# Patient Record
Sex: Male | Born: 1935 | Race: White | Hispanic: No | Marital: Married | Smoking: Former smoker
Health system: Southern US, Community
[De-identification: ages and names within clinical notes are randomized; demographics above are authoritative.]

## PROBLEM LIST (undated history)

## (undated) DIAGNOSIS — N289 Disorder of kidney and ureter, unspecified: Secondary | ICD-10-CM

## (undated) DIAGNOSIS — I472 Ventricular tachycardia: Secondary | ICD-10-CM

## (undated) DIAGNOSIS — I77810 Thoracic aortic ectasia: Secondary | ICD-10-CM

## (undated) DIAGNOSIS — R413 Other amnesia: Secondary | ICD-10-CM

## (undated) DIAGNOSIS — I739 Peripheral vascular disease, unspecified: Secondary | ICD-10-CM

## (undated) DIAGNOSIS — E785 Hyperlipidemia, unspecified: Secondary | ICD-10-CM

## (undated) DIAGNOSIS — R7989 Other specified abnormal findings of blood chemistry: Secondary | ICD-10-CM

## (undated) DIAGNOSIS — C3430 Malignant neoplasm of lower lobe, unspecified bronchus or lung: Principal | ICD-10-CM

## (undated) DIAGNOSIS — I10 Essential (primary) hypertension: Secondary | ICD-10-CM

## (undated) DIAGNOSIS — I2510 Atherosclerotic heart disease of native coronary artery without angina pectoris: Secondary | ICD-10-CM

## (undated) DIAGNOSIS — Z950 Presence of cardiac pacemaker: Secondary | ICD-10-CM

## (undated) DIAGNOSIS — IMO0002 Reserved for concepts with insufficient information to code with codable children: Secondary | ICD-10-CM

## (undated) DIAGNOSIS — G4733 Obstructive sleep apnea (adult) (pediatric): Secondary | ICD-10-CM

## (undated) DIAGNOSIS — D649 Anemia, unspecified: Secondary | ICD-10-CM

## (undated) DIAGNOSIS — IMO0001 Reserved for inherently not codable concepts without codable children: Secondary | ICD-10-CM

## (undated) DIAGNOSIS — I495 Sick sinus syndrome: Secondary | ICD-10-CM

## (undated) DIAGNOSIS — Q809 Congenital ichthyosis, unspecified: Secondary | ICD-10-CM

## (undated) DIAGNOSIS — I4891 Unspecified atrial fibrillation: Secondary | ICD-10-CM

## (undated) DIAGNOSIS — M1990 Unspecified osteoarthritis, unspecified site: Secondary | ICD-10-CM

## (undated) DIAGNOSIS — R739 Hyperglycemia, unspecified: Secondary | ICD-10-CM

## (undated) DIAGNOSIS — Z9289 Personal history of other medical treatment: Secondary | ICD-10-CM

## (undated) DIAGNOSIS — H269 Unspecified cataract: Secondary | ICD-10-CM

## (undated) DIAGNOSIS — Z7901 Long term (current) use of anticoagulants: Secondary | ICD-10-CM

## (undated) DIAGNOSIS — I4729 Other ventricular tachycardia: Secondary | ICD-10-CM

## (undated) DIAGNOSIS — I509 Heart failure, unspecified: Secondary | ICD-10-CM

## (undated) DIAGNOSIS — I499 Cardiac arrhythmia, unspecified: Secondary | ICD-10-CM

## (undated) DIAGNOSIS — E669 Obesity, unspecified: Secondary | ICD-10-CM

## (undated) DIAGNOSIS — Z8719 Personal history of other diseases of the digestive system: Secondary | ICD-10-CM

## (undated) DIAGNOSIS — K219 Gastro-esophageal reflux disease without esophagitis: Secondary | ICD-10-CM

## (undated) DIAGNOSIS — K5792 Diverticulitis of intestine, part unspecified, without perforation or abscess without bleeding: Secondary | ICD-10-CM

## (undated) DIAGNOSIS — K432 Incisional hernia without obstruction or gangrene: Secondary | ICD-10-CM

## (undated) DIAGNOSIS — J449 Chronic obstructive pulmonary disease, unspecified: Secondary | ICD-10-CM

## (undated) HISTORY — DX: Other ventricular tachycardia: I47.29

## (undated) HISTORY — DX: Reserved for concepts with insufficient information to code with codable children: IMO0002

## (undated) HISTORY — DX: Presence of cardiac pacemaker: Z95.0

## (undated) HISTORY — DX: Obstructive sleep apnea (adult) (pediatric): G47.33

## (undated) HISTORY — PX: CORONARY ARTERY BYPASS GRAFT: SHX141

## (undated) HISTORY — DX: Disorder of kidney and ureter, unspecified: N28.9

## (undated) HISTORY — DX: Other specified abnormal findings of blood chemistry: R79.89

## (undated) HISTORY — DX: Unspecified atrial fibrillation: I48.91

## (undated) HISTORY — DX: Obesity, unspecified: E66.9

## (undated) HISTORY — PX: OTHER SURGICAL HISTORY: SHX169

## (undated) HISTORY — DX: Sick sinus syndrome: I49.5

## (undated) HISTORY — DX: Ventricular tachycardia: I47.2

## (undated) HISTORY — DX: Hyperglycemia, unspecified: R73.9

## (undated) HISTORY — DX: Hyperlipidemia, unspecified: E78.5

## (undated) HISTORY — DX: Thoracic aortic ectasia: I77.810

## (undated) HISTORY — DX: Reserved for inherently not codable concepts without codable children: IMO0001

## (undated) HISTORY — DX: Other amnesia: R41.3

## (undated) HISTORY — DX: Incisional hernia without obstruction or gangrene: K43.2

## (undated) HISTORY — DX: Peripheral vascular disease, unspecified: I73.9

## (undated) HISTORY — DX: Diverticulitis of intestine, part unspecified, without perforation or abscess without bleeding: K57.92

## (undated) HISTORY — DX: Anemia, unspecified: D64.9

## (undated) HISTORY — DX: Essential (primary) hypertension: I10

## (undated) HISTORY — PX: BACK SURGERY: SHX140

## (undated) HISTORY — PX: HERNIA REPAIR: SHX51

## (undated) HISTORY — DX: Long term (current) use of anticoagulants: Z79.01

## (undated) HISTORY — DX: Atherosclerotic heart disease of native coronary artery without angina pectoris: I25.10

## (undated) HISTORY — DX: Personal history of other medical treatment: Z92.89

## (undated) HISTORY — DX: Congenital ichthyosis, unspecified: Q80.9

## (undated) HISTORY — DX: Chronic obstructive pulmonary disease, unspecified: J44.9

---

## 1973-07-21 DIAGNOSIS — K5792 Diverticulitis of intestine, part unspecified, without perforation or abscess without bleeding: Secondary | ICD-10-CM

## 1973-07-21 HISTORY — DX: Diverticulitis of intestine, part unspecified, without perforation or abscess without bleeding: K57.92

## 1983-07-22 HISTORY — PX: COLON SURGERY: SHX602

## 2007-07-22 HISTORY — PX: CARDIAC CATHETERIZATION: SHX172

## 2008-02-11 ENCOUNTER — Encounter: Payer: Self-pay | Admitting: Internal Medicine

## 2008-02-19 HISTORY — PX: PACEMAKER INSERTION: SHX728

## 2008-03-01 ENCOUNTER — Ambulatory Visit: Payer: Self-pay | Admitting: Internal Medicine

## 2008-03-01 DIAGNOSIS — G4733 Obstructive sleep apnea (adult) (pediatric): Secondary | ICD-10-CM

## 2008-03-01 DIAGNOSIS — I4891 Unspecified atrial fibrillation: Secondary | ICD-10-CM | POA: Insufficient documentation

## 2008-03-01 DIAGNOSIS — J449 Chronic obstructive pulmonary disease, unspecified: Secondary | ICD-10-CM | POA: Insufficient documentation

## 2008-03-06 ENCOUNTER — Encounter: Payer: Self-pay | Admitting: Internal Medicine

## 2008-03-07 ENCOUNTER — Encounter: Payer: Self-pay | Admitting: Internal Medicine

## 2008-03-08 ENCOUNTER — Inpatient Hospital Stay (HOSPITAL_COMMUNITY): Admission: AD | Admit: 2008-03-08 | Discharge: 2008-03-10 | Payer: Self-pay | Admitting: Interventional Cardiology

## 2008-03-10 ENCOUNTER — Encounter (INDEPENDENT_AMBULATORY_CARE_PROVIDER_SITE_OTHER): Payer: Self-pay | Admitting: *Deleted

## 2008-03-10 ENCOUNTER — Telehealth: Payer: Self-pay | Admitting: Internal Medicine

## 2008-03-11 ENCOUNTER — Inpatient Hospital Stay (HOSPITAL_COMMUNITY): Admission: EM | Admit: 2008-03-11 | Discharge: 2008-03-15 | Payer: Self-pay | Admitting: Emergency Medicine

## 2008-03-13 ENCOUNTER — Encounter (INDEPENDENT_AMBULATORY_CARE_PROVIDER_SITE_OTHER): Payer: Self-pay | Admitting: Cardiology

## 2008-03-13 ENCOUNTER — Ambulatory Visit: Payer: Self-pay | Admitting: Surgery

## 2008-03-14 ENCOUNTER — Ambulatory Visit: Payer: Self-pay | Admitting: Internal Medicine

## 2008-03-20 ENCOUNTER — Telehealth (INDEPENDENT_AMBULATORY_CARE_PROVIDER_SITE_OTHER): Payer: Self-pay | Admitting: *Deleted

## 2008-03-21 ENCOUNTER — Ambulatory Visit: Payer: Self-pay | Admitting: Internal Medicine

## 2008-03-21 DIAGNOSIS — R0602 Shortness of breath: Secondary | ICD-10-CM | POA: Insufficient documentation

## 2008-03-28 ENCOUNTER — Encounter: Payer: Self-pay | Admitting: Internal Medicine

## 2008-03-29 ENCOUNTER — Telehealth: Payer: Self-pay | Admitting: Internal Medicine

## 2008-04-04 ENCOUNTER — Ambulatory Visit (HOSPITAL_BASED_OUTPATIENT_CLINIC_OR_DEPARTMENT_OTHER): Admission: RE | Admit: 2008-04-04 | Discharge: 2008-04-04 | Payer: Self-pay | Admitting: Internal Medicine

## 2008-04-04 ENCOUNTER — Telehealth (INDEPENDENT_AMBULATORY_CARE_PROVIDER_SITE_OTHER): Payer: Self-pay | Admitting: *Deleted

## 2008-04-04 ENCOUNTER — Encounter: Payer: Self-pay | Admitting: Internal Medicine

## 2008-04-08 ENCOUNTER — Ambulatory Visit: Payer: Self-pay | Admitting: Internal Medicine

## 2008-04-28 ENCOUNTER — Ambulatory Visit: Payer: Self-pay | Admitting: Internal Medicine

## 2008-05-09 DIAGNOSIS — E785 Hyperlipidemia, unspecified: Secondary | ICD-10-CM

## 2008-05-15 ENCOUNTER — Encounter: Payer: Self-pay | Admitting: Internal Medicine

## 2008-06-05 ENCOUNTER — Ambulatory Visit: Payer: Self-pay | Admitting: Internal Medicine

## 2008-06-13 ENCOUNTER — Encounter: Payer: Self-pay | Admitting: Internal Medicine

## 2008-07-06 ENCOUNTER — Encounter: Payer: Self-pay | Admitting: Internal Medicine

## 2008-07-07 ENCOUNTER — Telehealth (INDEPENDENT_AMBULATORY_CARE_PROVIDER_SITE_OTHER): Payer: Self-pay | Admitting: *Deleted

## 2008-07-10 ENCOUNTER — Telehealth: Payer: Self-pay | Admitting: Internal Medicine

## 2008-07-10 ENCOUNTER — Ambulatory Visit: Payer: Self-pay | Admitting: Internal Medicine

## 2008-07-18 ENCOUNTER — Telehealth: Payer: Self-pay | Admitting: Internal Medicine

## 2008-07-19 ENCOUNTER — Telehealth: Payer: Self-pay | Admitting: Internal Medicine

## 2008-07-19 ENCOUNTER — Encounter: Payer: Self-pay | Admitting: Internal Medicine

## 2008-07-20 ENCOUNTER — Encounter: Payer: Self-pay | Admitting: Internal Medicine

## 2008-07-25 ENCOUNTER — Inpatient Hospital Stay (HOSPITAL_COMMUNITY): Admission: EM | Admit: 2008-07-25 | Discharge: 2008-07-27 | Payer: Self-pay | Admitting: Emergency Medicine

## 2008-07-29 ENCOUNTER — Encounter: Payer: Self-pay | Admitting: Internal Medicine

## 2008-08-15 ENCOUNTER — Encounter: Payer: Self-pay | Admitting: Internal Medicine

## 2008-10-10 ENCOUNTER — Encounter: Admission: RE | Admit: 2008-10-10 | Discharge: 2008-10-10 | Payer: Self-pay | Admitting: Gastroenterology

## 2008-10-20 ENCOUNTER — Encounter: Admission: RE | Admit: 2008-10-20 | Discharge: 2008-10-20 | Payer: Self-pay | Admitting: Gastroenterology

## 2008-12-04 ENCOUNTER — Ambulatory Visit: Payer: Self-pay | Admitting: Internal Medicine

## 2008-12-05 ENCOUNTER — Ambulatory Visit (HOSPITAL_BASED_OUTPATIENT_CLINIC_OR_DEPARTMENT_OTHER): Admission: RE | Admit: 2008-12-05 | Discharge: 2008-12-05 | Payer: Self-pay | Admitting: Otolaryngology

## 2008-12-05 ENCOUNTER — Encounter (INDEPENDENT_AMBULATORY_CARE_PROVIDER_SITE_OTHER): Payer: Self-pay | Admitting: Otolaryngology

## 2009-05-14 ENCOUNTER — Encounter: Payer: Self-pay | Admitting: Internal Medicine

## 2009-05-21 ENCOUNTER — Encounter: Payer: Self-pay | Admitting: Internal Medicine

## 2009-06-04 ENCOUNTER — Ambulatory Visit: Payer: Self-pay | Admitting: Internal Medicine

## 2009-07-30 ENCOUNTER — Encounter: Payer: Self-pay | Admitting: Internal Medicine

## 2009-07-31 ENCOUNTER — Encounter: Payer: Self-pay | Admitting: Internal Medicine

## 2009-08-12 ENCOUNTER — Telehealth: Payer: Self-pay | Admitting: Internal Medicine

## 2009-11-05 ENCOUNTER — Encounter: Payer: Self-pay | Admitting: Internal Medicine

## 2009-12-03 ENCOUNTER — Ambulatory Visit: Payer: Self-pay | Admitting: Internal Medicine

## 2009-12-03 DIAGNOSIS — E662 Morbid (severe) obesity with alveolar hypoventilation: Secondary | ICD-10-CM | POA: Insufficient documentation

## 2009-12-23 ENCOUNTER — Encounter: Payer: Self-pay | Admitting: Internal Medicine

## 2009-12-25 ENCOUNTER — Encounter: Payer: Self-pay | Admitting: Internal Medicine

## 2010-01-10 ENCOUNTER — Encounter: Payer: Self-pay | Admitting: Internal Medicine

## 2010-01-11 ENCOUNTER — Ambulatory Visit: Payer: Self-pay | Admitting: Internal Medicine

## 2010-05-15 ENCOUNTER — Ambulatory Visit: Payer: Self-pay | Admitting: Internal Medicine

## 2010-07-24 ENCOUNTER — Encounter (HOSPITAL_COMMUNITY)
Admission: RE | Admit: 2010-07-24 | Discharge: 2010-08-20 | Payer: Self-pay | Source: Home / Self Care | Attending: Internal Medicine | Admitting: Internal Medicine

## 2010-08-19 ENCOUNTER — Encounter: Payer: Self-pay | Admitting: Internal Medicine

## 2010-08-20 NOTE — Assessment & Plan Note (Signed)
Summary: rov 4 months///kp   Copy to:  Dr. Harvin Hazel, [place] Primary Provider/Referring Provider:  Pete Glatter  CC:  4 month follow up visit-COPD and Sleep Apnea. Breathing doing okay-SOB at times with activity; Using BIPAP machine every night and pressure is okay per pt..  History of Present Illness: Dec 03, 2009- COPD, OSA, CAD See appended note completion. He says he is using BiPAP with oxygen every night and sleeps well with it. He challenges need for oxygen during the day. He notrices dyspnea only with sustained exertion. Denies any cough or wheeze, chest pain, palpitation or syncope.  January 11, 2010- COPD, OSA, CAD Feels breathing has been a little better.  continues bipap regularly- helps and is comfortable.Back pain limitis his walking. PFT- Mod obstruction, insig response to dilator. FEV1/FVC 0.59; FEV1 1.35/ 58% - 92%, 91%, 96% 288 meters, complained of back pain and SOB. CXR- 5/16- Stable, with pacemaker, NAD  May 15, 2010- COPD, OSA, CAD/ pacer............................Marland Kitchenwife here Nurse-CC: 4 month follow up visit-COPD and Sleep Apnea. Breathing doing okay-SOB at times with activity; Using BIPAP machine every night, pressure is okay per pt. He feels he is getting along ok. Admits he is overweight and that he lacks the initiative to do much about it. Just had Echo and Nuclear Stress Test by Dr Mayford Knife. They understand results were acceptable. He gave out easily and needed to pace himself recently unloading the car. Had flu vax.    Preventive Screening-Counseling & Management  Alcohol-Tobacco     Smoking Status: quit     Year Quit: 1994  Current Medications (verified): 1)  Symbicort 160-4.5 Mcg/act  Aero (Budesonide-Formoterol Fumarate) .... 2 Puffs Two Times A Day and Rinse Mouth 2)  Spiriva Handihaler 18 Mcg  Caps (Tiotropium Bromide Monohydrate) .... Inhale 1 Capsule Once Daily 3)  Bipap 11/9 [facility] 4)  Lipitor 20 Mg Tabs (Atorvastatin Calcium) .... Take 1  By Mouth Once Daily 5)  Coumadin 2.5 Mg  Tabs (Warfarin Sodium) .... Take As Directed 6)  Furosemide 80 Mg  Tabs (Furosemide) .... Take 1 By Mouth in The Am 7)  Klor-Con 10 10 Meq  Cr-Tabs (Potassium Chloride) .... Take 1 By Mouth Once Daily 8)  Sotalol Hcl 120 Mg Tabs (Sotalol Hcl) .... Take 1 By Mouth Once Daily 9)  Cardizem La 180 Mg Xr24h-Tab (Diltiazem Hcl Coated Beads) .... Take 1 Tablet By Mouth Once A Day 10)  Oxygen 2 L/m Continuous and Portable-  [facility]. 11)  Nebulizer For [employer] .... As Directed 12)  Protonix 40 Mg Tbec (Pantoprazole Sodium) .... Take 1 By Mouth Once Daily 13)  Trilipix 45 Mg Cpdr (Choline Fenofibrate) .... Take 1 By Mouth Once Daily 14)  Ferretts 325 (106 Fe) Mg Tabs (Ferrous Fumarate) .... Take 1 By Mouth Once Daily 15)  Vitamin C 500 Mg Tabs (Ascorbic Acid) .... Take 1 By Mouth Once Daily  Allergies (verified): No Known Drug Allergies  Past History:  Past Medical History: Last updated: 01/11/2010 Sleep Apnea- 04/04/08 NPSG AHI 14.5, 3 L/M suppl O2 for nadir 86% Atrial Fibrillation/ coumadin, pacemaker Diverticulitis 1975 Coronary Heart Disease Hypertension COPD   FEV1 1.35/ 58% 01/11/10 Hyperlipidemia  Past Surgical History: Last updated: 03/01/2008 Broken nose CABG 1975, 1994  Family History: Last updated: 17-Dec-2008 Father- died cancer, arrested TB Mother- ASCVD  Social History: Last updated: 2008/12/17 Patient states former smoker.  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  The patient denies shortness of breath at rest, productive cough, non-productive cough, coughing up blood, chest pain, irregular heartbeats, acid heartburn, indigestion, loss of appetite, weight change, abdominal pain, difficulty swallowing, sore throat, tooth/dental problems, headaches, nasal  congestion/difficulty breathing through nose, sneezing, itching, ear ache, anxiety, rash, change in color of mucus, and fever.    Vital Signs:  Patient profile:   77 year old male Height:      67 inches Weight:      249.50 pounds BMI:     39.22 O2 Sat:      96 % on Room air Pulse rate:   63 / minute BP sitting:   124 / 62  (left arm) Cuff size:   large  Vitals Entered By: Reynaldo Minium CMA (May 15, 2010 9:16 AM)  O2 Flow:  Room air CC: 4 month follow up visit-COPD and Sleep Apnea. Breathing doing okay-SOB at times with activity; Using BIPAP machine every night, pressure is okay per pt.   Physical Exam  Additional Exam:  General: A/Ox3; pleasant and cooperative, NAD,  significantly overweight, Asleep when I wlked into room- woke easily SKIN: icthyosis NODES: no lymphadenopathy HEENT: [place]/AT, EOM- WNL, Conjuctivae- clear, PERRLA, TM-hearing aids, Nose- clear, Throat- clear and wnl, Mallampati II-III NECK: Supple w/ fair ROM, JVD- none, normal carotid impulses w/o bruits Thyroid- CHEST: Clear to P&A, distant, slow expiration. Shallow with short sentences. No cough or wheeze. HEART: RRR, no m/g/r heard ABDOMEN: significant girth LFY:BOFB, nl pulses, 1+ edema  NEURO: Grossly intact to observation      Impression & Recommendations:  Problem # 1:  COPD (ICD-496) We discussed this in light of his recent PFT and cardiac tests.  He might benefit from Pulmonary Rehab and we will refer him.  Problem # 2:  SLEEP APNEA (ICD-780.57) He remains compliant and comfortable with his BIPAP plus oxygen at night and has used it a few times for daytime respite. Weight loss would really help.  Problem # 3:  OBESITY HYPOVENTILATION SYNDROME (ICD-278.8) Watching him- the mechanical impact of his large girth is obvious. We used this to build on potential benefit of rehab.   Medications Added to Medication List This Visit: 1)  Lipitor 20 Mg Tabs (Atorvastatin calcium) .... Take 1 by mouth once  daily 2)  Furosemide 80 Mg Tabs (Furosemide) .... Take 1 by mouth in the am 3)  Sotalol Hcl 120 Mg Tabs (Sotalol hcl) .... Take 1 by mouth once daily  Other Orders: Est. Patient Level IV (51025) Rehabilitation Referral (Rehab)  Patient Instructions: 1)  Please schedule a follow-up appointment in 6 months. 2)  See [facility] to discuss Pulmonary Rehab program at [facility].

## 2010-08-20 NOTE — Letter (Signed)
Summary: CMN for Oxygen/[facility]  CMN for Oxygen/[facility]   Imported By: Sherian Rein 12/27/2009 10:59:09  _____________________________________________________________________  External Attachment:    Type:   Image     Comment:   External Document

## 2010-08-20 NOTE — Medication Information (Signed)
Summary: Order for Nebulizer & Meds/[facility]  Order for Nebulizer & Meds/[facility]   Imported By: Sherian Rein 08/02/2009 14:13:35  _____________________________________________________________________  External Attachment:    Type:   Image     Comment:   External Document

## 2010-08-20 NOTE — Assessment & Plan Note (Signed)
Summary: ROV AFTER SMW/PFT ///kp   Copy to:  Dr. Harvin Hazel, [place] Primary Provider/Referring Provider:  Pete Glatter  CC:  Follow up visit-review and PFT.[name] Gill  History of Present Illness: June 04, 2009- COPD, OSA, CAD ........................................[name] Kitchenwife here Feels he is doing very well and denies acute issues through the summer. He only uses oxygen through his BiPAP for sleep and naps. He rarely used portable oxygen.With exertion he will get breathless, but recover quickly. Mild increase in clear phlegm over past month. Has macular degeration and we discussed Spiriva, but his opthalmologist hasn't expressed concern. he ra  Had flu vax. Wife denies snore through BiPAP. Sleep is restless, waking to bathroom every few hours. On quinine water that  helps leg cramps. Dry mouth despite humidifier.  Dec 03, 2009- COPD, OSA, CAD See appended note completion. He says he is using BiPAP with oxygen every night and sleeps well with it. He challenges need for oxygen during the day. He notrices dyspnea only with sustained exertion. Denies any cough or wheeze, chest pain, palpitation or syncope.  January 11, 2010- COPD, OSA, CAD Feels breathing has been a little better.  continues bipap regularly- helps and is comfortable.Back pain limitis his walking. PFT- Mod obstruction, insig response to dilator. FEV1/FVC 0.59; FEV1 1.35/ 58% - 92%, 91%, 96% 288 meters, complained of back pain and SOB. CXR- 5/16- Stable, with pacemaker, NAD     Preventive Screening-Counseling & Management  Alcohol-Tobacco     Smoking Status: quit     Year Quit: 1994  Current Medications (verified): 1)  Symbicort 160-4.5 Mcg/act  Aero (Budesonide-Formoterol Fumarate) .... 2 Puffs Two Times A Day and Rinse Mouth 2)  Spiriva Handihaler 18 Mcg  Caps (Tiotropium Bromide Monohydrate) .... Inhale 1 Capsule Once Daily 3)  Bipap 11/9 [facility] 4)  Lipitor 80 Mg Tabs (Atorvastatin Calcium) .... 1/2 Tab Once  Daily 5)  Coumadin 2.5 Mg  Tabs (Warfarin Sodium) .... Take As Directed 6)  Furosemide 80 Mg  Tabs (Furosemide) .... Take 1 By Mouth in The Am and 1/2 Tablet in The Pm 7)  Klor-Con 10 10 Meq  Cr-Tabs (Potassium Chloride) .... Take 1 By Mouth Once Daily 8)  Sotalol Hcl 120 Mg Tabs (Sotalol Hcl) .... Take 1 1/2 By Mouth Two Times A Day 9)  Cardizem La 180 Mg Xr24h-Tab (Diltiazem Hcl Coated Beads) .... Take 1 Tablet By Mouth Once A Day 10)  Oxygen 2 L/m Continuous and Portable-  [facility]. 11)  Nebulizer For [employer] .... As Directed 12)  Protonix 40 Mg Tbec (Pantoprazole Sodium) .... Take 1 By Mouth Once Daily 13)  Trilipix 45 Mg Cpdr (Choline Fenofibrate) .... Take 1 By Mouth Once Daily 14)  Ferretts 325 (106 Fe) Mg Tabs (Ferrous Fumarate) .... Take 1 By Mouth Once Daily 15)  Vitamin C 500 Mg Tabs (Ascorbic Acid) .... Take 1 By Mouth Once Daily  Allergies (verified): No Known Drug Allergies  Past History:  Past Surgical History: Last updated: 03/01/2008 Broken nose CABG 1975, 1994  Family History: Last updated: 18-Dec-2008 Father- died cancer, arrested TB Mother- ASCVD  Social History: Last updated: 12/18/08 Patient states former smoker.  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  The patient denies shortness of breath at rest, productive cough, non-productive cough, coughing up blood, chest pain, irregular heartbeats, acid heartburn, indigestion, loss of appetite, weight change, abdominal pain, difficulty swallowing, sore throat, tooth/dental problems, headaches, nasal  congestion/difficulty breathing through nose, and sneezing.    Vital Signs:  Patient profile:   77 year old male Height:      67 inches Weight:      250 pounds BMI:     39.30 O2 Sat:      90 % on Room air Pulse rate:   64 / minute  Vitals Entered By: Reynaldo Minium CMA (January 11, 2010 10:49 AM)  O2 Flow:  Room air CC: Follow up visit-review and PFT.   Physical Exam  Additional Exam:  General: A/Ox3; pleasant and cooperative, NAD,  significantly overweight, Asleep when I wlked into room- woke easily SKIN: icthyosis NODES: no lymphadenopathy HEENT: [place]/AT, EOM- WNL, Conjuctivae- clear, PERRLA, TM-hearing aids, Nose- clear, Throat- clear and wnl, Mallampati II-III NECK: Supple w/ fair ROM, JVD- none, normal carotid impulses w/o bruits Thyroid- CHEST: Clear to P&A, distant, slow expiration. Shallow with short sentences HEART: RRR, no m/g/r heard ABDOMEN: significant girth ZOX:WRUE, nl pulses, 1+ edema  NEURO: Grossly intact to observation      Impression & Recommendations:  Problem # 1:  COPD (ICD-496) Fixed obstructive disease with some emphysema.  Peripheral edema may reflect CHF, weight, peripheral venous insufficiency and probably some degree of pul htn   Problem # 2:  DYSPNEA (ICD-786.05) multifactorial with components of obesity and back pain limitiation. The primary problem is moderate fixed obstructive disease.  Problem # 3:  SLEEP APNEA (ICD-780.57)  Good compliance and control Noted his being asleep as i entered room and he said that the PFT and walking test "wore me out".  Medications Added to Medication List This Visit: 1)  Ferretts 325 (106 Fe) Mg Tabs (Ferrous fumarate) .... Take 1 by mouth once daily 2)  Vitamin C 500 Mg Tabs (Ascorbic acid) .... Take 1 by mouth once daily  Other Orders: Est. Patient Level IV (45409)  Patient Instructions: 1)  Please schedule a follow-up appointment in 4 months. 2)  Continue BiPAP and oxygen during sleep, and use  the oxygen as needed during the day 3)  Any weight loss will help make more room for your lungs to move.

## 2010-08-20 NOTE — Letter (Signed)
Summary: CMN/[facility]  CMN/[facility]   Imported By: Lester [place]wich 12/28/2009 09:22:19  _____________________________________________________________________  External Attachment:    Type:   Image     Comment:   External Document

## 2010-08-20 NOTE — Miscellaneous (Signed)
Summary: Orders Update pft charges  Clinical Lists Changes  Orders: Added new Service order of Carbon Monoxide diffusing w/capacity (94720) - Signed Added new Service order of Lung Volumes (94240) - Signed Added new Service order of Spirometry (Pre & Post) (94060) - Signed 

## 2010-08-20 NOTE — Assessment & Plan Note (Signed)
Summary: SIX MIN WALK- PULM STRESS TEST  Nurse Visit   Vital Signs:  Patient profile:   77 year old male Pulse rate:   58 / minute BP sitting:   120 / 70  Medications Prior to Update: 1)  Symbicort 160-4.5 Mcg/act  Aero (Budesonide-Formoterol Fumarate) .... 2 Puffs Two Times A Day and Rinse Mouth 2)  Spiriva Handihaler 18 Mcg  Caps (Tiotropium Bromide Monohydrate) .... Inhale 1 Capsule Once Daily 3)  Bipap 11/9 [facility] 4)  Lipitor 80 Mg Tabs (Atorvastatin Calcium) .... 1/2 Tab Once Daily 5)  Coumadin 2.5 Mg  Tabs (Warfarin Sodium) .... Take As Directed 6)  Furosemide 80 Mg  Tabs (Furosemide) .... Take 1 By Mouth in The Am and 1/2 Tablet in The Pm 7)  Klor-Con 10 10 Meq  Cr-Tabs (Potassium Chloride) .... Take 1 By Mouth Once Daily 8)  Sotalol Hcl 120 Mg Tabs (Sotalol Hcl) .... Take 1 1/2 By Mouth Two Times A Day 9)  Cardizem La 180 Mg Xr24h-Tab (Diltiazem Hcl Coated Beads) .... Take 1 Tablet By Mouth Once A Day 10)  Oxygen 2 L/m Continuous and Portable-  [facility]. 11)  Nebulizer For [employer] .... As Directed 12)  Protonix 40 Mg Tbec (Pantoprazole Sodium) .... Take 1 By Mouth Once Daily 13)  Trilipix 45 Mg Cpdr (Choline Fenofibrate) .... Take 1 By Mouth Once Daily  Allergies: No Known Drug Allergies  Orders Added: 1)  Pulmonary Stress (6 min walk) [94620]   Six Minute Walk Test Medications taken before test(dose and time): 1)  Symbicort 160-4.5 Mcg/act  Aero (Budesonide-Formoterol Fumarate) .... 2 Puffs Two Times A Day and Rinse Mouth 2)  Spiriva Handihaler 18 Mcg  Caps (Tiotropium Bromide Monohydrate) .... Inhale 1 Capsule Once Daily 4)  Lipitor 80 Mg Tabs (Atorvastatin Calcium) .... 1/2 Tab Once Daily 5)  Coumadin 2.5 Mg  Tabs (Warfarin Sodium) .... Take As Directed 6)  Furosemide 80 Mg  Tabs (Furosemide) .... Take 1 By Mouth in The Am and 1/2 Tablet in The Pm 7)  Klor-Con 10 10 Meq  Cr-Tabs (Potassium Chloride) .... Take 1 By Mouth Once Daily 8)  Sotalol Hcl 120 Mg Tabs  (Sotalol Hcl) .... Take 1 1/2 By Mouth Two Times A Day 9)  Cardizem La 180 Mg Xr24h-Tab (Diltiazem Hcl Coated Beads) .... Take 1 Tablet By Mouth Once A Day 11)  Nebulizer For [employer] .... As Directed 12)  Protonix 40 Mg Tbec (Pantoprazole Sodium) .... Take 1 By Mouth Once Daily 13)  Trilipix 45 Mg Cpdr (Choline Fenofibrate) .... Take 1 By Mouth Once Daily 14)  Vitamin C- (unknown dosage) 1 tab daily per pt today 15)  Iron 325mg  tab per pt today PT TOOK THESE MEDS AT 8:00AM TODAY Supplemental oxygen during the test: No  Lap counter(place a tick mark inside a square for each lap completed) lap 1 complete  lap 2 complete   lap 3 complete   lap 4 complete  lap 5 complete  lap 6 complete   Baseline  BP sitting: 120/ 70 Heart rate: 58 Dyspnea ( Borg scale) 0 Fatigue (Borg scale) 0 SPO2 92  End Of Test  BP sitting: 134/ 72 Heart rate: 100 Dyspnea ( Borg scale) 4 Fatigue (Borg scale) 3 SPO2 91  2 Minutes post  BP sitting: 122/ 70 Heart rate: 64 SPO2 96  Reason: Pt stopped for a 1 min. break- SOB/ back pain. Continued afterwards and completed test.  Interpretation: Number of laps  6 X  48 meters =   288 meters =    288 meters   Total distance walked in six minutes: 288 meters  Tech ID: Tivis Ringer, CNA (January 11, 2010 9:52 AM) Jeremy Johann Comments Pt took a 1 min break. c/o SOB/ back pain. He continued afterwards and completed test.

## 2010-08-20 NOTE — Letter (Signed)
Summary: CMN/[facility]  CMN/[facility]   Imported By: Lester [place] 11/14/2009 08:39:02  _____________________________________________________________________  External Attachment:    Type:   Image     Comment:   External Document

## 2010-08-20 NOTE — Letter (Signed)
Summary: CMN for Oxygen/[facility]  CMN for Oxygen/[facility]   Imported By: Sherian Rein 01/23/2010 13:59:56  _____________________________________________________________________  External Attachment:    Type:   Image     Comment:   External Document

## 2010-08-20 NOTE — Assessment & Plan Note (Signed)
Summary: rov 6 months///kp   Copy to:  Dr. Harvin Hazel, [place] Primary Provider/Referring Provider:  Pete Glatter  CC:  6 month follow up.  Pt states breathing is doing "very good."  Denies wheezing, chest tightness, and cough.  Pt states he is wearing bipap everynight for approx 5 1/2 hours each night.  Denies problem with mask and pressure.  [name]  History of Present Illness: 01-02-2009- COPD, OSA, CAD     Wife here Has Oxygen 2l, not used for short trips out of house and when inactive. Denies fever, purulent, bloody, chest pain, palpitation. For local anesth tomorrow to remove lump from cheek. Needs refill rescue inhaler. Asks about using nebulizer less, using rescue inhaler first as needed. Was hosp needing transfusion for anemia on coumadin and aspirin- source never found and counts rec  June 04, 2009- COPD, OSA, CAD ........................................Marland Kitchenwife here Feels he is doing very well and denies acute issues through the summer. He only uses oxygen through his BiPAP for sleep and naps. He rarely used portable oxygen.With exertion he will get breathless, but recover quickly. Mild increase in clear phlegm over past month. Has macular degeration and we discussed Spiriva, but his opthalmologist hasn't expressed concern. he ra  Had flu vax. Wife denies snore through BiPAP. Sleep is restless, waking to bathroom every few hours. On quinine water that  helps leg cramps. Dry mouth despite humidifier.  Dec 03, 2009- COPD, OSA, CAD He says he is using BiPAP with oxygen every night and sleeps well with it. He challenges need for oxygen during the day. He notrices dyspnea only with sustained exertion. Denies any cough or wheeze, chest pain, palpitation or syncope.    Current Medications (verified): 1)  Symbicort 160-4.5 Mcg/act  Aero (Budesonide-Formoterol Fumarate) .... 2 Puffs Two Times A Day and Rinse Mouth 2)  Spiriva Handihaler 18 Mcg  Caps (Tiotropium Bromide Monohydrate) .... Inhale  1 Capsule Once Daily 3)  Bipap 11/9 [facility] 4)  Lipitor 80 Mg Tabs (Atorvastatin Calcium) .... 1/2 Tab Once Daily 5)  Coumadin 2.5 Mg  Tabs (Warfarin Sodium) .... Take As Directed 6)  Furosemide 80 Mg  Tabs (Furosemide) .... Take 1 By Mouth in The Am and 1/2 Tablet in The Pm 7)  Klor-Con 10 10 Meq  Cr-Tabs (Potassium Chloride) .... Take 1 By Mouth Once Daily 8)  Sotalol Hcl 120 Mg Tabs (Sotalol Hcl) .... Take 1 1/2 By Mouth Two Times A Day 9)  Cardizem La 180 Mg Xr24h-Tab (Diltiazem Hcl Coated Beads) .... Take 1 Tablet By Mouth Once A Day 10)  Oxygen 2 L/m Continuous and Portable-  [facility]. 11)  Nebulizer For [employer] .... As Directed 12)  Protonix 40 Mg Tbec (Pantoprazole Sodium) .... Take 1 By Mouth Once Daily 13)  Trilipix 45 Mg Cpdr (Choline Fenofibrate) .... Take 1 By Mouth Once Daily  Allergies (verified): No Known Drug Allergies  Past History:  Past Medical History: Last updated: 07/29/2008 Sleep Apnea- 04/04/08 NPSG AHI 14.5, 3 L/M suppl O2 for nadir 86% Atrial Fibrillation/ coumadin, pacemaker Diverticulitis 1975 Coronary Heart Disease Hypertension COPD Hyperlipidemia  See appended document signed prematurely and appended to complete note.  Past Surgical History: Last updated: 03/01/2008 Broken nose CABG 1975, 1994  Family History: Last updated: 01/02/09 Father- died cancer, arrested TB Mother- ASCVD  Social History: Last updated: 02-Jan-2009 Patient states former smoker.  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    Vital Signs:  Patient profile:   77 year old  male Height:      67 inches Weight:      250 pounds BMI:     39.30 O2 Sat:      88 % on Room air Pulse rate:   77 / minute BP sitting:   124 / 68  (right arm) Cuff size:   regular  Vitals Entered By: Gweneth Dimitri RN (Dec 03, 2009 10:49 AM)  O2 Flow:  Room air  O2 Sat Comments Pt arrived to exam room with o2 sat 88% RA.  After resting for a few minutes, o2 sat increased to 98% RA with pulse of 68.  Gweneth Dimitri RN  Dec 03, 2009 10:50 AM  CC: 6 month follow up.  Pt states breathing is doing "very good."  Denies wheezing, chest tightness, cough.  Pt states he is wearing bipap everynight for approx 5 1/2 hours each night.  Denies problem with mask and pressure.   Comments Medications reviewed with patient Daytime contact number verified with patient. Gweneth Dimitri RN  Dec 03, 2009 10:52 AM    Physical Exam  Additional Exam:  General: A/Ox3; pleasant and cooperative, NAD,  significantly overweight,  SKIN: icthyosis NODES: no lymphadenopathy HEENT: [place]/AT, EOM- WNL, Conjuctivae- clear, PERRLA, TM-hearing aids, Nose- clear, Throat- clear and wnl, Mallampati II-III NECK: Supple w/ fair ROM, JVD- none, normal carotid impulses w/o bruits Thyroid- CHEST: Clear to P&A, distant, slow expiration. Shallow with short senteces- he is unaware. HEART: RRR, no m/g/r heard ABDOMEN: significant girth UEA:VWUJ, nl pulses, no edema  NEURO: Grossly intact to observation      Impression & Recommendations:  Problem # 1:  SLEEP APNEA (ICD-780.57)  Good compliance and control with BiPAP plus oxygen. Used every night all night.  Problem # 2:  DYSPNEA (ICD-786.05) Obesity hypoventilation. He is desensitized to hypoxia and doesn't recognize resting hypoxemia, although he arrived with O2 sat 88% on room air. We discussed oxygen therapy, goals and indications. We will update CXR, PFT and exercise oxygen evaluation.  Medications Added to Medication List This Visit: 1)  Lipitor 80 Mg Tabs  (Atorvastatin calcium) .... 1/2 tab once daily 2)  Sotalol Hcl 120 Mg Tabs (Sotalol hcl) .... Take 1 1/2 by mouth two times a day  Other Orders: Est. Patient Level III (99213) T-2 View CXR (71020TC) DME Referral (DME)  Patient Instructions: 1)  Please schedule a follow-up appointment in 1 month. 2)  We will update your oxygen order with [facility] to provide a more  portable oxygen supply. i think you should wear oxygen for sleep as you are doing, and also when you are active, up and exerting yourself. Set at 2 L/M. 3)  Weight loss is the single most imprtant thing you can do to get your breathing better. 4)  Schedule PFT with on room air 5)  A chest x-ray has been recommended.  Your imaging study may require preauthorization.    Immunization History:  Influenza Immunization History:    Influenza:  historical (05/21/2009)

## 2010-08-20 NOTE — Progress Notes (Signed)
Summary: Autotitrated BiPAP to 11/9  Phone Note Other Incoming   Summary of Call: BIPAP autotitration  from 1/4-1/10/11 indicates good compliance and suggests changing pressures from 13/5 to 11/9 might work better. This range gave AHI 1.6. We will have [facility] make that adjustment.  Follow-up for Phone Call        Pt is aware of autotitration report and is aware that an order has been sent to [facility] to change pressure of bipap. Pt is aware that once pressure has been changed to call us and let Dr. Maple Hudson know if he is having any problems tolerating pressure. Pt voiced understanding. Rhonda Cobb  August 14, 2009 9:00 AM     New/Updated Medications: * BIPAP 11/9 [facility]

## 2010-08-22 ENCOUNTER — Encounter (HOSPITAL_COMMUNITY): Payer: Medicare Other | Attending: Internal Medicine

## 2010-08-22 DIAGNOSIS — E678 Other specified hyperalimentation: Secondary | ICD-10-CM | POA: Insufficient documentation

## 2010-08-22 DIAGNOSIS — R0989 Other specified symptoms and signs involving the circulatory and respiratory systems: Secondary | ICD-10-CM | POA: Insufficient documentation

## 2010-08-22 DIAGNOSIS — I2510 Atherosclerotic heart disease of native coronary artery without angina pectoris: Secondary | ICD-10-CM | POA: Insufficient documentation

## 2010-08-22 DIAGNOSIS — E785 Hyperlipidemia, unspecified: Secondary | ICD-10-CM | POA: Insufficient documentation

## 2010-08-22 DIAGNOSIS — J449 Chronic obstructive pulmonary disease, unspecified: Secondary | ICD-10-CM | POA: Insufficient documentation

## 2010-08-22 DIAGNOSIS — R0609 Other forms of dyspnea: Secondary | ICD-10-CM | POA: Insufficient documentation

## 2010-08-22 DIAGNOSIS — J4489 Other specified chronic obstructive pulmonary disease: Secondary | ICD-10-CM | POA: Insufficient documentation

## 2010-08-22 DIAGNOSIS — Z5189 Encounter for other specified aftercare: Secondary | ICD-10-CM | POA: Insufficient documentation

## 2010-08-22 DIAGNOSIS — G4730 Sleep apnea, unspecified: Secondary | ICD-10-CM | POA: Insufficient documentation

## 2010-08-22 DIAGNOSIS — I4891 Unspecified atrial fibrillation: Secondary | ICD-10-CM | POA: Insufficient documentation

## 2010-08-27 ENCOUNTER — Encounter (HOSPITAL_COMMUNITY): Payer: Medicare Other

## 2010-08-29 ENCOUNTER — Encounter (HOSPITAL_COMMUNITY): Payer: Medicare Other

## 2010-09-02 ENCOUNTER — Telehealth (INDEPENDENT_AMBULATORY_CARE_PROVIDER_SITE_OTHER): Payer: Self-pay | Admitting: *Deleted

## 2010-09-03 ENCOUNTER — Encounter (HOSPITAL_COMMUNITY): Payer: Medicare Other

## 2010-09-05 ENCOUNTER — Encounter (HOSPITAL_COMMUNITY): Payer: Medicare Other

## 2010-09-05 NOTE — Letter (Signed)
Summary: [facility]   Imported By: Lester Wineglass 08/27/2010 09:29:01  _____________________________________________________________________  External Attachment:    Type:   Image     Comment:   External Document

## 2010-09-10 ENCOUNTER — Encounter (HOSPITAL_COMMUNITY): Payer: Medicare Other

## 2010-09-11 NOTE — Progress Notes (Signed)
Summary: wants dr youngs opinion regardeing copd study  Phone Note Call from Patient Call back at Home Phone [phone]   Caller: Patient Call For: Young Summary of Call: Patient phoned he received a letter from [employer] wanting to know if he is interested in a study for COPd and he wants to know if this is somethng that Dr Maple Hudson thinks he should participate in. He can be reached at [phone] Initial call taken by: Vedia Coffer,  September 02, 2010 10:07 AM  Follow-up for Phone Call        Pt states he received someinformation about participating in a COPD study through [employer]. Pt wante dto knwo if Dr. Maple Hudson had heard of this study and if he thought it is somethign the pt should participate in. Pt states the website with more info for the study is www.[employer]no.  Please advise. Carron Curie CMA  September 02, 2010 12:53 PM   Additional Follow-up for Phone Call Additional follow up Details #1::        It is ok to participate if he wants to.  Additional Follow-up by: [name] MD,  September 03, 2010 8:36 AM    Additional Follow-up for Phone Call Additional follow up Details #2::    Pt informed ok to partcipate in study per Dr Maple Hudson. Abigail Miyamoto RN  September 03, 2010 8:40 AM

## 2010-09-12 ENCOUNTER — Encounter (HOSPITAL_COMMUNITY): Payer: Medicare Other

## 2010-09-17 ENCOUNTER — Encounter (HOSPITAL_COMMUNITY): Payer: Medicare Other

## 2010-09-19 ENCOUNTER — Encounter (HOSPITAL_COMMUNITY): Payer: Medicare Other | Attending: Internal Medicine

## 2010-09-19 DIAGNOSIS — E678 Other specified hyperalimentation: Secondary | ICD-10-CM | POA: Insufficient documentation

## 2010-09-19 DIAGNOSIS — I2510 Atherosclerotic heart disease of native coronary artery without angina pectoris: Secondary | ICD-10-CM | POA: Insufficient documentation

## 2010-09-19 DIAGNOSIS — G4730 Sleep apnea, unspecified: Secondary | ICD-10-CM | POA: Insufficient documentation

## 2010-09-19 DIAGNOSIS — R0609 Other forms of dyspnea: Secondary | ICD-10-CM | POA: Insufficient documentation

## 2010-09-19 DIAGNOSIS — J4489 Other specified chronic obstructive pulmonary disease: Secondary | ICD-10-CM | POA: Insufficient documentation

## 2010-09-19 DIAGNOSIS — J449 Chronic obstructive pulmonary disease, unspecified: Secondary | ICD-10-CM | POA: Insufficient documentation

## 2010-09-19 DIAGNOSIS — I4891 Unspecified atrial fibrillation: Secondary | ICD-10-CM | POA: Insufficient documentation

## 2010-09-19 DIAGNOSIS — Z5189 Encounter for other specified aftercare: Secondary | ICD-10-CM | POA: Insufficient documentation

## 2010-09-19 DIAGNOSIS — E785 Hyperlipidemia, unspecified: Secondary | ICD-10-CM | POA: Insufficient documentation

## 2010-09-19 DIAGNOSIS — R0989 Other specified symptoms and signs involving the circulatory and respiratory systems: Secondary | ICD-10-CM | POA: Insufficient documentation

## 2010-09-24 ENCOUNTER — Encounter (HOSPITAL_COMMUNITY): Payer: Medicare Other

## 2010-09-26 ENCOUNTER — Encounter (HOSPITAL_COMMUNITY): Payer: Medicare Other

## 2010-10-01 ENCOUNTER — Encounter (HOSPITAL_COMMUNITY): Payer: Medicare Other

## 2010-10-03 ENCOUNTER — Encounter (HOSPITAL_COMMUNITY): Payer: Medicare Other

## 2010-10-08 ENCOUNTER — Encounter (HOSPITAL_COMMUNITY): Payer: Medicare Other

## 2010-10-10 ENCOUNTER — Encounter (HOSPITAL_COMMUNITY): Payer: Medicare Other

## 2010-10-15 ENCOUNTER — Other Ambulatory Visit: Payer: Self-pay | Admitting: Internal Medicine

## 2010-10-15 ENCOUNTER — Encounter (HOSPITAL_COMMUNITY): Payer: Medicare Other

## 2010-10-17 ENCOUNTER — Encounter (HOSPITAL_COMMUNITY): Payer: Medicare Other

## 2010-10-22 ENCOUNTER — Encounter (HOSPITAL_COMMUNITY): Payer: Medicare Other | Attending: Internal Medicine

## 2010-10-22 DIAGNOSIS — E678 Other specified hyperalimentation: Secondary | ICD-10-CM | POA: Insufficient documentation

## 2010-10-22 DIAGNOSIS — R0989 Other specified symptoms and signs involving the circulatory and respiratory systems: Secondary | ICD-10-CM | POA: Insufficient documentation

## 2010-10-22 DIAGNOSIS — I2510 Atherosclerotic heart disease of native coronary artery without angina pectoris: Secondary | ICD-10-CM | POA: Insufficient documentation

## 2010-10-22 DIAGNOSIS — J4489 Other specified chronic obstructive pulmonary disease: Secondary | ICD-10-CM | POA: Insufficient documentation

## 2010-10-22 DIAGNOSIS — J449 Chronic obstructive pulmonary disease, unspecified: Secondary | ICD-10-CM | POA: Insufficient documentation

## 2010-10-22 DIAGNOSIS — E785 Hyperlipidemia, unspecified: Secondary | ICD-10-CM | POA: Insufficient documentation

## 2010-10-22 DIAGNOSIS — Z5189 Encounter for other specified aftercare: Secondary | ICD-10-CM | POA: Insufficient documentation

## 2010-10-22 DIAGNOSIS — R0609 Other forms of dyspnea: Secondary | ICD-10-CM | POA: Insufficient documentation

## 2010-10-22 DIAGNOSIS — I4891 Unspecified atrial fibrillation: Secondary | ICD-10-CM | POA: Insufficient documentation

## 2010-10-22 DIAGNOSIS — G4730 Sleep apnea, unspecified: Secondary | ICD-10-CM | POA: Insufficient documentation

## 2010-10-24 ENCOUNTER — Encounter (HOSPITAL_COMMUNITY): Payer: Medicare Other

## 2010-10-29 ENCOUNTER — Encounter (HOSPITAL_COMMUNITY): Payer: Medicare Other

## 2010-10-31 ENCOUNTER — Encounter (HOSPITAL_COMMUNITY): Payer: Medicare Other

## 2010-11-04 LAB — CBC
HCT: 22.5 % — ABNORMAL LOW (ref 39.0–52.0)
HCT: 27.1 % — ABNORMAL LOW (ref 39.0–52.0)
Hemoglobin: 8.6 g/dL — ABNORMAL LOW (ref 13.0–17.0)
MCHC: 30 g/dL (ref 30.0–36.0)
MCHC: 31.6 g/dL (ref 30.0–36.0)
MCV: 75.7 fL — ABNORMAL LOW (ref 78.0–100.0)
MCV: 77.3 fL — ABNORMAL LOW (ref 78.0–100.0)
MCV: 78.2 fL (ref 78.0–100.0)
Platelets: 331 10*3/uL (ref 150–400)
RBC: 2.97 MIL/uL — ABNORMAL LOW (ref 4.22–5.81)
RBC: 3.4 MIL/uL — ABNORMAL LOW (ref 4.22–5.81)
RBC: 3.47 MIL/uL — ABNORMAL LOW (ref 4.22–5.81)
WBC: 7.4 10*3/uL (ref 4.0–10.5)
WBC: 7.4 10*3/uL (ref 4.0–10.5)

## 2010-11-04 LAB — CROSSMATCH: ABO/RH(D): O NEG

## 2010-11-04 LAB — COMPREHENSIVE METABOLIC PANEL
AST: 12 U/L (ref 0–37)
BUN: 40 mg/dL — ABNORMAL HIGH (ref 6–23)
CO2: 30 mEq/L (ref 19–32)
Calcium: 8.7 mg/dL (ref 8.4–10.5)
Chloride: 102 mEq/L (ref 96–112)
Creatinine, Ser: 2.04 mg/dL — ABNORMAL HIGH (ref 0.4–1.5)
GFR calc Af Amer: 38 mL/min — ABNORMAL LOW (ref >60)
GFR calc non Af Amer: 32 mL/min — ABNORMAL LOW (ref >60)
Glucose, Bld: 96 mg/dL (ref 70–99)
Total Bilirubin: 0.6 mg/dL (ref 0.3–1.2)

## 2010-11-04 LAB — DIFFERENTIAL
Basophils Absolute: 0.1 10*3/uL (ref 0.0–0.1)
Eosinophils Relative: 3 % (ref 0–5)
Lymphocytes Relative: 12 % (ref 12–46)
Monocytes Relative: 11 % (ref 3–12)
Neutrophils Relative %: 73 % (ref 43–77)

## 2010-11-04 LAB — APTT: aPTT: 43 seconds — ABNORMAL HIGH (ref 24–37)

## 2010-11-04 LAB — IRON AND TIBC
Iron: 12 ug/dL — ABNORMAL LOW (ref 42–135)
UIBC: 398 ug/dL

## 2010-11-04 LAB — BASIC METABOLIC PANEL
BUN: 33 mg/dL — ABNORMAL HIGH (ref 6–23)
CO2: 29 mEq/L (ref 19–32)
Chloride: 97 mEq/L (ref 96–112)
Chloride: 99 mEq/L (ref 96–112)
Creatinine, Ser: 1.96 mg/dL — ABNORMAL HIGH (ref 0.4–1.5)
GFR calc Af Amer: 40 mL/min — ABNORMAL LOW (ref >60)
GFR calc Af Amer: 43 mL/min — ABNORMAL LOW (ref >60)
GFR calc non Af Amer: 33 mL/min — ABNORMAL LOW (ref >60)
Potassium: 3.7 mEq/L (ref 3.5–5.1)
Potassium: 3.9 mEq/L (ref 3.5–5.1)

## 2010-11-04 LAB — FERRITIN: Ferritin: 9 ng/mL — ABNORMAL LOW (ref 22–322)

## 2010-11-04 LAB — PROTIME-INR
INR: 2.7 — ABNORMAL HIGH (ref 0.00–1.49)
Prothrombin Time: 31 seconds — ABNORMAL HIGH (ref 11.6–15.2)

## 2010-11-05 ENCOUNTER — Encounter (HOSPITAL_COMMUNITY): Payer: Medicare Other

## 2010-11-07 ENCOUNTER — Encounter (HOSPITAL_COMMUNITY): Payer: Medicare Other

## 2010-11-12 ENCOUNTER — Encounter (HOSPITAL_COMMUNITY): Payer: Medicare Other

## 2010-11-12 ENCOUNTER — Encounter: Payer: Self-pay | Admitting: Internal Medicine

## 2010-11-14 ENCOUNTER — Encounter (HOSPITAL_COMMUNITY): Payer: Medicare Other

## 2010-11-14 ENCOUNTER — Encounter: Payer: Self-pay | Admitting: Internal Medicine

## 2010-11-14 ENCOUNTER — Ambulatory Visit (INDEPENDENT_AMBULATORY_CARE_PROVIDER_SITE_OTHER): Payer: Medicare Other | Admitting: Internal Medicine

## 2010-11-14 DIAGNOSIS — E678 Other specified hyperalimentation: Secondary | ICD-10-CM

## 2010-11-14 DIAGNOSIS — J449 Chronic obstructive pulmonary disease, unspecified: Secondary | ICD-10-CM

## 2010-11-14 DIAGNOSIS — G4730 Sleep apnea, unspecified: Secondary | ICD-10-CM

## 2010-11-14 NOTE — Assessment & Plan Note (Signed)
Weight los is not easy for him, but we will continue to encourage exercise and diet prudence.

## 2010-11-14 NOTE — Patient Instructions (Signed)
I am pleased that Pulmonary Rehab helped. Talk with Jamesetta So about maintenance. An alternative could be the [employer] program at the [facility].   Please call if needed

## 2010-11-14 NOTE — Assessment & Plan Note (Signed)
We will continue present settings

## 2010-11-14 NOTE — Progress Notes (Signed)
  Subjective:    Patient ID: [name] Gill, male    DOB: 09-May-1926, 77 y.o.   MRN: [id number]  HPI 73 yoM followed for OSA and COPD complicated by CAD and AFib/ pacemaker.  with obesity hypoventilation syndrome. Last here May 15, 2010. Since then he finished Pulmonary Rehab and says it was life changing. Pacing himself he can now walk around the block with a few rest stops. We talked about ways to maintain the momentum. Otherwise quiet winter- had one respiratory infection- not bad. Heart ok- no new problems or changes by Dr Mayford Knife. FEV1 was 1.35/ 58% in 2011. Dr Pete Glatter did CXR during the winter.  He continues BIPAP 11/9 with O2 2 L/M, used at least 5-6 hours every night.  Denies routine cough, phlegm, chest pain, palpitation, bloody or purulent sputum.    Review of Systems See HPI Constitutional:   No weight loss, night sweats,  Fevers, chills, fatigue, lassitude. HEENT:   No headaches,  Difficulty swallowing,  Tooth/dental problems,  Sore throat,                No sneezing, itching, ear ache, nasal congestion, post nasal drip,   CV:  No chest pain,  Orthopnea, PND, swelling in lower extremities, anasarca, dizziness, palpitations  GI  No heartburn, indigestion, abdominal pain, nausea, vomiting, diarrhea, change in bowel habits, loss of appetite  Resp: No excess mucus, no productive cough,  No non-productive cough,  No coughing up of blood.  No change in color of mucus.  No wheezing.    Skin: no rash or lesions.  GU: no dysuria, change in color of urine, no urgency or frequency.  No flank pain.  MS:  No joint pain or swelling.  No decreased range of motion.  No back pain.  Psych:  No change in mood or affect. No depression or anxiety.  No memory loss.      Objective:   Physical Exam General- Alert, Oriented, Affect-appropriate, Distress- none acute  Obese with prominent abdomen  Skin- rash-none, lesions- none, excoriation- none  Lymphadenopathy- none  Head-  atraumatic  Eyes- Gross vision intact, PERRLA, conjunctivae clearsecretions  Ears-  Hearing--aids,   Nose- Clear,  No- Septal dev, mucus, polyps, erosion, perforation   Throat- Mallampati II , mucosa clear , drainage- none, tonsils- atrophic. Upper denture, few remaining lower teeth  Neck- flexible , trachea midline, no stridor , thyroid nl, carotid no bruit  Chest -unlabored     Heart/CV- RRR , no murmur , no gallop  , no rub, nl s1 s2                     - JVD- 1+ , edema- tace at ankles, stasis changes- none, varices- none     Lung- clear to P&A, wheeze- none, cough- none , dullness-none, rub- none.  Shallow c/w obesity     Chest wall- pacemaker left  Abd- tender-no, distended-no, bowel sounds-present, HSM- no  Br/ Gen/ Rectal- Not done, not indicated  Extrem- cyanosis- none, clubbing, none, atrophy- none, strength- nl  Neuro- grossly intact to observation         Assessment & Plan:

## 2010-11-14 NOTE — Assessment & Plan Note (Signed)
Controlled. Improved after Pulmonary Rehab- He is going to talk with them about maintenance.

## 2010-11-19 ENCOUNTER — Encounter (HOSPITAL_COMMUNITY): Payer: Self-pay

## 2010-11-21 ENCOUNTER — Encounter (HOSPITAL_COMMUNITY): Payer: Self-pay

## 2010-11-26 ENCOUNTER — Encounter (HOSPITAL_COMMUNITY): Payer: Self-pay

## 2010-11-28 ENCOUNTER — Encounter (HOSPITAL_COMMUNITY): Payer: Self-pay

## 2010-12-03 ENCOUNTER — Encounter (HOSPITAL_COMMUNITY): Payer: Self-pay

## 2010-12-03 NOTE — Op Note (Signed)
NAMEMarland Gill  [name], [name] NO.:  [id number]   MEDICAL RECORD NO.:  [id number]          PATIENT TYPE:  INP   LOCATION:  5524                         FACILITY:  [facility]   PHYSICIAN:  Petra Kuba, M.D.    DATE OF BIRTH:  1925/10/29   DATE OF PROCEDURE:  07/26/2008  DATE OF DISCHARGE:                               OPERATIVE REPORT   PROCEDURE:  Esophagogastroduodenoscopy.   INDICATION:  Iron-deficiency anemia.  Consent was signed after risks,  benefits, methods, options thoroughly discussed yesterday with my PA and  prior to sedation today.   MEDICINES USED:  Fentanyl 50 mcg and Versed 4 mg.   PROCEDURE:  The video endoscope was inserted by direct vision.  The  esophagus was normal.  It did have a tiny to small hiatal hernia.  Scope  passed into the stomach.  No blood was seen.  There was a little  proximal fluid that needed washing and suctioning but no signs of  bleeding were seen.  The scope was advanced through a normal antrum,  normal pylorus into a normal duodenal bulb around the C-loop to a normal  second portion of the duodenum.  No blood was seen distally.  Scope was  withdrawn back to the bulb which again confirmed its normal appearance.  Scope was withdrawn back to the stomach and retroflexed.  Cardia,  fundus, angularis, lesser and greater curve were evaluated on retroflex  and straight visualization and other than some mild atrophic gastritis  no other obvious abnormalities were seen.  The patient did not tolerate  the procedure well, seemed to fight with [place] and we elected to stop the  procedure at this juncture instead of double check the duodenum.  Air  was suctioned.  Scope slowly withdrawn.  Again a good look at the  esophagus was normal.  Scope was removed.  The patient tolerated the  procedure fair.  There was no obvious immediate complication.   ENDOSCOPIC DIAGNOSES:  1. Tiny to small hiatal hernia.  2. No blood seen.  3. Minimal atrophic  gastritis.  4. Otherwise, normal EGD.   PLAN:  We need to find his old colonoscopy report for proceeding.  Re-  evaluate his Coumadin, plus or minus aspirin needs, not sure if we can  proceed with a capsule with his pacemaker but consider rechecking colon  or CAT scan to rule out anything __________.  We will follow clinically  on iron and repeat guaiacs just to see if further workup plans are  needed.           ______________________________  Petra Kuba, M.D.     MEM/MEDQ  D:  07/26/2008  T:  07/26/2008  Job:  [id number]   cc:   Hal T. Stoneking, M.D.

## 2010-12-03 NOTE — Op Note (Signed)
NAME:  [name], [name] NO.:  [id number]   MEDICAL RECORD NO.:  [id number]          PATIENT TYPE:  AMB   LOCATION:  DSC                          FACILITY:  [facility]   PHYSICIAN:  Kristine Garbe. Ezzard Standing, M.D.DATE OF BIRTH:  [birth year]   DATE OF PROCEDURE:  12/05/2008  DATE OF DISCHARGE:                               OPERATIVE REPORT   PREOPERATIVE DIAGNOSIS:  Basal cell carcinoma of the right cheek.   POSTOPERATIVE DIAGNOSIS:  Basal cell carcinoma of the right cheek.   OPERATION:  Excision of right cheek basal cell carcinoma (2 cm).   SURGEON:  Kristine Garbe. Ezzard Standing, MD   ANESTHESIA:  Local Xylocaine with epinephrine.   COMPLICATIONS:  None.   BRIEF CLINICAL NOTE:  [name] Gill is an 77 year old gentleman who had  had a previous right cheek basal cell carcinoma removed about 4-5 years  ago.  He now redeveloped a smaller nodule just posterior to where the  previous excision was performed.  On examination, the patient has a  approximately 2 cm firm nodule in the subcutaneous tissue just posterior  to the previous scar.  There is really no skin changes and I suspect  this represents a persistent or recurrent subcutaneous basal cell  carcinoma as the nodule is fairly firm.  He is taken to the operating  room at this time for excision of right cheek nodule consistent with  basal cell carcinoma.   DESCRIPTION OF PROCEDURE:  Right cheek area was prepped with Betadine  solution.  The nodule was marked out and then injected with 4 mL of  Xylocaine with epinephrine for local anesthetic.  Some of the overlying  skin and then the firm subcutaneous nodule was removed and sent to  pathology.  Hemostasis was obtained with the cautery and the small  defect was closed with 4-0 chromic sutures subcutaneously and a 6-0  nylon reapproximated the skin edges.  Bacitracin ointment was applied.  The patient tolerated the procedure well and is discharged home later  this morning.   We will have the patient follow up in my office next Monday for recheck.  Review pathology and have sutures removed.           ______________________________  Kristine Garbe Ezzard Standing, M.D.     CEN/MEDQ  D:  12/05/2008  T:  12/05/2008  Job:  [id number]   cc:   [name] Gill, M.D.

## 2010-12-03 NOTE — Discharge Summary (Signed)
NAMEYOAN, SALLADE NO.:  [id number]   MEDICAL RECORD NO.:  [id number]          PATIENT TYPE:  INP   LOCATION:  5524                         FACILITY:  [facility]   PHYSICIAN:  Hollice Espy, M.D.DATE OF BIRTH:  1925-11-11   DATE OF ADMISSION:  07/25/2008  DATE OF DISCHARGE:  07/27/2008                               DISCHARGE SUMMARY   PRIMARY CARE PHYSICIAN:  Dr. Ann Maki T. Stoneking.   CONSULTATIONS:  Dr. Petra Kuba, [facility].   CARDIOLOGIST:  Dr. Armanda Magic.   DISCHARGE DIAGNOSES:  1. Iron deficiency anemia.  2. Transfusion of 2 units of packed red blood cells.  3. Status post endoscopy, found to be unremarkable.  4. History of coronary artery disease, status post coronary artery      bypass graft surgery, status post pacemaker.  5. The patient has a reported history of diastolic dysfunction and      congestive heart failure, secondary to a cardiac catheterization      done in June 2009, with some preserved ejection fraction.  6. He has a history of O2 dependent chronic obstructive pulmonary      disease.  7. Hypertension.  8. Dyslipidemia.  9. Peripheral vascular disease.  10.Sleep apnea.   DISCHARGE MEDICATIONS:  1. The patient will resume all of his previous medications, with the      exception of aspirin.  This medication is being held for the next      two to three weeks, then he will resume this medicine.  2. New medication:  Iron 325 mg p.o. twice daily.  3. Protonix 40 mg p.o. daily.  4. MiraLax 17 grams p.o. daily over-the-counter.   PREVIOUS MEDICATIONS TO BE CONTINUED:  1. K-Dur 10 mEq p.o. daily.  2. Albuterol p.r.n.  3. Lasix 80 mg in the morning, 40 mg in the evening.  4. Lipitor 10 mg.  5. Sotalol 180 mg twice daily.  6. Cardizem 180 mg daily.  7. Tri-Chlor 145 mg.  8. Spiriva inhaled daily.  9. Symbicort two puffs twice daily.  10.Coumadin.   DISCHARGE DIET:  A heart-healthy diet.   DISCHARGE ACTIVITIES:   Slow to increase.   CONDITION ON DISCHARGE/DISPOSITION:  Improved.  The patient is being  discharged to home.   HISTORY:  The patient is an 77 year old white male with a past medical  history of extensive coronary artery disease, as listed above, who was  sent over from his cardiology, Dr. Gloris Manchester Turner's office, after he had  been complaining of increased shortness of breath.  When she checked his  labs, she found his hemoglobin to be 5.7.  The patient had no reports of  black tarry stools or bloody stools.  He was not hypotensive or  tachycardic; however, with that significant drop in hemoglobin, and the  fact that the patient is already on Coumadin, secondary to atrial  fibrillation, she was concerned about the possibility of a bleed.   HOSPITAL COURSE:  When the patient arrived at the emergency room he was  seen directly.  An MCV noted on his CBC was noted to  be around 75,  bringing up the possibility that  perhaps this was not a GI bleed but  rather an anemia.  [facility] was consulted, who saw the  patient.  The patient's INR remained therapeutic.  He underwent an EGD  on 07/26/2008.  His CBC was completely unremarkable, showing no signs of  any gastric ulcerations.  The patient's hemoglobin, status post  transfusion, improved to 8.4 and then by 07/27/2008, was 8.6.  At this  point he is medically stable for discharge.  In discussion with his GI,  it was felt that the patient could follow up with [facility] in two weeks'  time.  They recommended that he have a daily PPI on his regimen.  Hold  NSAID's for now, including his aspirin and otherwise could continue the  rest of his medications.  It was felt that it was iron deficiency anemia  and iron supplementation be given at this time.   PLAN:  1. To follow up with Dr. Ewing Schlein on 08/16/2008.  2. In the interim, he will follow up with his PCP, Dr. Pete Glatter in      one week's time.  At that time Dr. Pete Glatter can recheck  a      hemoglobin as well as follow up the patient's Coumadin levels, to      ensure that his INR is therapeutic.   The patient is being discharged to home.      Hollice Espy, M.D.  Electronically Signed     SKK/MEDQ  D:  07/27/2008  T:  07/27/2008  Job:  [id number]   cc:   Hal T. Stoneking, M.D.  Armanda Magic, M.D.  Petra Kuba, M.D.

## 2010-12-03 NOTE — Procedures (Signed)
NAME:  [name] Gill, [name] Gill NO.:  [id number]   MEDICAL RECORD NO.:  [id number]          PATIENT TYPE:  OUT   LOCATION:  SLEEP CENTER                 [facility]ACILITY:  [facility]   PHYSICIAN:  Clinton D. Maple Hudson, MD, FCCP, FACPDATE OF BIRTH:  November 05[birth year] 1927   DATE OF STUDY:  04/04/2008                            NOCTURNAL POLYSOMNOGRAM   REFERRING PHYSICIAN:   INDICATION FOR STUDY:  Hypersomnia with sleep apnea.   EPWORTH SLEEPINESS SCORE:  13/24, BMI 34.7, weight 225 pounds, height 67  inches, neck 18 inches.   HOME MEDICATION:  Charted and reviewed.   SLEEP ARCHITECTURE:  Split study protocol.  During the diagnostic phase,  total sleep time was 141 minutes with sleep efficiency 69.3%  Stage 1  was 4.3%, stage 2 was 74.8%, stage 3 absent, REM 20.9% of total sleep  time.  Sleep latency 7.5 minutes, REM latency 156 minutes, awake after  sleep onset 48 minutes, arousal index 15.7.  No bedtime medication was  taken.   RESPIRATORY DATA:  Split study protocol.  Apnea/hypopnea index (AHI)  14.5 per hour before CPAP.  A total of 34 events was scored including 25  obstructive apneas and 9 hypopneas.  Most events were nonsupine.  REM  AHI 54.9.  CPAP was titrated to 12 CWP, AHI zero per hour.  He chose a  medium ResMed Mirage Quattro mask with heated humidifier.   OXYGEN DATA:  Moderate snoring.  The patient wore oxygen at 3 L per  minute through the study as per home prescription.  Before CPAP, he  desaturated to a nadir of 86%.  After CPAP control, mean oxygen  saturation on 3 L prongs plus CPAP was 94.2%.   CARDIAC DATA:  Paced rhythm with occasional ventricular ectopic.   MOVEMENT/PARASOMNIA:  No significant movement disturbance.  Bathroom x3.   IMPRESSION/RECOMMENDATION:  1. Mild obstructive sleep apnea/hypopnea syndrome, apnea/hypopnea      index 14.5 per hour with events more common while supine.  Moderate      snoring with oxygen desaturation to a nadir of 86% while wearing   supplemental oxygen at 3 L per minute throughout this study.  2. Successful continuous positive airway pressure (CPAP) titration to      12 cm of water, apnea/hypopnea index zero per hour.  He chose a      medium ResMed Mirage Quattro full face mask with heated humidifier.      Clinton D. Maple Hudson, MD, [facility], FACP  Diplomate, Biomedical engineer of Sleep Medicine  Electronically Signed     CDY/MEDQ  D:  04/08/2008 10:22:58  T:  04/08/2008 14:33:56  Job:  [id number]

## 2010-12-03 NOTE — H&P (Signed)
NAME:  [name] Gill NO.:  [id number]   MEDICAL RECORD NO.:  [id number]          PATIENT TYPE:  INP   LOCATION:  3742                         FACILITY:  [facility]   PHYSICIAN:  Vesta Mixer, M.D. DATE OF BIRTH:  [birth year]   DATE OF ADMISSION:  03/11/2008  DATE OF DISCHARGE:                              HISTORY & PHYSICAL   [name] Gill is an 77 year old gentleman with a recent pacemaker  placement.  He is admitted today with a high fever.   [name] Gill is an 77 year old gentleman with a history of coronary  artery disease.  He moved here from [place] several months ago.   The patient has a history of CAD.  He is status post coronary artery  bypass grafting in 2004.  He has a history of atrial fibrillation and  sick sinus syndrome.  He had a pacemaker placed 2 days ago.  He has a  history of COPD and is O2 dependent.  He has a history of hypertension  and peripheral vascular disease, sleep apnea, and dyslipidemia.   The patient was admitted to the hospital on March 08, 2008, for sick  sinus syndrome.  His Coumadin was held and he had a pacemaker placed.  I  do not have records of that procedure.   The patient apparently tolerated it fairly well.  Yesterday, he had a  little bit of fever and this morning, he developed a fever of 101.5.  He  called me and we had him come to the ER for further evaluation.   Here in the emergency room, his temperature increased to 102.5.  He  developed a cough productive of fairly clear white sputum.  He will be  admitted for further evaluation.   The patient denies any dysuria or hematuria.  He denies any drainage  from his pacer pocket.  He denies any other problems.  His wife did  state that she saw a large swelling on the upper part of his right  thigh.  She was able to massage this area and the swelling went away.  It was fairly tender.  He has not had any worsening of his shortness of  breath.   CURRENT  MEDICATIONS:  1. Lipitor 10 mg a day.  2. TriCor 145 mg a day.  3. Lasix 80 mg a day.  4. Potassium chloride 10 mEq a day.  5. Metolazone 2.5 mg as needed.  6. BiPAP each night.  7. Aspirin 81 mg a day.  8. Symbicort once a day.  9. Coumadin 2.5 mg 5 days weekly and 5 mg 2 days a week.  10.Cardura 2 mg at bedtime.  11.Albuterol as needed.   ALLERGIES:  None.   PAST MEDICAL HISTORY:  1. Coronary artery disease - status post coronary artery bypass      grafting.  In 2004, he had a left internal mammary artery to the      LAD.  He had a radial artery graft (T-graft) to the obtuse marginal      artery #1, obtuse marginal artery #2, posterolateral branch and the  posterior descending artery.  2. Hypertension.  3. Hyperlipidemia.  4. COPD.  5. Sleep apnea.  6. Sick sinus syndrome/atrial fibrillation.  7. Peripheral vascular disease with known carotid artery stenosis.   SOCIAL HISTORY:  The patient used to smoke but quit several years ago.  He is a retired Clinical research associate.  He is married and has 4 children.  He drinks  beer occasionally.   His family history reveals no significant cardiac disease.   GENERAL:  On exam, he is an elderly gentleman in mild distress.  VITAL SIGNS:  His temperature is 102.5, heart rate 65, and blood  pressure is 110/70.  HEENT:  Carotids 2+.  LUNGS:  Bilateral wheezing.  I do not hear any rales.  HEART:  Regular rate.  S1 and S2.  ABDOMEN:  Good bowel sounds and is nontender.  EXTREMITIES:  He has no clubbing, cyanosis, or edema.   His pacer site appears to be normal.  There is no swelling and no  erythema.  There is no drainage.  He is quite warm all over, but the  pacer site does not appear to be any more warmth than the rest of them.   His urinalysis is unremarkable.  His white blood cell count is 7.3.  His  hemoglobin is 12.3.  His INR is 1.5.  His creatinine is up slightly to  2.1.   His chest x-ray reveals clear lungs.  He has a pacemaker  inserted in the  left subclavian area.  He has an atrial lead and a right ventricular  lead.  They appeared to be adequately placed.   IMPRESSION AND PLAN:  Fever, the patient presents with a fever 2 days  following an operation for pacemaker implantation.  It is quite possible  that he has a pacemaker infection.  I agree with his choice of  antibiotics.  We will need to look for other causes of fever.  On lung  exam, he has some wheezes, but on chest x-ray, I do not really see  any evidence of pneumonia.  His white blood cell count is normal which  is elevated, but perhaps it is too early for it to be elevated.  We will  admit him and have the medical doctors follow along with [place] in case  there are any other issues.      Vesta Mixer, M.D.  Electronically Signed     Vesta Mixer, M.D.  Electronically Signed    PJN/MEDQ  D:  03/11/2008  T:  03/12/2008  Job:  [id number]   cc:   Armanda Magic, M.D.  Francisca December, M.D.

## 2010-12-03 NOTE — Discharge Summary (Signed)
[name], [name] NO.:  [id number]   MEDICAL RECORD NO.:  [id number]          PATIENT TYPE:  INP   LOCATION:  3742                         FACILITY:  [facility]   PHYSICIAN:  Corky Crafts, MDDATE OF BIRTH:  Feb 25, 1926   DATE OF ADMISSION:  03/11/2008  DATE OF DISCHARGE:  03/15/2008                               DISCHARGE SUMMARY   DISCHARGE DIAGNOSES:  1. Fever resolved.  2. Sick sinus syndrome status post recent permanent pacemaker.  3. Peripheral vascular disease with known carotid artery stenosis.  4. Coronary artery disease status post bypass grafting:  Left internal      mammary artery to the left anterior descending, radial graft to the      obtuse marginal artery #1, obtuse marginal artery #2,      posterolateral branch, and posterior descending artery.  5. Hypertension.  6. Hyperlipidemia.  7. Chronic obstructive pulmonary disease.  8. Sleep apnea.  9. Atrial fibrillation.   HOSPITAL COURSE:  [name] Gill was admitted to the hospital on March 11, 2008, after a recent pacemaker implantation.  He was admitted with a  high fever.  His chest x-ray on admission showed mild central pulmonary  vascular congestion.  Ultimately, his blood cultures were negative.  He  had a C. difficile assay and this was negative.  We had Infectious  Disease physician, Dr. Cliffton Asters involved in the patient's care and  there was no obvious cause for his transient fevers.  His pacemaker site  showed no sign of infection.  On March 15, 2008, the patient was  discharged to home.   LABORATORY STUDIES:  During his stay are as above and include white  count 4.6, hemoglobin 11.9, hematocrit 35.6, and platelets 298.  Sodium  142, potassium 4.0, BUN 19, and creatinine 1.49.   DISCHARGE MEDICATIONS:  1. Lipitor 10 mg a day.  2. TriCor 145 mg a day.  3. Lasix 80 mg daily.  4. Potassium 10 mEq daily.  5. Baby aspirin daily.  6. Symbicort 1 puff twice a day.  7.  Coumadin 2.5 mg for 5 days, then 5 mg for 2 days.  8. Cardura 2 mg daily.  9. Albuterol 2 puffs p.r.n.  10.Spiriva 1 puff daily.   DISCHARGE INSTRUCTIONS:  Wound care and activity as prior to admission,  he is to remain on a low-sodium, heart-healthy diet.  Follow up in the  [facility] on March 21, 2008, at 11 a.m.  Follow up with Dr.  Mayford Knife on March 28, 2008, at 12:45 p.m.      [name] Gill, P.A.      Corky Crafts, MD  Electronically Signed    LB/MEDQ  D:  04/15/2008  T:  04/16/2008  Job:  [id number]

## 2010-12-03 NOTE — H&P (Signed)
[name], [name] Gill NO.:  [id number]   MEDICAL RECORD NO.:  [id number]          PATIENT TYPE:  INP   LOCATION:  1852                         FACILITY:  [facility]   PHYSICIAN:  Hollice Espy, M.D.DATE OF BIRTH:  [birth year]   DATE OF ADMISSION:  07/25/2008  DATE OF DISCHARGE:                              HISTORY & PHYSICAL   PRIMARY CARE PHYSICIAN:  Dr. Merlene Laughter   CONSULTANTS:  Dr. Petra Kuba, [facility]   CARDIOLOGIST:  Dr. Armanda Magic.   CHIEF COMPLAINT:  Anemia.   HISTORY OF PRESENT ILLNESS:  The patient is an 77 year old white male  with past medical history of CHF, CAD, paroxysmal atrial fibrillation on  chronic Coumadin, and iron deficiency anemia who was seen in the office  by his cardiologist, Dr. Armanda Magic.  She ordered some routine blood  work, and the patient was found to have a hemoglobin markedly low at  5.7.  He was also noted to have an MCV of 75.  The rest of his blood  work was not able to be obtained including a BMET or PT/INR, but given  his severe anemia, it was unclear if this was an actual true GI bleed or  if this was more secondary to chronic iron deficiency anemia that had  reached a critical threshold.  The patient was then sent over to the  emergency room as a direct admission.  When I saw the patient, he was  doing well.  He denied any headaches, vision changes or dysphagia, no  chest pain, no shortness of breath at rest, although he says, overall,  he has had significant dyspnea on exertion over the past few weeks even  with minor activities which is new for him.  He has had no abdominal  pain, no hematuria, dysuria, constipation, diarrhea, no black tarry  stools, no bright red blood per rectum.   REVIEW OF SYSTEMS:  Otherwise negative.   PAST MEDICAL HISTORY:  1. History of CAD.  2. Atrial fibrillation.  3. Fatigue.  4. Weakness.  5. Iron deficiency anemia.  6. Peripheral vascular disease.  7.  History of left external carotid artery stenosis.  8. Obesity.  9. Oxygen-dependent COPD.  10.Tachybrady syndrome, status post pacemaker.  11.History of CABG.   MEDICATIONS:  He is currently on the following medications:  1. K-Dur 10.  2. Albuterol p.r.n.  3. Aspirin 81.  4. Coumadin.  5. Lasix 80 in the morning, 40 in the evening.  6. Lipitor 10.  7. Sotalol 80 b.i.d.  8. Cardizem 180 daily.  9. Tricor 145.  10.Spiriva inhaled daily.  11.Symbicort 2 puffs b.i.d.   ALLERGIES:  He has NO KNOWN DRUG ALLERGIES.   SOCIAL HISTORY:  No tobacco, heavy alcohol or drug use.   FAMILY HISTORY:  Noncontributory.   PHYSICAL EXAMINATION:  VITAL SIGNS:  In the office, his blood pressure  was noted to 108/50 with a heart rate of 68.  Please note that he does  have a pacer.  Here in the ER, his temperature is 97.9, heart rate 60,  blood pressure 100/55,  respiratory rate 24, and O2 saturation 95% on  room air.  GENERAL:  He is alert and oriented x3, in no apparent distress.  HEENT: Normocephalic atraumatic.  His mucous membranes are slightly dry.  NECK:  He has no carotid bruits.  HEART:  Irregular rate and rhythm, S1, S2, 2/6 systolic ejection murmur.  LUNGS:  Decreased breath sounds throughout but secondary to his body  habitus.  ABDOMEN:  Soft, obese, nontender, positive bowel sounds.  EXTREMITIES:  Show no clubbing, cyanosis, about a trace of 1+ pitting  edema.   LABORATORY WORK:  I have ordered a CMET, coags, type and cross and iron  studies, all of which are pending.   ASSESSMENT AND PLAN:  1. Anemia, microcytic, question if this is a gastrointestinal bleed      versus iron deficiency anemia versus other.  We are going to hold      his Coumadin for now as well as his aspirin.  Transfuse 2 units      packed red blood cells slowly with 40 of Lasix in between.  I have      asked [facility] to come see the patient.  The patient      has had a previous colonoscopy done in  the past year, but this was      back when he lived in [place] and had it done there.  2. History of coronary artery disease, paroxysmal atrial fibrillation,      congestive heart failure, again holding Coumadin.  Continue to      follow.  Will hold his other medications.  3. Chronic obstructive pulmonary disease.  Will continue O2 at 2 L and      Spiriva.      Hollice Espy, M.D.  Electronically Signed     SKK/MEDQ  D:  07/25/2008  T:  07/25/2008  Job:  [id number]   cc:   Armanda Magic, M.D.  Hal T. Stoneking, M.D.  Petra Kuba, M.D.

## 2010-12-03 NOTE — Consult Note (Signed)
NAME:  [name] Gill, [name] Gill NO.:  [id number]   MEDICAL RECORD NO.:  [id number]          PATIENT TYPE:  INP   LOCATION:  5524                         FACILITY:  [facility]   PHYSICIAN:  Petra Kuba, M.D.    DATE OF BIRTH:  [birth year]   DATE OF CONSULTATION:  DATE OF DISCHARGE:                                 CONSULTATION   We were asked to see [name] Gill today in consultation for a hemoglobin  of 6.8 by Dr. Virginia Rochester of the [facility].   HISTORY OF PRESENT ILLNESS:  This is an 77 year old male with a recent  history of pacemaker placement in August 2009.  He also has a history of  partial colon resection some 40 years ago for diverticulitis.  He was  admitted to the hospital today with a hemoglobin of 6.8.  He reports his  drop in hemoglobin is surprising to him.  He has not seen any dark  stools, rather his stools are brownish-tannish color and once a day.  He  denies abdominal pain, vomiting, frank blood in his stool.  He does not  take any NSAIDs.  He does report that he has occasional indigestion and  sometimes will have soda get caught in his throat and has some  difficulty swallowing, but does not report any difficulty swallowing  food, such as bread or meat.  He also tells me that sometimes he has  swings in his INR, one such time was last week when his INR went up to  3.7, today it is 2.7.  He does have a history of GI bleeding some 5  years ago and he was changed from taking a 325 mg aspirin daily to  taking an 81 mg aspirin daily; however, he was not placed on iron and he  has not received a blood transfusion and he has no PPI coverage.  His  hemoglobin in August 2009 was 11.6, that appears to be his baseline.  He  tells me that he had a colonoscopy at [facility] in [place], [place], by Dr. Tonye Becket 1 year ago, during which he had 2 polyps removed.  Additionally in 2006, he had a colonoscopy and also had 2 polyps  removed.  He  reports that Dr. Laverle Hobby office has a copy of these op  notes and I have requested them.   Past medical history is significant for coronary artery disease, atrial  fibrillation, fatigue and weakness, iron deficiency anemia, peripheral  vascular disease.  Left external carotid artery stenosis.  He is O2  dependent.  He has COPD.  He is status post coronary artery bypass  graft.  He is status post pacemaker placement in 2009 from tachy-brady  syndrome.  He has a history of a hemicolectomy some 40 years ago from  diverticulitis.  He does have a history of colon polyps.   Current medications include an 81 mg aspirin, Coumadin, and the rest is  per chart.   He has no known drug allergies.   Review of systems is negative for fever, anorexia, or palpitations.  SOCIAL HISTORY:  He did have a 60-year tobacco history, however, quit  smoking about 10 years ago.  He tells me that he drinks 2 glasses of  wine a month on average.   Family history is negative for ulcer disease and colon cancer.   On physical exam, he is alert and oriented, pleasant to speak with.  His  temperature is 98.3, pulse 65, respirations are 20, blood pressure is  117/63.  Cardiovascular system has a regular rate with a systolic  murmur.  Lungs are clear to auscultation anteriorly.  Abdomen is obese,  soft, nontender, nondistended with good bowel sounds.  He does have a  large set of perpendicular scars, one he tells me was a hernia repair  and one was his hemicolectomy.   Current labs show a hemoglobin of 6.8 with an MCV value of 75.7,  hematocrit 22.5, white count 9.3 platelets 361,000.  BUN is 20,  creatinine 2.04.  LFTs are within normal limits.  PT is 31, PTT is 43,  INR is 2.7.   ASSESSMENT:  Dr. Vida Rigger has seen and examined the patient, collected  a history and reviewed his chart.  His impression is this is an 82-year-  old gentleman with anemia, probably a GI bleed.  We will plan to give IV  Protonix  and begin our evaluation with an upper endoscopy tomorrow  morning at 9:30 a.m.  We will also obtain the colon report from Dr.  Laverle Hobby office.   Thank you very much for this consultation.      Stephani Police, PA    ______________________________  Petra Kuba, M.D.    MLY/MEDQ  D:  07/25/2008  T:  07/26/2008  Job:  [id number]   cc:   Armanda Magic, M.D.  Hal T. Stoneking, M.D.  Petra Kuba, M.D.

## 2010-12-03 NOTE — H&P (Signed)
NAMEMarland Gill  [name], Gill NO.:  [id number]   MEDICAL RECORD NO.:  [id number]          PATIENT TYPE:  INP   LOCATION:  2007                         FACILITY:  [facility]   PHYSICIAN:  Lyn Records, M.D.   DATE OF BIRTH:  [birth year]   DATE OF ADMISSION:  03/08/2008  DATE OF DISCHARGE:                              HISTORY & PHYSICAL   REASON:  Tachybrady syndrome.   HISTORY OF PRESENT ILLNESS:  [name] Gill is a pleasant 77 year old  male patient with known history of coronary artery disease, recent  atrial fibrillation and hypertension.  He has recently moved to the  [place] area from [place].  Approximately 2 months ago, the patient  was admitted to the hospital for increased dyspnea.  He was found to be  in atrial fibrillation with rapid ventricular rate.  He was placed on  sotalol and anticoagulated.  He eventually converted to sinus rhythm.  The patient does have a history of coronary artery disease and is status  post CABG back in 2004.  He had a cardiac catheterization at the time of  his atrial fib onset that revealed no evidence of graft occlusion.  An  echo at that time revealed moderate left atrial enlargement, significant  LVH with normal EF.  He also had some mild mitral regurgitation.  He was  initially seen by Dr. Armanda Magic at the first of August, about the  time he moved to [place].  He was found to be in sinus bradycardia  with heart rate at 45 beats per minute.  His sotalol was decreased at  that time.  At a subsequent visit, his heart rate had decreased to 42  beats per minute.  His sotalol was stopped at that time and the patient  was placed on CardioNet monitor.  This has demonstrated in the last 24  hours multiple episodes of atrial fib with rates as high as 155.  He has  been asymptomatic with this without increase in shortness of breath,  palpitations, tachy arrhythmias or syncope.  He is being admitted at  this time for tachybrady  syndrome for heart rate management and  consideration of permanent pacemaker insertion.   REVIEW OF SYSTEMS:  GENERAL:  Has felt well.  No recent weight loss or  gain.  No fever or chills.  HEENT:  Wears hearing aids, wears glasses.  No problems with nasal obstruction, epistaxis, sore throat.  NECK:  No  stiffness.  CHEST:  As above.  Denies PND, orthopnea.  Denies cough or  wheezing.  He has COPD and is on chronic O2 therapy.  CARDIOVASCULAR:  Denies pain, pressure, tachy palpitations.  ABDOMEN:  Some nausea over  the last 24 hours.  No vomiting.  No problems with stools, including no  melena or hematochezia.  GENITOURINARY:  No dysuria, urinary frequency,  hematuria.  EXTREMITIES:  Denies any leg claudication symptoms.  No edema.  No  unusual joint stiffness.  NEUROLOGIC:  Denies numbness, tingling,  headache, seizures, memory loss, tremors, vertigo, lightheadedness or  syncope.   PAST MEDICAL HISTORY:  1. Atrial fibrillation with  rapid ventricular rate.  2. Sinus bradycardia, marked, on sotalol therapy.  3. Chronic Coumadin use.  4. Coronary artery disease, status post CABG, 2004, with recent cath      February 04, 2008, showing patent grafts and preserved LV function.  EF      65%.  5. COPD, O2 dependent, followed by Dr. Fannie Knee.  6. Hypertension.  7. Peripheral vascular disease with moderate stenosis in the left      carotid artery.  8. Sleep apnea, on BiPAP.  9. Dyslipidemia.   PAST SURGICAL HISTORY:  1. CABG x5, March 2004.      a.     LIMA to the LAD.      b.     Radial artery as a T graft to the OM1, OM2, PL circ and       posterior descending artery.  2. Hemicolectomy secondary to diverticulitis, 1972.  3. Multiple hernia surgeries.   CURRENT MEDICATIONS:  1. Lipitor 10 mg a day.  2. Tricor 145 mg daily.  3. Lasix 80 mg daily.  4. Potassium chloride 10 mEq daily.  5. Aspirin 81 mg daily.  6. Symbicort 160/4.5 one puff b.i.d.  7. Spiriva 1 puff p.o. daily.  8.  Coumadin 2.5 mg p.o. daily except 5 mg on Mondays and Thursdays.  9. Cardura 2 mg p.o. at h.s. daily.  10.Albuterol MDI 2 puffs p.o. q.i.d. p.r.n.  11.Metolazone 2.5 mg p.r.n.  12.O2 at 3 to 5 liters per minute via nasal cannula continuously.  13.BiPAP at h.s.   DRUG ALLERGIES:  None known.   SOCIAL HISTORY:  A retired Clinical research associate.  Married with 4 children.  Stopped  smoking 11 years ago after a multiple-pack-year history.  Occasionally  drinks 3 beers a week.   FAMILY HISTORY:  No family history of early coronary artery disease.   OBJECTIVE:  VITAL SIGNS:  Weight 232, pulse is 68, blood pressure is  100/60.  HEENT:  Ear, nose and throat unremarkable.  NECK:  Supple without lymphadenopathy.  No carotid bruits are  auscultated.  No JVD.  CHEST:  Good excursion, clear throughout without rales, wheezing or  rhonchi.  CARDIOVASCULAR:  Normal S1, S2, with a grade 2/6 murmur, unchanged.  No  rub auscultated.  ABDOMEN:  Soft and protuberant.  Bowel sounds present.  There are no  abdominal bruits appreciated.  Previous abdominal surgery scars.  No  tenderness, hepatosplenomegaly or masses.  EXTREMITIES:  No clubbing, cyanosis or edema.  There are +2 distal  pulses.  NEUROLOGIC:  Nonfocal.   EKG reveals sinus rhythm with PACs, rate at 80 beats per minute.   Review of ACT Ex monitor over the last 24 hours reveals multiple runs of  atrial fib with rapid ventricular rate lasting 60 seconds to 3 minutes  in length.   EKG today also reveals T-wave inversions in the lateral leads as well as  in leads V3 and V4 which are consistent with prior.   IMPRESSION:  1. Tachybrady syndrome.  2. Chronic Coumadin use.  3. History of coronary artery disease with recent cardiac      catheterization showing patent previous bypass grafts.  4. Normal ejection fraction.  5. Chronic obstructive pulmonary disease, oxygen dependent.  6. Sleep apnea.  7. Hyperlipidemia.  8. Hypertension.   PLAN:  We will  admit, start Cardizem drip for rate control, hold  Coumadin, and NPO after clear liquid breakfast in the morning for  permanent pacemaker insertion.  Dr. Katrinka Blazing and associates  to follow.      [name], N.P.      Lyn Records, M.D.  Electronically Signed    TCL/MEDQ  D:  03/08/2008  T:  03/08/2008  Job:  [id number]

## 2010-12-21 ENCOUNTER — Other Ambulatory Visit: Payer: Self-pay | Admitting: Internal Medicine

## 2011-01-21 ENCOUNTER — Encounter (HOSPITAL_COMMUNITY)
Admission: RE | Admit: 2011-01-21 | Discharge: 2011-01-21 | Disposition: A | Discharge status: Home / Self Care | Payer: Medicare Other | Source: Ambulatory Visit | Attending: Internal Medicine | Admitting: Internal Medicine

## 2011-01-21 DIAGNOSIS — J449 Chronic obstructive pulmonary disease, unspecified: Secondary | ICD-10-CM | POA: Insufficient documentation

## 2011-01-21 DIAGNOSIS — Z5189 Encounter for other specified aftercare: Secondary | ICD-10-CM | POA: Insufficient documentation

## 2011-01-21 DIAGNOSIS — J4489 Other specified chronic obstructive pulmonary disease: Secondary | ICD-10-CM | POA: Insufficient documentation

## 2011-01-21 DIAGNOSIS — G4730 Sleep apnea, unspecified: Secondary | ICD-10-CM | POA: Insufficient documentation

## 2011-01-21 DIAGNOSIS — R0989 Other specified symptoms and signs involving the circulatory and respiratory systems: Secondary | ICD-10-CM | POA: Insufficient documentation

## 2011-01-21 DIAGNOSIS — I4891 Unspecified atrial fibrillation: Secondary | ICD-10-CM | POA: Insufficient documentation

## 2011-01-21 DIAGNOSIS — I2510 Atherosclerotic heart disease of native coronary artery without angina pectoris: Secondary | ICD-10-CM | POA: Insufficient documentation

## 2011-01-21 DIAGNOSIS — E785 Hyperlipidemia, unspecified: Secondary | ICD-10-CM | POA: Insufficient documentation

## 2011-01-21 DIAGNOSIS — E678 Other specified hyperalimentation: Secondary | ICD-10-CM | POA: Insufficient documentation

## 2011-01-21 DIAGNOSIS — R0609 Other forms of dyspnea: Secondary | ICD-10-CM | POA: Insufficient documentation

## 2011-01-23 ENCOUNTER — Encounter (HOSPITAL_COMMUNITY): Payer: Medicare Other

## 2011-01-28 ENCOUNTER — Encounter (HOSPITAL_COMMUNITY): Payer: Medicare Other

## 2011-01-30 ENCOUNTER — Encounter (HOSPITAL_COMMUNITY): Payer: Medicare Other

## 2011-02-04 ENCOUNTER — Encounter (HOSPITAL_COMMUNITY): Payer: Medicare Other

## 2011-02-06 ENCOUNTER — Encounter (HOSPITAL_COMMUNITY): Payer: Medicare Other

## 2011-02-11 ENCOUNTER — Encounter (HOSPITAL_COMMUNITY): Payer: Medicare Other

## 2011-02-13 ENCOUNTER — Encounter (HOSPITAL_COMMUNITY): Payer: Medicare Other

## 2011-02-18 ENCOUNTER — Encounter (HOSPITAL_COMMUNITY): Payer: Medicare Other

## 2011-02-20 ENCOUNTER — Encounter (HOSPITAL_COMMUNITY): Payer: Medicare Other | Attending: Internal Medicine

## 2011-02-20 DIAGNOSIS — E678 Other specified hyperalimentation: Secondary | ICD-10-CM | POA: Insufficient documentation

## 2011-02-20 DIAGNOSIS — J449 Chronic obstructive pulmonary disease, unspecified: Secondary | ICD-10-CM | POA: Insufficient documentation

## 2011-02-20 DIAGNOSIS — J4489 Other specified chronic obstructive pulmonary disease: Secondary | ICD-10-CM | POA: Insufficient documentation

## 2011-02-20 DIAGNOSIS — I4891 Unspecified atrial fibrillation: Secondary | ICD-10-CM | POA: Insufficient documentation

## 2011-02-20 DIAGNOSIS — G4730 Sleep apnea, unspecified: Secondary | ICD-10-CM | POA: Insufficient documentation

## 2011-02-20 DIAGNOSIS — R0609 Other forms of dyspnea: Secondary | ICD-10-CM | POA: Insufficient documentation

## 2011-02-20 DIAGNOSIS — R0989 Other specified symptoms and signs involving the circulatory and respiratory systems: Secondary | ICD-10-CM | POA: Insufficient documentation

## 2011-02-20 DIAGNOSIS — E785 Hyperlipidemia, unspecified: Secondary | ICD-10-CM | POA: Insufficient documentation

## 2011-02-20 DIAGNOSIS — Z5189 Encounter for other specified aftercare: Secondary | ICD-10-CM | POA: Insufficient documentation

## 2011-02-20 DIAGNOSIS — I2510 Atherosclerotic heart disease of native coronary artery without angina pectoris: Secondary | ICD-10-CM | POA: Insufficient documentation

## 2011-02-25 ENCOUNTER — Encounter (HOSPITAL_COMMUNITY): Payer: Medicare Other

## 2011-02-27 ENCOUNTER — Encounter (HOSPITAL_COMMUNITY): Payer: Medicare Other

## 2011-03-04 ENCOUNTER — Encounter (HOSPITAL_COMMUNITY): Payer: Medicare Other

## 2011-03-06 ENCOUNTER — Encounter (HOSPITAL_COMMUNITY): Payer: Medicare Other

## 2011-03-11 ENCOUNTER — Encounter (HOSPITAL_COMMUNITY): Payer: Medicare Other

## 2011-03-13 ENCOUNTER — Encounter (HOSPITAL_COMMUNITY): Payer: Medicare Other

## 2011-03-18 ENCOUNTER — Encounter (HOSPITAL_COMMUNITY): Payer: Medicare Other

## 2011-03-20 ENCOUNTER — Encounter (HOSPITAL_COMMUNITY): Payer: Medicare Other

## 2011-03-25 ENCOUNTER — Encounter (HOSPITAL_COMMUNITY): Payer: Medicare Other | Attending: Internal Medicine

## 2011-03-25 DIAGNOSIS — G4730 Sleep apnea, unspecified: Secondary | ICD-10-CM | POA: Insufficient documentation

## 2011-03-25 DIAGNOSIS — J449 Chronic obstructive pulmonary disease, unspecified: Secondary | ICD-10-CM | POA: Insufficient documentation

## 2011-03-25 DIAGNOSIS — E785 Hyperlipidemia, unspecified: Secondary | ICD-10-CM | POA: Insufficient documentation

## 2011-03-25 DIAGNOSIS — Z5189 Encounter for other specified aftercare: Secondary | ICD-10-CM | POA: Insufficient documentation

## 2011-03-25 DIAGNOSIS — E678 Other specified hyperalimentation: Secondary | ICD-10-CM | POA: Insufficient documentation

## 2011-03-25 DIAGNOSIS — I4891 Unspecified atrial fibrillation: Secondary | ICD-10-CM | POA: Insufficient documentation

## 2011-03-25 DIAGNOSIS — J4489 Other specified chronic obstructive pulmonary disease: Secondary | ICD-10-CM | POA: Insufficient documentation

## 2011-03-25 DIAGNOSIS — R0609 Other forms of dyspnea: Secondary | ICD-10-CM | POA: Insufficient documentation

## 2011-03-25 DIAGNOSIS — I2510 Atherosclerotic heart disease of native coronary artery without angina pectoris: Secondary | ICD-10-CM | POA: Insufficient documentation

## 2011-03-25 DIAGNOSIS — R0989 Other specified symptoms and signs involving the circulatory and respiratory systems: Secondary | ICD-10-CM | POA: Insufficient documentation

## 2011-03-27 ENCOUNTER — Encounter (HOSPITAL_COMMUNITY): Payer: Medicare Other

## 2011-04-01 ENCOUNTER — Encounter (HOSPITAL_COMMUNITY): Payer: Medicare Other

## 2011-04-03 ENCOUNTER — Encounter (HOSPITAL_COMMUNITY): Payer: Medicare Other

## 2011-04-08 ENCOUNTER — Encounter (HOSPITAL_COMMUNITY): Payer: Medicare Other

## 2011-04-10 ENCOUNTER — Encounter (HOSPITAL_COMMUNITY): Payer: Medicare Other

## 2011-04-15 ENCOUNTER — Encounter (HOSPITAL_COMMUNITY): Payer: Medicare Other

## 2011-04-17 ENCOUNTER — Encounter (HOSPITAL_COMMUNITY): Payer: Medicare Other

## 2011-04-22 ENCOUNTER — Encounter (HOSPITAL_COMMUNITY): Payer: Medicare Other | Attending: Internal Medicine

## 2011-04-22 DIAGNOSIS — Z5189 Encounter for other specified aftercare: Secondary | ICD-10-CM | POA: Insufficient documentation

## 2011-04-22 DIAGNOSIS — E678 Other specified hyperalimentation: Secondary | ICD-10-CM | POA: Insufficient documentation

## 2011-04-22 DIAGNOSIS — J4489 Other specified chronic obstructive pulmonary disease: Secondary | ICD-10-CM | POA: Insufficient documentation

## 2011-04-22 DIAGNOSIS — E785 Hyperlipidemia, unspecified: Secondary | ICD-10-CM | POA: Insufficient documentation

## 2011-04-22 DIAGNOSIS — G4730 Sleep apnea, unspecified: Secondary | ICD-10-CM | POA: Insufficient documentation

## 2011-04-22 DIAGNOSIS — J449 Chronic obstructive pulmonary disease, unspecified: Secondary | ICD-10-CM | POA: Insufficient documentation

## 2011-04-22 DIAGNOSIS — R0989 Other specified symptoms and signs involving the circulatory and respiratory systems: Secondary | ICD-10-CM | POA: Insufficient documentation

## 2011-04-22 DIAGNOSIS — I2510 Atherosclerotic heart disease of native coronary artery without angina pectoris: Secondary | ICD-10-CM | POA: Insufficient documentation

## 2011-04-22 DIAGNOSIS — I4891 Unspecified atrial fibrillation: Secondary | ICD-10-CM | POA: Insufficient documentation

## 2011-04-22 DIAGNOSIS — R0609 Other forms of dyspnea: Secondary | ICD-10-CM | POA: Insufficient documentation

## 2011-04-24 ENCOUNTER — Encounter (HOSPITAL_COMMUNITY): Payer: Medicare Other

## 2011-04-29 ENCOUNTER — Encounter (HOSPITAL_COMMUNITY): Payer: Medicare Other

## 2011-04-29 ENCOUNTER — Other Ambulatory Visit: Payer: Self-pay | Admitting: *Deleted

## 2011-04-29 MED ORDER — BUDESONIDE-FORMOTEROL FUMARATE 160-4.5 MCG/ACT IN AERO: 2.0000 | INHALATION_SPRAY | Freq: Two times a day (BID) | RESPIRATORY_TRACT | Status: DC

## 2011-05-01 ENCOUNTER — Encounter (HOSPITAL_COMMUNITY): Payer: Medicare Other

## 2011-05-06 ENCOUNTER — Encounter (HOSPITAL_COMMUNITY): Payer: Medicare Other

## 2011-05-08 ENCOUNTER — Encounter (HOSPITAL_COMMUNITY): Payer: Medicare Other

## 2011-05-13 ENCOUNTER — Encounter (HOSPITAL_COMMUNITY): Payer: Medicare Other

## 2011-05-15 ENCOUNTER — Encounter (HOSPITAL_COMMUNITY): Payer: Medicare Other

## 2011-05-16 ENCOUNTER — Ambulatory Visit (INDEPENDENT_AMBULATORY_CARE_PROVIDER_SITE_OTHER): Payer: Medicare Other | Admitting: Internal Medicine

## 2011-05-16 ENCOUNTER — Encounter: Payer: Self-pay | Admitting: Internal Medicine

## 2011-05-16 VITALS — BP 122/74 | HR 69 | Ht 66.0 in | Wt 220.8 lb

## 2011-05-16 DIAGNOSIS — E678 Other specified hyperalimentation: Secondary | ICD-10-CM

## 2011-05-16 DIAGNOSIS — J449 Chronic obstructive pulmonary disease, unspecified: Secondary | ICD-10-CM

## 2011-05-16 DIAGNOSIS — G4730 Sleep apnea, unspecified: Secondary | ICD-10-CM

## 2011-05-16 DIAGNOSIS — J4489 Other specified chronic obstructive pulmonary disease: Secondary | ICD-10-CM

## 2011-05-16 NOTE — Progress Notes (Signed)
Patient ID: [name] Gill, male    DOB: 10/17/1925, 77 y.o.   MRN: [id number]  HPI 77 yoM followed for OSA and COPD complicated by CAD and AFib/ pacemaker.  with obesity hypoventilation syndrome. Last here May 15, 2010. Since then he finished Pulmonary Rehab and says it was life changing. Pacing himself he can now walk around the block with a few rest stops. We talked about ways to maintain the momentum. Otherwise quiet winter- had one respiratory infection- not bad. Heart ok- no new problems or changes by Dr Mayford Knife. FEV1 was 1.35/ 58% in 2011. Dr Pete Glatter did CXR during the winter.  He continues BIPAP 11/9 with O2 2 L/M, used at least 5-6 hours every night.  Denies routine cough, phlegm, chest pain, palpitation, bloody or purulent sputum.   05/16/11- 77 yoM followed for OSA and COPD complicated by CAD and AFib/ pacemaker, obesity hypoventilation syndrome. Wife is here. Had flu shot and shingles vaccine. He feels he is doing very well. Pulmonary rehabilitation was a big help and he has lost 28 pounds since April. He is now in the maintenance program. Comfortable off of oxygen in the daytime. Using Spiriva and Symbicort. Never aware of wheeze. Denies cough or chest pain. He has an old nebulizer machine and rescue inhaler that he never needs. He doesn't notice that he wheezes.  He continues BiPAP 11/9 with oxygen at 2 L, every night for at least 5 or 6 hours. His wife comments that he now stays asleep on long drives and she thinks he is doing very well.  Review of Systems See HPI Constitutional:   +Deliberate weight loss,   No-night sweats, fevers, chills, fatigue, lassitude. HEENT:   No-  headaches, difficulty swallowing, tooth/dental problems, sore throat,       No-  sneezing, itching, ear ache, nasal congestion, post nasal drip,  CV:  No-   chest pain, orthopnea, PND, swelling in lower extremities, anasarca, dizziness, palpitations Resp: No-   shortness of breath with exertion or at rest.                No-   productive cough,  No non-productive cough,  No- coughing up of blood.              No-   change in color of mucus.  No- wheezing.   Skin: No-   rash or lesions. GI:  No-   heartburn, indigestion, abdominal pain, nausea, vomiting, diarrhea,                 change in bowel habits, loss of appetite GU: No-   dysuria, change in color of urine, no urgency or frequency.  No- flank pain. MS:  No-   joint pain or swelling.  No- decreased range of motion.  No- back pain. Neuro-     nothing unusual Psych:  No- change in mood or affect. No depression or anxiety.  No memory loss.      Objective:   Physical Exam General- Alert, Oriented, Affect-appropriate, Distress- none acute. He remains very obese (wife is also very heavy). Skin- rash-none, lesions- none, excoriation- none Lymphadenopathy- none Head- atraumatic            Eyes- Gross vision intact, PERRLA, conjunctivae clear secretions            Ears- Hearing, canals-normal            Nose- Clear, no-Septal dev, mucus, polyps, erosion, perforation  Throat- Mallampati II , mucosa clear , drainage- none, tonsils- atrophic Neck- flexible , trachea midline, no stridor , thyroid nl, carotid no bruit Chest - symmetrical excursion , unlabored           Heart/CV- RRR , no murmur , no gallop  , no rub, nl s1 s2                           - JVD- none , edema- none, stasis changes- none, varices- none           Lung- there is bilateral inspiratory wheeze, unlabored,   cough- none , dullness-none, rub- none           Chest wall-  Abd- tender-no, distended-no, bowel sounds-present, HSM- no Br/ Gen/ Rectal- Not done, not indicated Extrem- cyanosis- none, clubbing, none, atrophy- none, strength- nl Neuro- grossly intact to observation

## 2011-05-16 NOTE — Assessment & Plan Note (Signed)
Bronchospasm is evident on exam. He is unlabored and doesn't feel it so I think he would not make effective use of a rescue inhaler. After considering some options we decided not to make changes. We will reassess at next visit.

## 2011-05-16 NOTE — Assessment & Plan Note (Signed)
Weight loss is the key here and hopefully he can continue.

## 2011-05-16 NOTE — Patient Instructions (Signed)
Continue present treatment  Please call as needed 

## 2011-05-16 NOTE — Progress Notes (Signed)
  Subjective:    Patient ID: [name] Gill, male    DOB: 1926-03-22, 77 y.o.   MRN: [id number]  HPI 6 yoM followed for OSA and COPD complicated by CAD and AFib/ pacemaker.  with obesity hypoventilation syndrome. Last here May 15, 2010. Since then he finished Pulmonary Rehab and says it was life changing. Pacing himself he can now walk around the block with a few rest stops. We talked about ways to maintain the momentum. Otherwise quiet winter- had one respiratory infection- not bad. Heart ok- no new problems or changes by Dr Mayford Knife. FEV1 was 1.35/ 58% in 2011. Dr Pete Glatter did CXR during the winter.  He continues BIPAP 11/9 with O2 2 L/M, used at least 5-6 hours every night.  Denies routine cough, phlegm, chest pain, palpitation, bloody or purulent sputum.    Review of Systems See HPI Constitutional:   No weight loss, night sweats,  Fevers, chills, fatigue, lassitude. HEENT:   No headaches,  Difficulty swallowing,  Tooth/dental problems,  Sore throat,                No sneezing, itching, ear ache, nasal congestion, post nasal drip,   CV:  No chest pain,  Orthopnea, PND, swelling in lower extremities, anasarca, dizziness, palpitations  GI  No heartburn, indigestion, abdominal pain, nausea, vomiting, diarrhea, change in bowel habits, loss of appetite  Resp: No excess mucus, no productive cough,  No non-productive cough,  No coughing up of blood.  No change in color of mucus.  No wheezing.    Skin: no rash or lesions.  GU: no dysuria, change in color of urine, no urgency or frequency.  No flank pain.  MS:  No joint pain or swelling.  No decreased range of motion.  No back pain.  Psych:  No change in mood or affect. No depression or anxiety.  No memory loss.      Objective:   Physical Exam General- Alert, Oriented, Affect-appropriate, Distress- none acute  Obese with prominent abdomen  Skin- rash-none, lesions- none, excoriation- none  Lymphadenopathy- none  Head-  atraumatic  Eyes- Gross vision intact, PERRLA, conjunctivae clearsecretions  Ears-  Hearing--aids,   Nose- Clear,  No- Septal dev, mucus, polyps, erosion, perforation   Throat- Mallampati II , mucosa clear , drainage- none, tonsils- atrophic. Upper denture, few remaining lower teeth  Neck- flexible , trachea midline, no stridor , thyroid nl, carotid no bruit  Chest -unlabored     Heart/CV- RRR , no murmur , no gallop  , no rub, nl s1 s2                     - JVD- 1+ , edema- tace at ankles, stasis changes- none, varices- none     Lung- clear to P&A, wheeze- none, cough- none , dullness-none, rub- none.  Shallow c/w obesity     Chest wall- pacemaker left  Abd- tender-no, distended-no, bowel sounds-present, HSM- no  Br/ Gen/ Rectal- Not done, not indicated  Extrem- cyanosis- none, clubbing, none, atrophy- none, strength- nl  Neuro- grossly intact to observation         Assessment & Plan:

## 2011-05-16 NOTE — Assessment & Plan Note (Addendum)
Good compliance and control. He will continue BiPAP 11/9 with oxygen at 2 L([employer]). He understands that weight loss would be important. I've encouraged him for what he has accomplished so far.

## 2011-05-20 ENCOUNTER — Encounter (HOSPITAL_COMMUNITY): Payer: Medicare Other

## 2011-05-22 ENCOUNTER — Encounter (HOSPITAL_COMMUNITY): Payer: Medicare Other

## 2011-05-22 DIAGNOSIS — I2510 Atherosclerotic heart disease of native coronary artery without angina pectoris: Secondary | ICD-10-CM | POA: Insufficient documentation

## 2011-05-22 DIAGNOSIS — J449 Chronic obstructive pulmonary disease, unspecified: Secondary | ICD-10-CM | POA: Insufficient documentation

## 2011-05-22 DIAGNOSIS — R0989 Other specified symptoms and signs involving the circulatory and respiratory systems: Secondary | ICD-10-CM | POA: Insufficient documentation

## 2011-05-22 DIAGNOSIS — J4489 Other specified chronic obstructive pulmonary disease: Secondary | ICD-10-CM | POA: Insufficient documentation

## 2011-05-22 DIAGNOSIS — R0609 Other forms of dyspnea: Secondary | ICD-10-CM | POA: Insufficient documentation

## 2011-05-22 DIAGNOSIS — G4730 Sleep apnea, unspecified: Secondary | ICD-10-CM | POA: Insufficient documentation

## 2011-05-22 DIAGNOSIS — E678 Other specified hyperalimentation: Secondary | ICD-10-CM | POA: Insufficient documentation

## 2011-05-22 DIAGNOSIS — Z5189 Encounter for other specified aftercare: Secondary | ICD-10-CM | POA: Insufficient documentation

## 2011-05-22 DIAGNOSIS — E785 Hyperlipidemia, unspecified: Secondary | ICD-10-CM | POA: Insufficient documentation

## 2011-05-22 DIAGNOSIS — I4891 Unspecified atrial fibrillation: Secondary | ICD-10-CM | POA: Insufficient documentation

## 2011-05-27 ENCOUNTER — Encounter (HOSPITAL_COMMUNITY): Payer: Medicare Other

## 2011-05-29 ENCOUNTER — Encounter (HOSPITAL_COMMUNITY): Payer: Medicare Other

## 2011-06-03 ENCOUNTER — Encounter (HOSPITAL_COMMUNITY): Payer: Medicare Other

## 2011-06-05 ENCOUNTER — Encounter (HOSPITAL_COMMUNITY)
Admission: RE | Admit: 2011-06-05 | Discharge: 2011-06-05 | Disposition: A | Discharge status: Home / Self Care | Payer: Medicare Other | Source: Ambulatory Visit | Attending: Internal Medicine | Admitting: Internal Medicine

## 2011-06-10 ENCOUNTER — Encounter (HOSPITAL_COMMUNITY)
Admission: RE | Admit: 2011-06-10 | Discharge: 2011-06-10 | Disposition: A | Discharge status: Home / Self Care | Payer: Medicare Other | Source: Ambulatory Visit | Attending: Internal Medicine | Admitting: Internal Medicine

## 2011-06-12 ENCOUNTER — Encounter (HOSPITAL_COMMUNITY): Payer: Medicare Other

## 2011-06-17 ENCOUNTER — Encounter (HOSPITAL_COMMUNITY)
Admission: RE | Admit: 2011-06-17 | Discharge: 2011-06-17 | Disposition: A | Discharge status: Home / Self Care | Payer: Medicare Other | Source: Ambulatory Visit | Attending: Internal Medicine | Admitting: Internal Medicine

## 2011-06-19 ENCOUNTER — Encounter (HOSPITAL_COMMUNITY)
Admission: RE | Admit: 2011-06-19 | Discharge: 2011-06-19 | Disposition: A | Discharge status: Home / Self Care | Payer: Medicare Other | Source: Ambulatory Visit | Attending: Internal Medicine | Admitting: Internal Medicine

## 2011-06-24 ENCOUNTER — Encounter (HOSPITAL_COMMUNITY)
Admission: RE | Admit: 2011-06-24 | Discharge: 2011-06-24 | Disposition: A | Discharge status: Home / Self Care | Payer: Medicare Other | Source: Ambulatory Visit | Attending: Internal Medicine | Admitting: Internal Medicine

## 2011-06-24 DIAGNOSIS — R0609 Other forms of dyspnea: Secondary | ICD-10-CM | POA: Insufficient documentation

## 2011-06-24 DIAGNOSIS — I2510 Atherosclerotic heart disease of native coronary artery without angina pectoris: Secondary | ICD-10-CM | POA: Insufficient documentation

## 2011-06-24 DIAGNOSIS — I4891 Unspecified atrial fibrillation: Secondary | ICD-10-CM | POA: Insufficient documentation

## 2011-06-24 DIAGNOSIS — E785 Hyperlipidemia, unspecified: Secondary | ICD-10-CM | POA: Insufficient documentation

## 2011-06-24 DIAGNOSIS — R0989 Other specified symptoms and signs involving the circulatory and respiratory systems: Secondary | ICD-10-CM | POA: Insufficient documentation

## 2011-06-24 DIAGNOSIS — J4489 Other specified chronic obstructive pulmonary disease: Secondary | ICD-10-CM | POA: Insufficient documentation

## 2011-06-24 DIAGNOSIS — E678 Other specified hyperalimentation: Secondary | ICD-10-CM | POA: Insufficient documentation

## 2011-06-24 DIAGNOSIS — G4730 Sleep apnea, unspecified: Secondary | ICD-10-CM | POA: Insufficient documentation

## 2011-06-24 DIAGNOSIS — Z5189 Encounter for other specified aftercare: Secondary | ICD-10-CM | POA: Insufficient documentation

## 2011-06-24 DIAGNOSIS — J449 Chronic obstructive pulmonary disease, unspecified: Secondary | ICD-10-CM | POA: Insufficient documentation

## 2011-06-26 ENCOUNTER — Encounter (HOSPITAL_COMMUNITY)
Admission: RE | Admit: 2011-06-26 | Discharge: 2011-06-26 | Disposition: A | Discharge status: Home / Self Care | Payer: Medicare Other | Source: Ambulatory Visit | Attending: Internal Medicine | Admitting: Internal Medicine

## 2011-07-01 ENCOUNTER — Encounter (HOSPITAL_COMMUNITY)
Admission: RE | Admit: 2011-07-01 | Discharge: 2011-07-01 | Disposition: A | Discharge status: Home / Self Care | Payer: Medicare Other | Source: Ambulatory Visit | Attending: Internal Medicine | Admitting: Internal Medicine

## 2011-07-03 ENCOUNTER — Encounter (HOSPITAL_COMMUNITY)
Admission: RE | Admit: 2011-07-03 | Discharge: 2011-07-03 | Disposition: A | Discharge status: Home / Self Care | Payer: Medicare Other | Source: Ambulatory Visit | Attending: Internal Medicine | Admitting: Internal Medicine

## 2011-07-08 ENCOUNTER — Encounter (HOSPITAL_COMMUNITY)
Admission: RE | Admit: 2011-07-08 | Discharge: 2011-07-08 | Disposition: A | Discharge status: Home / Self Care | Payer: Medicare Other | Source: Ambulatory Visit | Attending: Internal Medicine | Admitting: Internal Medicine

## 2011-07-10 ENCOUNTER — Encounter (HOSPITAL_COMMUNITY)
Admission: RE | Admit: 2011-07-10 | Discharge: 2011-07-10 | Disposition: A | Discharge status: Home / Self Care | Payer: Medicare Other | Source: Ambulatory Visit | Attending: Internal Medicine | Admitting: Internal Medicine

## 2011-07-24 ENCOUNTER — Encounter (HOSPITAL_COMMUNITY): Payer: Medicare Other

## 2011-07-24 DIAGNOSIS — Z5189 Encounter for other specified aftercare: Secondary | ICD-10-CM | POA: Insufficient documentation

## 2011-07-24 DIAGNOSIS — I4891 Unspecified atrial fibrillation: Secondary | ICD-10-CM | POA: Insufficient documentation

## 2011-07-24 DIAGNOSIS — R0989 Other specified symptoms and signs involving the circulatory and respiratory systems: Secondary | ICD-10-CM | POA: Insufficient documentation

## 2011-07-24 DIAGNOSIS — E785 Hyperlipidemia, unspecified: Secondary | ICD-10-CM | POA: Insufficient documentation

## 2011-07-24 DIAGNOSIS — I2510 Atherosclerotic heart disease of native coronary artery without angina pectoris: Secondary | ICD-10-CM | POA: Insufficient documentation

## 2011-07-24 DIAGNOSIS — R0609 Other forms of dyspnea: Secondary | ICD-10-CM | POA: Insufficient documentation

## 2011-07-24 DIAGNOSIS — E678 Other specified hyperalimentation: Secondary | ICD-10-CM | POA: Insufficient documentation

## 2011-07-24 DIAGNOSIS — G4730 Sleep apnea, unspecified: Secondary | ICD-10-CM | POA: Insufficient documentation

## 2011-07-24 DIAGNOSIS — J4489 Other specified chronic obstructive pulmonary disease: Secondary | ICD-10-CM | POA: Insufficient documentation

## 2011-07-24 DIAGNOSIS — J449 Chronic obstructive pulmonary disease, unspecified: Secondary | ICD-10-CM | POA: Insufficient documentation

## 2011-07-29 ENCOUNTER — Encounter (HOSPITAL_COMMUNITY): Payer: Medicare Other

## 2011-07-31 ENCOUNTER — Encounter (HOSPITAL_COMMUNITY)
Admission: RE | Admit: 2011-07-31 | Discharge: 2011-07-31 | Disposition: A | Discharge status: Home / Self Care | Payer: Medicare Other | Source: Ambulatory Visit | Attending: Internal Medicine | Admitting: Internal Medicine

## 2011-08-05 ENCOUNTER — Encounter (HOSPITAL_COMMUNITY)
Admission: RE | Admit: 2011-08-05 | Discharge: 2011-08-05 | Disposition: A | Discharge status: Home / Self Care | Payer: Medicare Other | Source: Ambulatory Visit | Attending: Internal Medicine | Admitting: Internal Medicine

## 2011-08-07 ENCOUNTER — Encounter (HOSPITAL_COMMUNITY)
Admission: RE | Admit: 2011-08-07 | Discharge: 2011-08-07 | Disposition: A | Discharge status: Home / Self Care | Payer: Medicare Other | Source: Ambulatory Visit | Attending: Internal Medicine | Admitting: Internal Medicine

## 2011-08-12 ENCOUNTER — Encounter (HOSPITAL_COMMUNITY)
Admission: RE | Admit: 2011-08-12 | Discharge: 2011-08-12 | Disposition: A | Discharge status: Home / Self Care | Payer: Medicare Other | Source: Ambulatory Visit | Attending: Internal Medicine | Admitting: Internal Medicine

## 2011-08-14 ENCOUNTER — Encounter (HOSPITAL_COMMUNITY)
Admission: RE | Admit: 2011-08-14 | Discharge: 2011-08-14 | Disposition: A | Discharge status: Home / Self Care | Payer: Medicare Other | Source: Ambulatory Visit | Attending: Internal Medicine | Admitting: Internal Medicine

## 2011-08-19 ENCOUNTER — Encounter (HOSPITAL_COMMUNITY)
Admission: RE | Admit: 2011-08-19 | Discharge: 2011-08-19 | Disposition: A | Discharge status: Home / Self Care | Payer: Medicare Other | Source: Ambulatory Visit | Attending: Internal Medicine | Admitting: Internal Medicine

## 2011-08-21 ENCOUNTER — Encounter (HOSPITAL_COMMUNITY)
Admission: RE | Admit: 2011-08-21 | Discharge: 2011-08-21 | Disposition: A | Discharge status: Home / Self Care | Payer: Medicare Other | Source: Ambulatory Visit | Attending: Internal Medicine | Admitting: Internal Medicine

## 2011-08-21 NOTE — Progress Notes (Signed)
Mr [name] Gill has requested to take one month leave of absence from [facility].  He plans to return on March 5th 2013.

## 2011-08-26 ENCOUNTER — Encounter (HOSPITAL_COMMUNITY): Payer: Medicare Other

## 2011-08-26 NOTE — Progress Notes (Signed)
Patient has graduated Pulmonary Rehab Maintenance program. Patient met all personal and program goals. Patient is more than welcome to come back to the Maintenance program as needed.

## 2011-08-28 ENCOUNTER — Encounter (HOSPITAL_COMMUNITY): Payer: Medicare Other

## 2011-09-02 ENCOUNTER — Encounter (HOSPITAL_COMMUNITY): Payer: Medicare Other

## 2011-09-04 ENCOUNTER — Encounter (HOSPITAL_COMMUNITY): Payer: Medicare Other

## 2011-09-09 ENCOUNTER — Encounter (HOSPITAL_COMMUNITY): Payer: Medicare Other

## 2011-09-11 ENCOUNTER — Encounter (HOSPITAL_COMMUNITY): Payer: Medicare Other

## 2011-09-16 ENCOUNTER — Encounter (HOSPITAL_COMMUNITY): Payer: Medicare Other

## 2011-09-18 ENCOUNTER — Encounter (HOSPITAL_COMMUNITY): Payer: Medicare Other

## 2011-09-23 ENCOUNTER — Encounter (HOSPITAL_COMMUNITY)
Admission: RE | Admit: 2011-09-23 | Discharge: 2011-09-23 | Disposition: A | Discharge status: Home / Self Care | Payer: Medicare Other | Source: Ambulatory Visit | Attending: Internal Medicine | Admitting: Internal Medicine

## 2011-09-23 DIAGNOSIS — J4489 Other specified chronic obstructive pulmonary disease: Secondary | ICD-10-CM | POA: Insufficient documentation

## 2011-09-23 DIAGNOSIS — E678 Other specified hyperalimentation: Secondary | ICD-10-CM | POA: Insufficient documentation

## 2011-09-23 DIAGNOSIS — I2510 Atherosclerotic heart disease of native coronary artery without angina pectoris: Secondary | ICD-10-CM | POA: Insufficient documentation

## 2011-09-23 DIAGNOSIS — R0989 Other specified symptoms and signs involving the circulatory and respiratory systems: Secondary | ICD-10-CM | POA: Insufficient documentation

## 2011-09-23 DIAGNOSIS — E785 Hyperlipidemia, unspecified: Secondary | ICD-10-CM | POA: Insufficient documentation

## 2011-09-23 DIAGNOSIS — Z5189 Encounter for other specified aftercare: Secondary | ICD-10-CM | POA: Insufficient documentation

## 2011-09-23 DIAGNOSIS — G4730 Sleep apnea, unspecified: Secondary | ICD-10-CM | POA: Insufficient documentation

## 2011-09-23 DIAGNOSIS — R0609 Other forms of dyspnea: Secondary | ICD-10-CM | POA: Insufficient documentation

## 2011-09-23 DIAGNOSIS — J449 Chronic obstructive pulmonary disease, unspecified: Secondary | ICD-10-CM | POA: Insufficient documentation

## 2011-09-23 DIAGNOSIS — I4891 Unspecified atrial fibrillation: Secondary | ICD-10-CM | POA: Insufficient documentation

## 2011-09-25 ENCOUNTER — Encounter (HOSPITAL_COMMUNITY)
Admission: RE | Admit: 2011-09-25 | Discharge: 2011-09-25 | Disposition: A | Discharge status: Home / Self Care | Payer: Medicare Other | Source: Ambulatory Visit | Attending: Internal Medicine | Admitting: Internal Medicine

## 2011-09-30 ENCOUNTER — Encounter (HOSPITAL_COMMUNITY)
Admission: RE | Admit: 2011-09-30 | Discharge: 2011-09-30 | Disposition: A | Discharge status: Home / Self Care | Payer: Medicare Other | Source: Ambulatory Visit | Attending: Internal Medicine | Admitting: Internal Medicine

## 2011-10-02 ENCOUNTER — Encounter (HOSPITAL_COMMUNITY)
Admission: RE | Admit: 2011-10-02 | Discharge: 2011-10-02 | Disposition: A | Discharge status: Home / Self Care | Payer: Medicare Other | Source: Ambulatory Visit | Attending: Internal Medicine | Admitting: Internal Medicine

## 2011-10-07 ENCOUNTER — Encounter (HOSPITAL_COMMUNITY)
Admission: RE | Admit: 2011-10-07 | Discharge: 2011-10-07 | Disposition: A | Discharge status: Home / Self Care | Payer: Medicare Other | Source: Ambulatory Visit | Attending: Internal Medicine | Admitting: Internal Medicine

## 2011-10-09 ENCOUNTER — Encounter (HOSPITAL_COMMUNITY)
Admission: RE | Admit: 2011-10-09 | Discharge: 2011-10-09 | Disposition: A | Discharge status: Home / Self Care | Payer: Medicare Other | Source: Ambulatory Visit | Attending: Internal Medicine | Admitting: Internal Medicine

## 2011-10-14 ENCOUNTER — Encounter (HOSPITAL_COMMUNITY)
Admission: RE | Admit: 2011-10-14 | Discharge: 2011-10-14 | Disposition: A | Discharge status: Home / Self Care | Payer: Medicare Other | Source: Ambulatory Visit | Attending: Internal Medicine | Admitting: Internal Medicine

## 2011-10-16 ENCOUNTER — Encounter (HOSPITAL_COMMUNITY)
Admission: RE | Admit: 2011-10-16 | Discharge: 2011-10-16 | Disposition: A | Discharge status: Home / Self Care | Payer: Medicare Other | Source: Ambulatory Visit | Attending: Internal Medicine | Admitting: Internal Medicine

## 2011-10-21 ENCOUNTER — Encounter (HOSPITAL_COMMUNITY)
Admission: RE | Admit: 2011-10-21 | Discharge: 2011-10-21 | Disposition: A | Discharge status: Home / Self Care | Payer: Self-pay | Source: Ambulatory Visit | Attending: Internal Medicine | Admitting: Internal Medicine

## 2011-10-21 DIAGNOSIS — R0609 Other forms of dyspnea: Secondary | ICD-10-CM | POA: Insufficient documentation

## 2011-10-21 DIAGNOSIS — J4489 Other specified chronic obstructive pulmonary disease: Secondary | ICD-10-CM | POA: Insufficient documentation

## 2011-10-21 DIAGNOSIS — J449 Chronic obstructive pulmonary disease, unspecified: Secondary | ICD-10-CM | POA: Insufficient documentation

## 2011-10-21 DIAGNOSIS — I4891 Unspecified atrial fibrillation: Secondary | ICD-10-CM | POA: Insufficient documentation

## 2011-10-21 DIAGNOSIS — E678 Other specified hyperalimentation: Secondary | ICD-10-CM | POA: Insufficient documentation

## 2011-10-21 DIAGNOSIS — R0989 Other specified symptoms and signs involving the circulatory and respiratory systems: Secondary | ICD-10-CM | POA: Insufficient documentation

## 2011-10-21 DIAGNOSIS — Z5189 Encounter for other specified aftercare: Secondary | ICD-10-CM | POA: Insufficient documentation

## 2011-10-21 DIAGNOSIS — E785 Hyperlipidemia, unspecified: Secondary | ICD-10-CM | POA: Insufficient documentation

## 2011-10-21 DIAGNOSIS — I2510 Atherosclerotic heart disease of native coronary artery without angina pectoris: Secondary | ICD-10-CM | POA: Insufficient documentation

## 2011-10-21 DIAGNOSIS — G4730 Sleep apnea, unspecified: Secondary | ICD-10-CM | POA: Insufficient documentation

## 2011-10-23 ENCOUNTER — Encounter (HOSPITAL_COMMUNITY)
Admission: RE | Admit: 2011-10-23 | Discharge: 2011-10-23 | Disposition: A | Discharge status: Home / Self Care | Payer: Self-pay | Source: Ambulatory Visit | Attending: Internal Medicine | Admitting: Internal Medicine

## 2011-10-28 ENCOUNTER — Encounter (HOSPITAL_COMMUNITY)
Admission: RE | Admit: 2011-10-28 | Discharge: 2011-10-28 | Disposition: A | Discharge status: Home / Self Care | Payer: Self-pay | Source: Ambulatory Visit | Attending: Internal Medicine | Admitting: Internal Medicine

## 2011-10-30 ENCOUNTER — Encounter (HOSPITAL_COMMUNITY)
Admission: RE | Admit: 2011-10-30 | Discharge: 2011-10-30 | Disposition: A | Discharge status: Home / Self Care | Payer: Self-pay | Source: Ambulatory Visit | Attending: Internal Medicine | Admitting: Internal Medicine

## 2011-11-04 ENCOUNTER — Encounter (HOSPITAL_COMMUNITY)
Admission: RE | Admit: 2011-11-04 | Discharge: 2011-11-04 | Disposition: A | Discharge status: Home / Self Care | Payer: Self-pay | Source: Ambulatory Visit | Attending: Internal Medicine | Admitting: Internal Medicine

## 2011-11-06 ENCOUNTER — Encounter (HOSPITAL_COMMUNITY)
Admission: RE | Admit: 2011-11-06 | Discharge: 2011-11-06 | Disposition: A | Discharge status: Home / Self Care | Payer: Self-pay | Source: Ambulatory Visit | Attending: Internal Medicine | Admitting: Internal Medicine

## 2011-11-11 ENCOUNTER — Encounter (HOSPITAL_COMMUNITY)
Admission: RE | Admit: 2011-11-11 | Discharge: 2011-11-11 | Disposition: A | Discharge status: Home / Self Care | Payer: Self-pay | Source: Ambulatory Visit | Attending: Internal Medicine | Admitting: Internal Medicine

## 2011-11-13 ENCOUNTER — Encounter (HOSPITAL_COMMUNITY)
Admission: RE | Admit: 2011-11-13 | Discharge: 2011-11-13 | Disposition: A | Discharge status: Home / Self Care | Payer: Self-pay | Source: Ambulatory Visit | Attending: Internal Medicine | Admitting: Internal Medicine

## 2011-11-14 ENCOUNTER — Ambulatory Visit (INDEPENDENT_AMBULATORY_CARE_PROVIDER_SITE_OTHER): Payer: Medicare Other | Admitting: Internal Medicine

## 2011-11-14 ENCOUNTER — Encounter: Payer: Self-pay | Admitting: Internal Medicine

## 2011-11-14 VITALS — BP 140/70 | HR 82 | Ht 66.0 in | Wt 225.8 lb

## 2011-11-14 DIAGNOSIS — J449 Chronic obstructive pulmonary disease, unspecified: Secondary | ICD-10-CM

## 2011-11-14 DIAGNOSIS — J310 Chronic rhinitis: Secondary | ICD-10-CM

## 2011-11-14 DIAGNOSIS — E678 Other specified hyperalimentation: Secondary | ICD-10-CM

## 2011-11-14 DIAGNOSIS — J4489 Other specified chronic obstructive pulmonary disease: Secondary | ICD-10-CM

## 2011-11-14 DIAGNOSIS — G4730 Sleep apnea, unspecified: Secondary | ICD-10-CM

## 2011-11-14 MED ORDER — ALBUTEROL SULFATE HFA 108 (90 BASE) MCG/ACT IN AERS: 2.0000 | INHALATION_SPRAY | Freq: Four times a day (QID) | RESPIRATORY_TRACT | Status: DC | PRN

## 2011-11-14 NOTE — Patient Instructions (Signed)
Script for albuterol rescue inhaler, to use if you need extra help during the day occasionally, beyond your Symbicort and Spiriva.  Continue BiPAP 11/9  Consider trying a saline nasal gel to protect the lining of your nose from drying and bleeding. Use as often as you want.   Try sample nasal steroid spray- Qnasl-  1 or 2 puffs in each nostril every night at bedtime. See if this reduces stuffiness in your nose.

## 2011-11-14 NOTE — Progress Notes (Signed)
Patient ID: [name] Gill, male    DOB: [birth year], 78 y.o.   MRN: [id number]  HPI 3 yoM followed for OSA and COPD complicated by CAD and AFib/ pacemaker.  with obesity hypoventilation syndrome. Last here May 15, 2010. Since then he finished Pulmonary Rehab and says it was life changing. Pacing himself he can now walk around the block with a few rest stops. We talked about ways to maintain the momentum. Otherwise quiet winter- had one respiratory infection- not bad. Heart ok- no new problems or changes by Dr Mayford Knife. FEV1 was 1.35/ 58% in 2011. Dr Pete Glatter did CXR during the winter.  He continues BIPAP 11/9 with O2 2 L/M, used at least 5-6 hours every night.  Denies routine cough, phlegm, chest pain, palpitation, bloody or purulent sputum.   05/16/11- 84 yoM followed for OSA and COPD complicated by CAD and AFib/ pacemaker, obesity hypoventilation syndrome. Wife is here. Had flu shot and shingles vaccine. He feels he is doing very well. Pulmonary rehabilitation was a big help and he has lost 28 pounds since April. He is now in the maintenance program. Comfortable off of oxygen in the daytime. Using Spiriva and Symbicort. Never aware of wheeze. Denies cough or chest pain. He has an old nebulizer machine and rescue inhaler that he never needs. He doesn't notice that he wheezes.  He continues BiPAP 11/9 with oxygen at 2 L, every night for at least 5 or 6 hours. His wife comments that he now stays asleep on long drives and she thinks he is doing very well.  11/14/11- 85 yoM followed for OSA and COPD complicated by CAD and AFib/ pacemaker, obesity hypoventilation syndrome. He continues to use BiPAP with inspiratory pressure 11/expiratory pressure 9 and says he is doing well with this. He considers his COPD status "wonderful". Pulmonary rehabilitation as a big help and has made a significant difference in his strength over the past year. Has lost some weight. Little cough or wheeze. Sleeps with oxygen 2  L. Using Symbicort twice daily and Spiriva. Occasional epistaxis and nasal stuffiness attributed to old nasal trauma.   Review of Systems-See HPI Constitutional:   +Deliberate weight loss,   No-night sweats, fevers, chills, fatigue, lassitude. HEENT:   No-  headaches, difficulty swallowing, tooth/dental problems, sore throat,       No-  sneezing, itching, ear ache, nasal congestion, post nasal drip,  CV:  No-   chest pain, orthopnea, PND, swelling in lower extremities, anasarca, dizziness, palpitations Resp: No-   shortness of breath with exertion or at rest.              No-   productive cough,  No non-productive cough,  No- coughing up of blood.              No-   change in color of mucus.  No- wheezing.   Skin: No-   rash or lesions. GI:  No-   heartburn, indigestion, abdominal pain, nausea, vomiting,  GU:  MS:  No-   joint pain or swelling.   Neuro-     nothing unusual Psych:  No- change in mood or affect. No depression or anxiety.  No memory loss.      Objective:   Physical Exam General- Alert, Oriented, Affect-appropriate, Distress- none acute. He remains very obese (wife is also very heavy). Skin- rash-none, lesions- none, excoriation- none Lymphadenopathy- none Head- atraumatic            Eyes- Gross vision intact,  PERRLA, conjunctivae clear secretions            Ears- Hearing, canals-normal            Nose- crusting small scab, +Septal dev, mucus, polyps, erosion, perforation             Throat- Mallampati III-IV , mucosa clear , drainage- none, tonsils- atrophic Neck- flexible , trachea midline, no stridor , thyroid nl, carotid no bruit Chest - symmetrical excursion , unlabored           Heart/CV- IRR/ AFib , no murmur , no gallop  , no rub, nl s1 s2                           - JVD- none , edema- trace, stasis changes- none, varices- none           Lung- almost clear,   cough- none , dullness-none, rub- none           Chest wall-  Abd- tender-no, distended-no, bowel  sounds-present, HSM- no Br/ Gen/ Rectal- Not done, not indicated Extrem- cyanosis- none, clubbing, none, atrophy- none, strength- nl Neuro- grossly intact to observation

## 2011-11-18 ENCOUNTER — Telehealth: Payer: Self-pay | Admitting: Internal Medicine

## 2011-11-18 ENCOUNTER — Ambulatory Visit (INDEPENDENT_AMBULATORY_CARE_PROVIDER_SITE_OTHER)
Admission: RE | Admit: 2011-11-18 | Discharge: 2011-11-18 | Disposition: A | Discharge status: Home / Self Care | Payer: Medicare Other | Source: Ambulatory Visit | Attending: Internal Medicine | Admitting: Internal Medicine

## 2011-11-18 ENCOUNTER — Encounter (HOSPITAL_COMMUNITY)
Admission: RE | Admit: 2011-11-18 | Discharge: 2011-11-18 | Disposition: A | Discharge status: Home / Self Care | Payer: Self-pay | Source: Ambulatory Visit | Attending: Internal Medicine | Admitting: Internal Medicine

## 2011-11-18 ENCOUNTER — Encounter: Payer: Self-pay | Admitting: Internal Medicine

## 2011-11-18 ENCOUNTER — Other Ambulatory Visit (INDEPENDENT_AMBULATORY_CARE_PROVIDER_SITE_OTHER): Payer: Medicare Other

## 2011-11-18 ENCOUNTER — Ambulatory Visit (INDEPENDENT_AMBULATORY_CARE_PROVIDER_SITE_OTHER): Payer: Medicare Other | Admitting: Internal Medicine

## 2011-11-18 VITALS — BP 130/68 | HR 85 | Ht 66.0 in | Wt 222.8 lb

## 2011-11-18 DIAGNOSIS — K529 Noninfective gastroenteritis and colitis, unspecified: Secondary | ICD-10-CM

## 2011-11-18 DIAGNOSIS — K5289 Other specified noninfective gastroenteritis and colitis: Secondary | ICD-10-CM

## 2011-11-18 DIAGNOSIS — R0602 Shortness of breath: Secondary | ICD-10-CM

## 2011-11-18 DIAGNOSIS — J449 Chronic obstructive pulmonary disease, unspecified: Secondary | ICD-10-CM

## 2011-11-18 DIAGNOSIS — I4891 Unspecified atrial fibrillation: Secondary | ICD-10-CM

## 2011-11-18 DIAGNOSIS — G4730 Sleep apnea, unspecified: Secondary | ICD-10-CM

## 2011-11-18 DIAGNOSIS — E678 Other specified hyperalimentation: Secondary | ICD-10-CM

## 2011-11-18 DIAGNOSIS — J310 Chronic rhinitis: Secondary | ICD-10-CM | POA: Insufficient documentation

## 2011-11-18 LAB — CBC WITH DIFFERENTIAL/PLATELET
Basophils Absolute: 0.1 10*3/uL (ref 0.0–0.1)
Basophils Relative: 0.7 % (ref 0.0–3.0)
Eosinophils Absolute: 0.2 10*3/uL (ref 0.0–0.7)
Hemoglobin: 12.6 g/dL — ABNORMAL LOW (ref 13.0–17.0)
Lymphocytes Relative: 12.2 % (ref 12.0–46.0)
MCHC: 32.7 g/dL (ref 30.0–36.0)
MCV: 94.7 fl (ref 78.0–100.0)
Monocytes Absolute: 0.7 10*3/uL (ref 0.1–1.0)
Neutro Abs: 5.2 10*3/uL (ref 1.4–7.7)
RBC: 4.08 Mil/uL — ABNORMAL LOW (ref 4.22–5.81)
RDW: 16.2 % — ABNORMAL HIGH (ref 11.5–14.6)

## 2011-11-18 NOTE — Progress Notes (Signed)
[name], MD 11/18/2011 9:09 PM Pended  Patient ID: [name] Gill, male DOB: 1925-08-06, 78 y.o. MRN: [id number]  HPI  51 yoM followed for OSA and COPD complicated by CAD and AFib/ pacemaker. with obesity hypoventilation syndrome. Last here May 15, 2010. Since then he finished Pulmonary Rehab and says it was life changing. Pacing himself he can now walk around the block with a few rest stops. We talked about ways to maintain the momentum. Otherwise quiet winter- had one respiratory infection- not bad. Heart ok- no new problems or changes by Dr Mayford Knife. FEV1 was 1.35/ 58% in 2011. Dr Pete Glatter did CXR during the winter.  He continues BIPAP 11/9 with O2 2 L/M, used at least 5-6 hours every night.  Denies routine cough, phlegm, chest pain, palpitation, bloody or purulent sputum.   05/16/11- 78 yoM followed for OSA and COPD complicated by CAD and AFib/ pacemaker, obesity hypoventilation syndrome.  Wife is here. Had flu shot and shingles vaccine. He feels he is doing very well. Pulmonary rehabilitation was a big help and he has lost 28 pounds since April. He is now in the maintenance program. Comfortable off of oxygen in the daytime. Using Spiriva and Symbicort. Never aware of wheeze. Denies cough or chest pain. He has an old nebulizer machine and rescue inhaler that he never needs. He doesn't notice that he wheezes.  He continues BiPAP 11/9 with oxygen at 2 L, every night for at least 5 or 6 hours. His wife comments that he now stays asleep on long drives and she thinks he is doing very well.   11/14/11- 78 yoM followed for OSA and COPD complicated by CAD and AFib/ pacemaker, obesity hypoventilation syndrome.  He continues to use BiPAP with inspiratory pressure 11/expiratory pressure 9 and says he is doing well with this.  He considers his COPD status "wonderful". Pulmonary rehabilitation as a big help and has made a significant difference in his strength over the past year. Has lost some weight.  Little cough or wheeze. Sleeps with oxygen 2 L. Using Symbicort twice daily and Spiriva. Occasional epistaxis and nasal stuffiness attributed to old nasal trauma.  11/18/11- 78 yoM followed for OSA and COPD complicated by CAD and AFib/ pacemaker, obesity hypoventilation syndrome.  PCP Dr Pete Glatter Just here 4 days ago. Wife brings him in today because he didn't tell me at last visit about recent problems she wanted me to know about. Oxygen saturation had been running 96-98% but has been lower in the past few days. Malaise. Had a gastroenteritis syndrome February 27 and similar March 21. In the last few days has felt tired, chills, feet swelling, stomach bloating. More short of breath off and on. Denies pain, fever, purulent discharge, leg pain or cramps. Was taking AndroGel but stopped last week. Has been eating a lot of licorice, up to 20 sticks/ day over the past month, initially to treat constipation. Then he just kept on because he liked it. Has history of mild renal insufficiency. Has been eating a banana a day and potassium supplement. He reports that the pulmonary rehabilitation nurse thought he might be fluid overloaded. In the last 2 days he had been eating boullion, accepting salt input in order to lose weight by avoiding sandwiches.   Review of Systems-See HPI Constitutional:   +Deliberate weight loss,   No-night sweats, fevers, chills, fatigue, lassitude. HEENT:   No-  headaches, difficulty swallowing, tooth/dental problems, sore throat,       No-  sneezing, itching, ear  ache, nasal congestion, post nasal drip,  CV:  No-   chest pain, orthopnea, PND, +swelling in lower extremities,  No-anasarca, dizziness, palpitations Resp: +  shortness of breath with exertion or at rest.              No-   productive cough,  No non-productive cough,  No- coughing up of blood.              No-   change in color of mucus.  No- wheezing.   Skin: No-   rash or lesions. GI:  No-   heartburn, indigestion,  abdominal pain, nausea, vomiting,  +diarrhea,                 change in bowel habits, loss of appetite GU: No-   dysuria, change in color of urine, no urgency or frequency.  No- flank pain. MS:  No-   joint pain or swelling.  No- decreased range of motion.  No- back pain. Neuro-     nothing unusual Psych:  No- change in mood or affect. No depression or anxiety.  No memory loss.      Objective:   Physical Exam General- Alert, Oriented, Affect-appropriate, Distress- none acute. He remains very obese (wife is also very heavy). Skin- rash-none, lesions- none, excoriation- none Lymphadenopathy- none Head- atraumatic            Eyes- Gross vision intact, PERRLA, conjunctivae clear secretions            Ears- Hearing, canals-normal            Nose- Clear, no-Septal dev, mucus, polyps, erosion, perforation             Throat- Mallampati II , mucosa clear , drainage- none, tonsils- atrophic Neck- flexible , trachea midline, no stridor , thyroid nl, carotid no bruit Chest - symmetrical excursion , unlabored           Heart/CV- IRR , no murmur , no gallop  , no rub, nl s1 s2                           - JVD +1cm , edema- 1+, stasis changes- none, varices- none           Lung- there is bilateral inspiratory wheeze, unlabored,   cough- none , dullness-none, rub- none           Chest wall-  Abd- tender-no, distended obese, bowel sounds-active, HSM- no Br/ Gen/ Rectal- Not done, not indicated Extrem- cyanosis- none, clubbing, none, atrophy- none, strength- nl Neuro- grossly intact to observation

## 2011-11-18 NOTE — Assessment & Plan Note (Signed)
Cannot exclude some degree of fluid retention. His electrolyte status may be very complicated. It sounds as if he is eating enough licorice to cause a mineralocorticoid syndrome with hypokalemia. This may be counterbalanced by his banana, potassium supplement and renal insufficiency or aggravated by his Lasix. Plan-chest x-ray CBC, chemistry profile. We'll stop licorice. This may allow fluid overload to correct itself, especially if he avoids boullion with salt.

## 2011-11-18 NOTE — Assessment & Plan Note (Signed)
Obvious abdominal crowding of his diaphragm and lung excursion

## 2011-11-18 NOTE — Telephone Encounter (Signed)
I spoke with Misty Stanley form pulm rehab and she states pt c/o increase SOB, oxygen level drops down to 89% when he exerts himself, lower extremity edema. They are requesting be seen today by CDY. Spoke with Florentina Addison and she states pt can come in at 3:45 today. I called lisa back and made her aware of pt apt time. She states she will let the pt know and nothing further was needed

## 2011-11-18 NOTE — Assessment & Plan Note (Signed)
Continues good compliance and control 

## 2011-11-18 NOTE — Assessment & Plan Note (Signed)
Well controlled with Symbicort and Spiriva. Oxygen for sleep. I recommended he have current prescription for rescue inhaler which he has not been using.

## 2011-11-18 NOTE — Assessment & Plan Note (Signed)
Partly due to drying. Cannot exclude allergy component. Plan-saline gel. Nasal steroid spray.

## 2011-11-18 NOTE — Assessment & Plan Note (Signed)
Good compliance and control 

## 2011-11-18 NOTE — Progress Notes (Signed)
[name] Gill came to [facility] this morning.   As he was beginning his first exercise station, he told staff member he was really short of breath. His check in  Oxygen sat was 92% at rest, heart rate 96.  His usual is oxygen sat @95 % or above.  Also noted his heart rate at 96, usual 80 to 85 .  He was ambulated 20 feet and oxygen fell to 88%.  Complained of increased shortness of breath since Friday.  States this all occurred after his visit to Dr. Maple Hudson.  Breath sounds distant  He was noted to have increased lower extremity pitting edema 2-3+ bilateral.  Dr. Roxy Cedar office notified and he was given an appointment for this afternoon.  He was taken to his car by wheelchair due to his decreased oxygen saturations and dyspnea.

## 2011-11-18 NOTE — Patient Instructions (Signed)
Order- CXR- COPD                          Lab- CBC w/ diff, BMET, hepatic panel,      Dx gastroenteritis  Stop the licorice

## 2011-11-18 NOTE — Assessment & Plan Note (Signed)
By clinical exam he is in atrial fibrillation with rate control. He does have a pacemaker.

## 2011-11-18 NOTE — Assessment & Plan Note (Signed)
He is trying to lose a few pounds which will help.

## 2011-11-18 NOTE — Assessment & Plan Note (Signed)
Initially problem was constipation which he chose to treat with large amounts of liquor as. Subsequently complaints have been malaise, abdominal bloating, some loose stools. I can't distinguish among viral gastroenteritis, diverticulitis or other nonspecific discomforts. I am concerned about the amount of licorice he has been eating and the possibility that hypokalemia is causing some of his GI distress. Plan check electrolytes. Stop licorice.

## 2011-11-19 ENCOUNTER — Telehealth: Payer: Self-pay | Admitting: Internal Medicine

## 2011-11-19 LAB — BASIC METABOLIC PANEL
BUN: 19 mg/dL (ref 6–23)
Calcium: 8.8 mg/dL (ref 8.4–10.5)
Creatinine, Ser: 1.2 mg/dL (ref 0.4–1.5)
GFR: 63.79 mL/min (ref >60.00)

## 2011-11-19 LAB — HEPATIC FUNCTION PANEL
Bilirubin, Direct: 0.2 mg/dL (ref 0.0–0.3)
Total Bilirubin: 1.1 mg/dL (ref 0.3–1.2)

## 2011-11-19 NOTE — Progress Notes (Signed)
Quick Note:  LMTCB ______ 

## 2011-11-19 NOTE — Telephone Encounter (Signed)
Notes Recorded by [name], MD on 11/18/2011 at 5:07 PM CXR- Mild fluid overload in lower lungs. Stay off licorice and see if the routine lasix is enough to correct this.   I spoke with patient about results and he verbalized understanding and had no questions

## 2011-11-20 ENCOUNTER — Encounter (HOSPITAL_COMMUNITY): Admission: RE | Admit: 2011-11-20 | Payer: Self-pay | Source: Ambulatory Visit

## 2011-11-25 ENCOUNTER — Encounter (HOSPITAL_COMMUNITY)
Admission: RE | Admit: 2011-11-25 | Discharge: 2011-11-25 | Disposition: A | Discharge status: Home / Self Care | Payer: Self-pay | Source: Ambulatory Visit | Attending: Internal Medicine | Admitting: Internal Medicine

## 2011-11-25 DIAGNOSIS — G4730 Sleep apnea, unspecified: Secondary | ICD-10-CM | POA: Insufficient documentation

## 2011-11-25 DIAGNOSIS — I2510 Atherosclerotic heart disease of native coronary artery without angina pectoris: Secondary | ICD-10-CM | POA: Insufficient documentation

## 2011-11-25 DIAGNOSIS — I4891 Unspecified atrial fibrillation: Secondary | ICD-10-CM | POA: Insufficient documentation

## 2011-11-25 DIAGNOSIS — E678 Other specified hyperalimentation: Secondary | ICD-10-CM | POA: Insufficient documentation

## 2011-11-25 DIAGNOSIS — J4489 Other specified chronic obstructive pulmonary disease: Secondary | ICD-10-CM | POA: Insufficient documentation

## 2011-11-25 DIAGNOSIS — E785 Hyperlipidemia, unspecified: Secondary | ICD-10-CM | POA: Insufficient documentation

## 2011-11-25 DIAGNOSIS — R0609 Other forms of dyspnea: Secondary | ICD-10-CM | POA: Insufficient documentation

## 2011-11-25 DIAGNOSIS — Z5189 Encounter for other specified aftercare: Secondary | ICD-10-CM | POA: Insufficient documentation

## 2011-11-25 DIAGNOSIS — J449 Chronic obstructive pulmonary disease, unspecified: Secondary | ICD-10-CM | POA: Insufficient documentation

## 2011-11-25 DIAGNOSIS — R0989 Other specified symptoms and signs involving the circulatory and respiratory systems: Secondary | ICD-10-CM | POA: Insufficient documentation

## 2011-11-26 ENCOUNTER — Encounter: Payer: Self-pay | Admitting: Internal Medicine

## 2011-11-27 ENCOUNTER — Encounter (HOSPITAL_COMMUNITY)
Admission: RE | Admit: 2011-11-27 | Discharge: 2011-11-27 | Disposition: A | Discharge status: Home / Self Care | Payer: Self-pay | Source: Ambulatory Visit | Attending: Internal Medicine | Admitting: Internal Medicine

## 2011-12-02 ENCOUNTER — Encounter (HOSPITAL_COMMUNITY)
Admission: RE | Admit: 2011-12-02 | Discharge: 2011-12-02 | Disposition: A | Discharge status: Home / Self Care | Payer: Self-pay | Source: Ambulatory Visit | Attending: Internal Medicine | Admitting: Internal Medicine

## 2011-12-04 ENCOUNTER — Encounter (HOSPITAL_COMMUNITY)
Admission: RE | Admit: 2011-12-04 | Discharge: 2011-12-04 | Disposition: A | Discharge status: Home / Self Care | Payer: Self-pay | Source: Ambulatory Visit | Attending: Internal Medicine | Admitting: Internal Medicine

## 2011-12-09 ENCOUNTER — Encounter (HOSPITAL_COMMUNITY)
Admission: RE | Admit: 2011-12-09 | Discharge: 2011-12-09 | Disposition: A | Discharge status: Home / Self Care | Payer: Self-pay | Source: Ambulatory Visit | Attending: Internal Medicine | Admitting: Internal Medicine

## 2011-12-11 ENCOUNTER — Encounter (HOSPITAL_COMMUNITY)
Admission: RE | Admit: 2011-12-11 | Discharge: 2011-12-11 | Disposition: A | Discharge status: Home / Self Care | Payer: Self-pay | Source: Ambulatory Visit | Attending: Internal Medicine | Admitting: Internal Medicine

## 2011-12-16 ENCOUNTER — Encounter (HOSPITAL_COMMUNITY)
Admission: RE | Admit: 2011-12-16 | Discharge: 2011-12-16 | Disposition: A | Discharge status: Home / Self Care | Payer: Self-pay | Source: Ambulatory Visit | Attending: Internal Medicine | Admitting: Internal Medicine

## 2011-12-18 ENCOUNTER — Encounter (HOSPITAL_COMMUNITY)
Admission: RE | Admit: 2011-12-18 | Discharge: 2011-12-18 | Disposition: A | Discharge status: Home / Self Care | Payer: Self-pay | Source: Ambulatory Visit | Attending: Internal Medicine | Admitting: Internal Medicine

## 2011-12-23 ENCOUNTER — Encounter (HOSPITAL_COMMUNITY)
Admission: RE | Admit: 2011-12-23 | Discharge: 2011-12-23 | Disposition: A | Discharge status: Home / Self Care | Payer: Self-pay | Source: Ambulatory Visit | Attending: Internal Medicine | Admitting: Internal Medicine

## 2011-12-23 DIAGNOSIS — Z5189 Encounter for other specified aftercare: Secondary | ICD-10-CM | POA: Insufficient documentation

## 2011-12-23 DIAGNOSIS — R0609 Other forms of dyspnea: Secondary | ICD-10-CM | POA: Insufficient documentation

## 2011-12-23 DIAGNOSIS — J449 Chronic obstructive pulmonary disease, unspecified: Secondary | ICD-10-CM | POA: Insufficient documentation

## 2011-12-23 DIAGNOSIS — R0989 Other specified symptoms and signs involving the circulatory and respiratory systems: Secondary | ICD-10-CM | POA: Insufficient documentation

## 2011-12-23 DIAGNOSIS — G4730 Sleep apnea, unspecified: Secondary | ICD-10-CM | POA: Insufficient documentation

## 2011-12-23 DIAGNOSIS — J4489 Other specified chronic obstructive pulmonary disease: Secondary | ICD-10-CM | POA: Insufficient documentation

## 2011-12-23 DIAGNOSIS — I2510 Atherosclerotic heart disease of native coronary artery without angina pectoris: Secondary | ICD-10-CM | POA: Insufficient documentation

## 2011-12-23 DIAGNOSIS — E785 Hyperlipidemia, unspecified: Secondary | ICD-10-CM | POA: Insufficient documentation

## 2011-12-23 DIAGNOSIS — I4891 Unspecified atrial fibrillation: Secondary | ICD-10-CM | POA: Insufficient documentation

## 2011-12-23 DIAGNOSIS — E678 Other specified hyperalimentation: Secondary | ICD-10-CM | POA: Insufficient documentation

## 2011-12-25 ENCOUNTER — Ambulatory Visit (INDEPENDENT_AMBULATORY_CARE_PROVIDER_SITE_OTHER): Payer: Medicare Other | Admitting: Internal Medicine

## 2011-12-25 ENCOUNTER — Encounter: Payer: Self-pay | Admitting: Internal Medicine

## 2011-12-25 ENCOUNTER — Institutional Professional Consult (permissible substitution): Payer: Medicare Other | Admitting: Internal Medicine

## 2011-12-25 ENCOUNTER — Encounter (HOSPITAL_COMMUNITY)
Admission: RE | Admit: 2011-12-25 | Discharge: 2011-12-25 | Disposition: A | Discharge status: Home / Self Care | Payer: Self-pay | Source: Ambulatory Visit | Attending: Internal Medicine | Admitting: Internal Medicine

## 2011-12-25 VITALS — BP 106/60 | HR 70 | Ht 66.0 in | Wt 204.0 lb

## 2011-12-25 DIAGNOSIS — Z950 Presence of cardiac pacemaker: Secondary | ICD-10-CM

## 2011-12-25 DIAGNOSIS — I4891 Unspecified atrial fibrillation: Secondary | ICD-10-CM

## 2011-12-25 DIAGNOSIS — N289 Disorder of kidney and ureter, unspecified: Secondary | ICD-10-CM

## 2011-12-25 DIAGNOSIS — I472 Ventricular tachycardia, unspecified: Secondary | ICD-10-CM

## 2011-12-25 DIAGNOSIS — I4729 Other ventricular tachycardia: Secondary | ICD-10-CM | POA: Insufficient documentation

## 2011-12-25 NOTE — Assessment & Plan Note (Addendum)
It is not clear what his current renal function is. His creatinines ranging from 1.1 up to 2 point something. The lower numbers were seen more recently. This may impact on the decision regarding oral anticoagulants

## 2011-12-25 NOTE — Assessment & Plan Note (Signed)
Patient has paroxysmal atrial fibrillation. In the past he had rapid rates. His recent episodes have also this is modestly rapid ventricular response at.   He had some episodes of lightheadedness and I wonder whether there is a correlation with these episodes of atrial fibrillation; he has not sufficiently aware of events noted that.  He is unaware of his atrial fibrillation. Hence given the ventricular tachycardia, the concern of which is proarrhythmia from sotalol, I am inclined to have him discontinue it. Alternative beta blockers,-more selective, to be used to augment rate control as necessary.  In addition, there are recent data from the Aristotle trial suggesting that in patients with modest renal insufficiency that apixoban may be safer than warfarin as well as more effective than warfarin. It would be my recommendation that we change his medication for this reason.

## 2011-12-25 NOTE — Patient Instructions (Signed)
Your physician has recommended you make the following change in your medication:  1) Stop sotalol.  We will see you back on an as needed basis.

## 2011-12-25 NOTE — Assessment & Plan Note (Signed)
The patient's device was interrogated.  The information was reviewed. No changes were made in the programming.    

## 2011-12-25 NOTE — Progress Notes (Signed)
ELECTROPHYSIOLOGY CONSULT NOTE  Patient ID: [name] Gill, MRN: [id number], DOB/AGE: 1925-09-12 78 y.o. Admit date: (Not on file) Date of Consult: 12/25/2011  Primary Physician: Ginette Otto, MD, MD Primary Cardiologist: TT  Chief Complaint:  VT   HPI [name] Gill is a 78 y.o. male: Referred for concern regarding ventricular tachycardia identify a pacemaker in the setting of sotalol therapy.  The patient has ischemic heart disease status post bypass surgery in 2009 or so. He moved from [place] to [place] and was admitted to the hospital for atrial fibrillation with a rapid response. He was treated and started on sotalol. He then developed bradycardia probably pacemaker implantation later that fall. Sotalol was reinitiated. The patient is not aware of his health issues and was not sure about indication for pacing the role sotalol and its relationship to atrial fibrillation.    In any case, ventricular tachycardia was identified on his pacemaker and he is referred for concerns about pro arrhythmia.  He also notes episodes of lightheadedness recently that occurred in the same timeframe has episodes identified on his device as having more atrial fibrillation. He was seen 3 or 4 months ago for a similar episode and was told he was "dehydrated". There was no atrial fibrillation associated with.  He also saw Dr. Maple Hudson recently for symptoms of bloating. He was. He is encouraged. Laboratories at that time actually looked terrific as if this relates to his renal function which was essentially normal.  Past Medical History  Diagnosis Date  . OSA (obstructive sleep apnea)     04-04-08 NPSG AHI 14.5, 3L/M suppl O2 for nadir 86%  . Atrial fibrillation     coumadin, pacemaker  . Diverticulitis 1975  . CHD (coronary heart disease)   . Hypertension   . COPD (chronic obstructive pulmonary disease)     FEV1 1.35/58% 01-11-10  . Hyperlipidemia   . Pacemaker -Medtronic     Medtronic  .  Renal insufficiency   . Ventricular tachycardia, non-sustained     Detected on the device      Surgical History:  Past Surgical History  Procedure Date  . Broken nose   . Coronary artery bypass graft 4098,1191  . Pacemaker insertion 02/2008    medtronic     Home Meds: Prior to Admission medications   Medication Sig Start Date End Date Taking? Authorizing Provider  albuterol (PROVENTIL HFA;VENTOLIN HFA) 108 (90 BASE) MCG/ACT inhaler Inhale 2 puffs into the lungs every 6 (six) hours as needed for wheezing or shortness of breath. Rescue 11/14/11 11/13/12 Yes [name], MD  Ascorbic Acid (VITAMIN C) 500 MG tablet Take 500 mg by mouth daily.     Yes Historical Provider, MD  beta carotene w/minerals (OCUVITE) tablet Take 1 tablet by mouth daily.     Yes Historical Provider, MD  budesonide-formoterol (SYMBICORT) 160-4.5 MCG/ACT inhaler Inhale 2 puffs into the lungs 2 (two) times daily. 04/29/11  Yes [name], MD  Cholecalciferol (VITAMIN D PO) Take by mouth daily.     Yes Historical Provider, MD  diltiazem (CARDIZEM LA) 180 MG 24 hr tablet Take 180 mg by mouth daily.     Yes Historical Provider, MD  fenofibrate 54 MG tablet Take 1 tablet by mouth Daily.   Yes Historical Provider, MD  FOLIC ACID PO Take by mouth daily.     Yes Historical Provider, MD  furosemide (LASIX) 80 MG tablet Take 80 mg by mouth daily.     Yes Historical Provider,  MD  Iron Polysacch Cmplx-B12-FA 150-0.025-1 MG CAPS Take by mouth.     Yes Historical Provider, MD  Multiple Vitamin (MULTIVITAMIN PO) Take by mouth daily.     Yes Historical Provider, MD  NITROSTAT 0.4 MG SL tablet as needed. Take as directed   Yes Historical Provider, MD  Omega-3 Fatty Acids (FISH OIL) 1000 MG CAPS Take by mouth. 2 tabs twice daily    Yes Historical Provider, MD  pantoprazole (PROTONIX) 40 MG tablet Take 40 mg by mouth daily.     Yes Historical Provider, MD  potassium chloride (KLOR-CON 10) 10 MEQ CR tablet Take 10 mEq by mouth  daily.     Yes Historical Provider, MD  SELENIUM PO Take by mouth daily.     Yes Historical Provider, MD  sotalol (BETAPACE) 120 MG tablet Take 120 mg by mouth daily.     Yes Historical Provider, MD  SPIRIVA HANDIHALER 18 MCG inhalation capsule INHALE CONTENTS OF 1 CAPSULE BY MOUTH DAILY 12/21/10  Yes [name], MD  warfarin (COUMADIN) 2.5 MG tablet Take as directed    Yes Historical Provider, MD    Allergies:  Allergies  Allergen Reactions  . Testosterone Swelling    In legs    History   Social History  . Marital Status: Married    Spouse Name: N/A    Number of Children: N/A  . Years of Education: N/A   Occupational History  . retired     [employer]   Social History Main Topics  . Smoking status: Former Smoker -- 2.0 packs/day for 55 years    Types: Cigarettes    Quit date: 07/21/1998  . Smokeless tobacco: Never Used  . Alcohol Use: Not on file  . Drug Use: Not on file  . Sexually Active: Not on file   Other Topics Concern  . Not on file   Social History Narrative  . No narrative on file     Family History  Problem Relation Age of Onset  . Tuberculosis Father     arrested  . Cancer Father   . Aortic stenosis Mother     ASCVD     ROS:  Please see the history of present illness.    In a in great questions that you asked  All other systems reviewed and negative.    Physical Exam:  Blood pressure 106/60, pulse 70, height 5\' 6"  (1.676 m), weight 204 lb (92.534 kg), SpO2 97.00%. General: Well developed,  morbidly obese Caucasian   male appearing his stated age in no acute distress. Head: Normocephalic, atraumatic, sclera non-icteric, no xanthomas, nares are without discharge. Lymph Nodes:  none Neck: Negative for carotid bruits. JVD not elevated. Lungs: Clear bilaterally to auscultation without wheezes, rales, or rhonchi. Breathing is unlabored. Heart: RRR with S1 S2. 2/6  Murmur at the upper sternal border rubs, or gallops appreciated. Abdomen:  Soft, non-tender, non-distended with normoactive bowel sounds. No hepatomegaly. No rebound/guarding. No obvious abdominal masses. Msk:  Strength and tone appear normal for age. Extremities: No clubbing or cyanosis. No edema.  Distal pedal pulses are 2+ and equal bilaterally. Skin: Warm and Dry Neuro: Alert and oriented X 3. CN III-XII intact Grossly normal sensory and motor function . Psych:  Responds to questions appropriately with a normal affect.      CBC Lab Results  Component Value Date   WBC 7.0 11/18/2011   HGB 12.6* 11/18/2011   HCT 38.6* 11/18/2011   MCV 94.7 11/18/2011  PLT 295.0 11/18/2011       Assessment and Plan:     Sherryl Manges

## 2011-12-25 NOTE — Assessment & Plan Note (Signed)
As above.

## 2011-12-30 ENCOUNTER — Encounter (HOSPITAL_COMMUNITY)
Admission: RE | Admit: 2011-12-30 | Discharge: 2011-12-30 | Disposition: A | Discharge status: Home / Self Care | Payer: Self-pay | Source: Ambulatory Visit | Attending: Internal Medicine | Admitting: Internal Medicine

## 2012-01-01 ENCOUNTER — Encounter (HOSPITAL_COMMUNITY)
Admission: RE | Admit: 2012-01-01 | Discharge: 2012-01-01 | Disposition: A | Discharge status: Home / Self Care | Payer: Self-pay | Source: Ambulatory Visit | Attending: Internal Medicine | Admitting: Internal Medicine

## 2012-01-06 ENCOUNTER — Encounter (HOSPITAL_COMMUNITY)
Admission: RE | Admit: 2012-01-06 | Discharge: 2012-01-06 | Disposition: A | Discharge status: Home / Self Care | Payer: Self-pay | Source: Ambulatory Visit | Attending: Internal Medicine | Admitting: Internal Medicine

## 2012-01-08 ENCOUNTER — Encounter (HOSPITAL_COMMUNITY)
Admission: RE | Admit: 2012-01-08 | Discharge: 2012-01-08 | Disposition: A | Discharge status: Home / Self Care | Payer: Self-pay | Source: Ambulatory Visit | Attending: Internal Medicine | Admitting: Internal Medicine

## 2012-01-13 ENCOUNTER — Encounter (HOSPITAL_COMMUNITY)
Admission: RE | Admit: 2012-01-13 | Discharge: 2012-01-13 | Disposition: A | Discharge status: Home / Self Care | Payer: Self-pay | Source: Ambulatory Visit | Attending: Internal Medicine | Admitting: Internal Medicine

## 2012-01-15 ENCOUNTER — Other Ambulatory Visit: Payer: Self-pay | Admitting: Internal Medicine

## 2012-01-15 ENCOUNTER — Encounter (HOSPITAL_COMMUNITY)
Admission: RE | Admit: 2012-01-15 | Discharge: 2012-01-15 | Disposition: A | Discharge status: Home / Self Care | Payer: Self-pay | Source: Ambulatory Visit | Attending: Internal Medicine | Admitting: Internal Medicine

## 2012-01-20 ENCOUNTER — Encounter (HOSPITAL_COMMUNITY)
Admission: RE | Admit: 2012-01-20 | Discharge: 2012-01-20 | Disposition: A | Discharge status: Home / Self Care | Payer: Self-pay | Source: Ambulatory Visit | Attending: Internal Medicine | Admitting: Internal Medicine

## 2012-01-20 DIAGNOSIS — J4489 Other specified chronic obstructive pulmonary disease: Secondary | ICD-10-CM | POA: Insufficient documentation

## 2012-01-20 DIAGNOSIS — I4891 Unspecified atrial fibrillation: Secondary | ICD-10-CM | POA: Insufficient documentation

## 2012-01-20 DIAGNOSIS — Z5189 Encounter for other specified aftercare: Secondary | ICD-10-CM | POA: Insufficient documentation

## 2012-01-20 DIAGNOSIS — R0989 Other specified symptoms and signs involving the circulatory and respiratory systems: Secondary | ICD-10-CM | POA: Insufficient documentation

## 2012-01-20 DIAGNOSIS — I2510 Atherosclerotic heart disease of native coronary artery without angina pectoris: Secondary | ICD-10-CM | POA: Insufficient documentation

## 2012-01-20 DIAGNOSIS — R0609 Other forms of dyspnea: Secondary | ICD-10-CM | POA: Insufficient documentation

## 2012-01-20 DIAGNOSIS — J449 Chronic obstructive pulmonary disease, unspecified: Secondary | ICD-10-CM | POA: Insufficient documentation

## 2012-01-20 DIAGNOSIS — G4730 Sleep apnea, unspecified: Secondary | ICD-10-CM | POA: Insufficient documentation

## 2012-01-20 DIAGNOSIS — E785 Hyperlipidemia, unspecified: Secondary | ICD-10-CM | POA: Insufficient documentation

## 2012-01-20 DIAGNOSIS — E678 Other specified hyperalimentation: Secondary | ICD-10-CM | POA: Insufficient documentation

## 2012-01-22 ENCOUNTER — Encounter (HOSPITAL_COMMUNITY): Payer: Self-pay

## 2012-01-27 ENCOUNTER — Encounter (HOSPITAL_COMMUNITY)
Admission: RE | Admit: 2012-01-27 | Discharge: 2012-01-27 | Disposition: A | Discharge status: Home / Self Care | Payer: Self-pay | Source: Ambulatory Visit | Attending: Internal Medicine | Admitting: Internal Medicine

## 2012-01-29 ENCOUNTER — Encounter (HOSPITAL_COMMUNITY)
Admission: RE | Admit: 2012-01-29 | Discharge: 2012-01-29 | Disposition: A | Discharge status: Home / Self Care | Payer: Self-pay | Source: Ambulatory Visit | Attending: Internal Medicine | Admitting: Internal Medicine

## 2012-02-03 ENCOUNTER — Encounter (HOSPITAL_COMMUNITY)
Admission: RE | Admit: 2012-02-03 | Discharge: 2012-02-03 | Disposition: A | Discharge status: Home / Self Care | Payer: Self-pay | Source: Ambulatory Visit | Attending: Internal Medicine | Admitting: Internal Medicine

## 2012-02-05 ENCOUNTER — Encounter (HOSPITAL_COMMUNITY)
Admission: RE | Admit: 2012-02-05 | Discharge: 2012-02-05 | Disposition: A | Discharge status: Home / Self Care | Payer: Self-pay | Source: Ambulatory Visit | Attending: Internal Medicine | Admitting: Internal Medicine

## 2012-02-10 ENCOUNTER — Encounter (HOSPITAL_COMMUNITY)
Admission: RE | Admit: 2012-02-10 | Discharge: 2012-02-10 | Disposition: A | Discharge status: Home / Self Care | Payer: Self-pay | Source: Ambulatory Visit | Attending: Internal Medicine | Admitting: Internal Medicine

## 2012-02-12 ENCOUNTER — Encounter (HOSPITAL_COMMUNITY)
Admission: RE | Admit: 2012-02-12 | Discharge: 2012-02-12 | Disposition: A | Discharge status: Home / Self Care | Payer: Self-pay | Source: Ambulatory Visit | Attending: Internal Medicine | Admitting: Internal Medicine

## 2012-02-17 ENCOUNTER — Encounter (HOSPITAL_COMMUNITY)
Admission: RE | Admit: 2012-02-17 | Discharge: 2012-02-17 | Disposition: A | Discharge status: Home / Self Care | Payer: Self-pay | Source: Ambulatory Visit | Attending: Internal Medicine | Admitting: Internal Medicine

## 2012-02-19 ENCOUNTER — Encounter (HOSPITAL_COMMUNITY)
Admission: RE | Admit: 2012-02-19 | Discharge: 2012-02-19 | Disposition: A | Discharge status: Home / Self Care | Payer: Self-pay | Source: Ambulatory Visit | Attending: Internal Medicine | Admitting: Internal Medicine

## 2012-02-19 DIAGNOSIS — E785 Hyperlipidemia, unspecified: Secondary | ICD-10-CM | POA: Insufficient documentation

## 2012-02-19 DIAGNOSIS — J4489 Other specified chronic obstructive pulmonary disease: Secondary | ICD-10-CM | POA: Insufficient documentation

## 2012-02-19 DIAGNOSIS — R0989 Other specified symptoms and signs involving the circulatory and respiratory systems: Secondary | ICD-10-CM | POA: Insufficient documentation

## 2012-02-19 DIAGNOSIS — J449 Chronic obstructive pulmonary disease, unspecified: Secondary | ICD-10-CM | POA: Insufficient documentation

## 2012-02-19 DIAGNOSIS — E678 Other specified hyperalimentation: Secondary | ICD-10-CM | POA: Insufficient documentation

## 2012-02-19 DIAGNOSIS — G4730 Sleep apnea, unspecified: Secondary | ICD-10-CM | POA: Insufficient documentation

## 2012-02-19 DIAGNOSIS — Z5189 Encounter for other specified aftercare: Secondary | ICD-10-CM | POA: Insufficient documentation

## 2012-02-19 DIAGNOSIS — I4891 Unspecified atrial fibrillation: Secondary | ICD-10-CM | POA: Insufficient documentation

## 2012-02-19 DIAGNOSIS — I2510 Atherosclerotic heart disease of native coronary artery without angina pectoris: Secondary | ICD-10-CM | POA: Insufficient documentation

## 2012-02-19 DIAGNOSIS — R0609 Other forms of dyspnea: Secondary | ICD-10-CM | POA: Insufficient documentation

## 2012-02-24 ENCOUNTER — Encounter (HOSPITAL_COMMUNITY)
Admission: RE | Admit: 2012-02-24 | Discharge: 2012-02-24 | Disposition: A | Discharge status: Home / Self Care | Payer: Self-pay | Source: Ambulatory Visit | Attending: Internal Medicine | Admitting: Internal Medicine

## 2012-02-26 ENCOUNTER — Encounter (HOSPITAL_COMMUNITY)
Admission: RE | Admit: 2012-02-26 | Discharge: 2012-02-26 | Disposition: A | Discharge status: Home / Self Care | Payer: Self-pay | Source: Ambulatory Visit | Attending: Internal Medicine | Admitting: Internal Medicine

## 2012-03-02 ENCOUNTER — Encounter (HOSPITAL_COMMUNITY)
Admission: RE | Admit: 2012-03-02 | Discharge: 2012-03-02 | Disposition: A | Discharge status: Home / Self Care | Payer: Self-pay | Source: Ambulatory Visit | Attending: Internal Medicine | Admitting: Internal Medicine

## 2012-03-04 ENCOUNTER — Encounter (HOSPITAL_COMMUNITY)
Admission: RE | Admit: 2012-03-04 | Discharge: 2012-03-04 | Disposition: A | Discharge status: Home / Self Care | Payer: Self-pay | Source: Ambulatory Visit | Attending: Internal Medicine | Admitting: Internal Medicine

## 2012-03-09 ENCOUNTER — Encounter (HOSPITAL_COMMUNITY)
Admission: RE | Admit: 2012-03-09 | Discharge: 2012-03-09 | Disposition: A | Discharge status: Home / Self Care | Payer: Self-pay | Source: Ambulatory Visit | Attending: Internal Medicine | Admitting: Internal Medicine

## 2012-03-11 ENCOUNTER — Encounter (HOSPITAL_COMMUNITY)
Admission: RE | Admit: 2012-03-11 | Discharge: 2012-03-11 | Disposition: A | Discharge status: Home / Self Care | Payer: Self-pay | Source: Ambulatory Visit | Attending: Internal Medicine | Admitting: Internal Medicine

## 2012-03-16 ENCOUNTER — Encounter (HOSPITAL_COMMUNITY)
Admission: RE | Admit: 2012-03-16 | Discharge: 2012-03-16 | Disposition: A | Discharge status: Home / Self Care | Payer: Self-pay | Source: Ambulatory Visit | Attending: Internal Medicine | Admitting: Internal Medicine

## 2012-03-16 ENCOUNTER — Other Ambulatory Visit: Payer: Self-pay | Admitting: Internal Medicine

## 2012-03-18 ENCOUNTER — Encounter (HOSPITAL_COMMUNITY)
Admission: RE | Admit: 2012-03-18 | Discharge: 2012-03-18 | Disposition: A | Discharge status: Home / Self Care | Payer: Self-pay | Source: Ambulatory Visit | Attending: Internal Medicine | Admitting: Internal Medicine

## 2012-03-23 ENCOUNTER — Encounter (HOSPITAL_COMMUNITY)
Admission: RE | Admit: 2012-03-23 | Discharge: 2012-03-23 | Disposition: A | Discharge status: Home / Self Care | Payer: Self-pay | Source: Ambulatory Visit | Attending: Internal Medicine | Admitting: Internal Medicine

## 2012-03-23 DIAGNOSIS — R0609 Other forms of dyspnea: Secondary | ICD-10-CM | POA: Insufficient documentation

## 2012-03-23 DIAGNOSIS — G4730 Sleep apnea, unspecified: Secondary | ICD-10-CM | POA: Insufficient documentation

## 2012-03-23 DIAGNOSIS — E785 Hyperlipidemia, unspecified: Secondary | ICD-10-CM | POA: Insufficient documentation

## 2012-03-23 DIAGNOSIS — I4891 Unspecified atrial fibrillation: Secondary | ICD-10-CM | POA: Insufficient documentation

## 2012-03-23 DIAGNOSIS — E678 Other specified hyperalimentation: Secondary | ICD-10-CM | POA: Insufficient documentation

## 2012-03-23 DIAGNOSIS — I2510 Atherosclerotic heart disease of native coronary artery without angina pectoris: Secondary | ICD-10-CM | POA: Insufficient documentation

## 2012-03-23 DIAGNOSIS — J449 Chronic obstructive pulmonary disease, unspecified: Secondary | ICD-10-CM | POA: Insufficient documentation

## 2012-03-23 DIAGNOSIS — Z5189 Encounter for other specified aftercare: Secondary | ICD-10-CM | POA: Insufficient documentation

## 2012-03-23 DIAGNOSIS — R0989 Other specified symptoms and signs involving the circulatory and respiratory systems: Secondary | ICD-10-CM | POA: Insufficient documentation

## 2012-03-23 DIAGNOSIS — J4489 Other specified chronic obstructive pulmonary disease: Secondary | ICD-10-CM | POA: Insufficient documentation

## 2012-03-25 ENCOUNTER — Encounter (HOSPITAL_COMMUNITY)
Admission: RE | Admit: 2012-03-25 | Discharge: 2012-03-25 | Disposition: A | Discharge status: Home / Self Care | Payer: Self-pay | Source: Ambulatory Visit | Attending: Internal Medicine | Admitting: Internal Medicine

## 2012-03-30 ENCOUNTER — Encounter (HOSPITAL_COMMUNITY)
Admission: RE | Admit: 2012-03-30 | Discharge: 2012-03-30 | Disposition: A | Discharge status: Home / Self Care | Payer: Self-pay | Source: Ambulatory Visit | Attending: Internal Medicine | Admitting: Internal Medicine

## 2012-04-01 ENCOUNTER — Encounter (HOSPITAL_COMMUNITY)
Admission: RE | Admit: 2012-04-01 | Discharge: 2012-04-01 | Disposition: A | Discharge status: Home / Self Care | Payer: Self-pay | Source: Ambulatory Visit | Attending: Internal Medicine | Admitting: Internal Medicine

## 2012-04-06 ENCOUNTER — Encounter (HOSPITAL_COMMUNITY)
Admission: RE | Admit: 2012-04-06 | Discharge: 2012-04-06 | Disposition: A | Discharge status: Home / Self Care | Payer: Self-pay | Source: Ambulatory Visit | Attending: Internal Medicine | Admitting: Internal Medicine

## 2012-04-08 ENCOUNTER — Encounter (HOSPITAL_COMMUNITY)
Admission: RE | Admit: 2012-04-08 | Discharge: 2012-04-08 | Disposition: A | Discharge status: Home / Self Care | Payer: Self-pay | Source: Ambulatory Visit | Attending: Internal Medicine | Admitting: Internal Medicine

## 2012-04-13 ENCOUNTER — Encounter (HOSPITAL_COMMUNITY)
Admission: RE | Admit: 2012-04-13 | Discharge: 2012-04-13 | Disposition: A | Discharge status: Home / Self Care | Payer: Self-pay | Source: Ambulatory Visit | Attending: Internal Medicine | Admitting: Internal Medicine

## 2012-04-15 ENCOUNTER — Encounter (HOSPITAL_COMMUNITY)
Admission: RE | Admit: 2012-04-15 | Discharge: 2012-04-15 | Disposition: A | Discharge status: Home / Self Care | Payer: Self-pay | Source: Ambulatory Visit | Attending: Internal Medicine | Admitting: Internal Medicine

## 2012-04-20 ENCOUNTER — Encounter (HOSPITAL_COMMUNITY): Payer: Self-pay

## 2012-04-20 DIAGNOSIS — E785 Hyperlipidemia, unspecified: Secondary | ICD-10-CM | POA: Insufficient documentation

## 2012-04-20 DIAGNOSIS — J449 Chronic obstructive pulmonary disease, unspecified: Secondary | ICD-10-CM | POA: Insufficient documentation

## 2012-04-20 DIAGNOSIS — Z5189 Encounter for other specified aftercare: Secondary | ICD-10-CM | POA: Insufficient documentation

## 2012-04-20 DIAGNOSIS — G4730 Sleep apnea, unspecified: Secondary | ICD-10-CM | POA: Insufficient documentation

## 2012-04-20 DIAGNOSIS — I2510 Atherosclerotic heart disease of native coronary artery without angina pectoris: Secondary | ICD-10-CM | POA: Insufficient documentation

## 2012-04-20 DIAGNOSIS — R0989 Other specified symptoms and signs involving the circulatory and respiratory systems: Secondary | ICD-10-CM | POA: Insufficient documentation

## 2012-04-20 DIAGNOSIS — E678 Other specified hyperalimentation: Secondary | ICD-10-CM | POA: Insufficient documentation

## 2012-04-20 DIAGNOSIS — R0609 Other forms of dyspnea: Secondary | ICD-10-CM | POA: Insufficient documentation

## 2012-04-20 DIAGNOSIS — I4891 Unspecified atrial fibrillation: Secondary | ICD-10-CM | POA: Insufficient documentation

## 2012-04-20 DIAGNOSIS — J4489 Other specified chronic obstructive pulmonary disease: Secondary | ICD-10-CM | POA: Insufficient documentation

## 2012-04-22 ENCOUNTER — Encounter (HOSPITAL_COMMUNITY): Payer: Self-pay

## 2012-04-27 ENCOUNTER — Encounter (HOSPITAL_COMMUNITY)
Admission: RE | Admit: 2012-04-27 | Discharge: 2012-04-27 | Disposition: A | Discharge status: Home / Self Care | Payer: Self-pay | Source: Ambulatory Visit | Attending: Internal Medicine | Admitting: Internal Medicine

## 2012-04-29 ENCOUNTER — Encounter (HOSPITAL_COMMUNITY): Payer: Self-pay

## 2012-05-04 ENCOUNTER — Encounter (HOSPITAL_COMMUNITY): Payer: Medicare Other

## 2012-05-06 ENCOUNTER — Encounter (HOSPITAL_COMMUNITY): Payer: Medicare Other

## 2012-05-11 ENCOUNTER — Encounter (HOSPITAL_COMMUNITY): Payer: Self-pay

## 2012-05-13 ENCOUNTER — Encounter (HOSPITAL_COMMUNITY): Payer: Self-pay

## 2012-05-18 ENCOUNTER — Encounter (HOSPITAL_COMMUNITY): Payer: Self-pay

## 2012-05-20 ENCOUNTER — Encounter (HOSPITAL_COMMUNITY): Payer: Self-pay

## 2012-05-21 ENCOUNTER — Ambulatory Visit (INDEPENDENT_AMBULATORY_CARE_PROVIDER_SITE_OTHER): Payer: Medicare Other | Admitting: Internal Medicine

## 2012-05-21 ENCOUNTER — Encounter: Payer: Self-pay | Admitting: Internal Medicine

## 2012-05-21 VITALS — BP 116/72 | HR 75 | Ht 66.0 in | Wt 199.2 lb

## 2012-05-21 DIAGNOSIS — G4733 Obstructive sleep apnea (adult) (pediatric): Secondary | ICD-10-CM

## 2012-05-21 DIAGNOSIS — E678 Other specified hyperalimentation: Secondary | ICD-10-CM

## 2012-05-21 DIAGNOSIS — J449 Chronic obstructive pulmonary disease, unspecified: Secondary | ICD-10-CM

## 2012-05-21 NOTE — Progress Notes (Signed)
[name] Lever, MD 11/18/2011 9:09 PM Pended  Patient ID: [name] Gill, male DOB: [birth year], 79 y.o. MRN: [id number]  HPI  43 yoM followed for OSA and COPD complicated by CAD and AFib/ pacemaker. with obesity hypoventilation syndrome. Last here May 15, 2010. Since then he finished Pulmonary Rehab and says it was life changing. Pacing himself he can now walk around the block with a few rest stops. We talked about ways to maintain the momentum. Otherwise quiet winter- had one respiratory infection- not bad. Heart ok- no new problems or changes by Dr Radford Pax. FEV1 was 1.35/ 58% in 2011. Dr Felipa Eth did CXR during the winter.  He continues BIPAP 11/9 with O2 2 L/M, used at least 5-6 hours every night.  Denies routine cough, phlegm, chest pain, palpitation, bloody or purulent sputum.   05/16/11- 36 yoM followed for OSA and COPD complicated by CAD and AFib/ pacemaker, obesity hypoventilation syndrome.  Wife is here. Had flu shot and shingles vaccine. He feels he is doing very well. Pulmonary rehabilitation was a big help and he has lost 28 pounds since April. He is now in the maintenance program. Comfortable off of oxygen in the daytime. Using Spiriva and Symbicort. Never aware of wheeze. Denies cough or chest pain. He has an old nebulizer machine and rescue inhaler that he never needs. He doesn't notice that he wheezes.  He continues BiPAP 11/9 with oxygen at 2 L, every night for at least 5 or 6 hours. His wife comments that he now stays asleep on long drives and she thinks he is doing very well.   11/14/11- 52 yoM followed for OSA and COPD complicated by CAD and AFib/ pacemaker, obesity hypoventilation syndrome.  He continues to use BiPAP with inspiratory pressure 11/expiratory pressure 9 and says he is doing well with this.  He considers his COPD status "wonderful". Pulmonary rehabilitation as a big help and has made a significant difference in his strength over the past year. Has lost some weight.  Little cough or wheeze. Sleeps with oxygen 2 L. Using Symbicort twice daily and Spiriva. Occasional epistaxis and nasal stuffiness attributed to old nasal trauma.  11/18/11- 46 yoM followed for OSA and COPD complicated by CAD and AFib/ pacemaker, obesity hypoventilation syndrome.  PCP Dr Felipa Eth Just here 4 days ago. Wife brings him in today because he didn't tell me at last visit about recent problems she wanted me to know about. Oxygen saturation had been running 96-98% but has been lower in the past few days. Malaise. Had a gastroenteritis syndrome February 27 and similar March 21. In the last few days has felt tired, chills, feet swelling, stomach bloating. More short of breath off and on. Denies pain, fever, purulent discharge, leg pain or cramps. Was taking AndroGel but stopped last week. Has been eating a lot of licorice, up to 20 sticks/ day over the past month, initially to treat constipation. Then he just kept on because he liked it. Has history of mild renal insufficiency. Has been eating a banana a day and potassium supplement. He reports that the pulmonary rehabilitation nurse thought he might be fluid overloaded. In the last 2 days he had been eating boullion, accepting salt input in order to lose weight by avoiding sandwiches.  05/21/12- 10 yoM followed for OSA and COPD complicated by CAD and AFib/ pacemaker, obesity hypoventilation syndrome.  PCP Dr Felipa Eth   had flu vaccine Feels no longer needs O2-been about 4-5 months since used last; is having severe dry mouth(caused  tooth problems); would like to have another mask (not full face mask-[facility]). Had lost weight and since then has not used oxygen at all. Treated for atrial fibrillation with pacemaker. Doing very well and pulmonary rehabilitation program. Does not require oxygen during that exercise. Continues BiPAP 11/9/[facility] with no humidifier. I discussed the humidifier given his concern about dry mouth. COPD assessment test (CAT)  3/40  Review of Systems-See HPI Constitutional:   +Deliberate weight loss,   No-night sweats, fevers, chills, fatigue, lassitude. HEENT:   No-  headaches, difficulty swallowing, tooth/dental problems, sore throat,       No-  sneezing, itching, ear ache, nasal congestion, post nasal drip,  CV:  No-   chest pain, orthopnea, PND, +swelling in lower extremities,  No-anasarca, dizziness, palpitations Resp: +  shortness of breath with exertion or at rest.              No-   productive cough,  No non-productive cough,  No- coughing up of blood.              No-   change in color of mucus.  No- wheezing.   Skin: No-   rash or lesions. GI:  No-   heartburn, indigestion, abdominal pain, nausea, vomiting,   GU:  MS:  No-   joint pain or swelling.   Neuro-     nothing unusual Psych:  No- change in mood or affect. No depression or anxiety.  No memory loss.      Objective:   Physical Exam General- Alert, Oriented, Affect-appropriate, Distress- none acute. He has lost some weight (wife is also very heavy). Skin- rash-none, lesions- none, excoriation- none Lymphadenopathy- none Head- atraumatic            Eyes- Gross vision intact, PERRLA, conjunctivae clear secretions            Ears- Hearing, canals-normal            Nose- Clear, no-Septal dev, mucus, polyps, erosion, perforation             Throat- Mallampati II , mucosa clear , drainage- none, tonsils- atrophic Neck- flexible , trachea midline, no stridor , thyroid nl, carotid no bruit Chest - symmetrical excursion , unlabored           Heart/CV- IRR , no murmur , no gallop  , no rub, nl s1 s2                           - JVD +1cm , edema- 1+, stasis changes- none, varices- none           Lung- there is bilateral inspiratory wheeze, unlabored,   cough- none , dullness-none, rub- none           Chest wall-  Abd- t Br/ Gen/ Rectal- Not done, not indicated Extrem- cyanosis- none, clubbing, none, atrophy- none, strength- nl Neuro- grossly intact  to observation

## 2012-05-21 NOTE — Patient Instructions (Addendum)
Order- DME [facility] 1) replacement CPAP mask of choice and supplies    Dx OSA                                    2) add humidifier to BiPAP machine                                    3) D/C home oxygen  Please call as needed

## 2012-05-25 ENCOUNTER — Encounter (HOSPITAL_COMMUNITY)
Admission: RE | Admit: 2012-05-25 | Discharge: 2012-05-25 | Disposition: A | Discharge status: Home / Self Care | Payer: Self-pay | Source: Ambulatory Visit | Attending: Internal Medicine | Admitting: Internal Medicine

## 2012-05-25 DIAGNOSIS — R0609 Other forms of dyspnea: Secondary | ICD-10-CM | POA: Insufficient documentation

## 2012-05-25 DIAGNOSIS — I2510 Atherosclerotic heart disease of native coronary artery without angina pectoris: Secondary | ICD-10-CM | POA: Insufficient documentation

## 2012-05-25 DIAGNOSIS — E678 Other specified hyperalimentation: Secondary | ICD-10-CM | POA: Insufficient documentation

## 2012-05-25 DIAGNOSIS — E785 Hyperlipidemia, unspecified: Secondary | ICD-10-CM | POA: Insufficient documentation

## 2012-05-25 DIAGNOSIS — Z5189 Encounter for other specified aftercare: Secondary | ICD-10-CM | POA: Insufficient documentation

## 2012-05-25 DIAGNOSIS — J449 Chronic obstructive pulmonary disease, unspecified: Secondary | ICD-10-CM | POA: Insufficient documentation

## 2012-05-25 DIAGNOSIS — J4489 Other specified chronic obstructive pulmonary disease: Secondary | ICD-10-CM | POA: Insufficient documentation

## 2012-05-25 DIAGNOSIS — I4891 Unspecified atrial fibrillation: Secondary | ICD-10-CM | POA: Insufficient documentation

## 2012-05-25 DIAGNOSIS — G4730 Sleep apnea, unspecified: Secondary | ICD-10-CM | POA: Insufficient documentation

## 2012-05-25 DIAGNOSIS — R0989 Other specified symptoms and signs involving the circulatory and respiratory systems: Secondary | ICD-10-CM | POA: Insufficient documentation

## 2012-05-27 ENCOUNTER — Encounter (HOSPITAL_COMMUNITY)
Admission: RE | Admit: 2012-05-27 | Discharge: 2012-05-27 | Disposition: A | Discharge status: Home / Self Care | Payer: Self-pay | Source: Ambulatory Visit | Attending: Internal Medicine | Admitting: Internal Medicine

## 2012-05-31 NOTE — Assessment & Plan Note (Signed)
Good compliance and control. No longer using supplemental oxygen we can DC his home oxygen. [facility] needs to add humidifier

## 2012-05-31 NOTE — Assessment & Plan Note (Signed)
Improved by weight loss 

## 2012-05-31 NOTE — Assessment & Plan Note (Signed)
Weight loss is made a big difference in his exercise tolerance I think pacemaker also helped. He is not coughing or wheezing to suggest airway problems now.

## 2012-06-01 ENCOUNTER — Encounter (HOSPITAL_COMMUNITY): Payer: Self-pay

## 2012-06-03 ENCOUNTER — Encounter (HOSPITAL_COMMUNITY)
Admission: RE | Admit: 2012-06-03 | Discharge: 2012-06-03 | Disposition: A | Discharge status: Home / Self Care | Payer: Self-pay | Source: Ambulatory Visit | Attending: Internal Medicine | Admitting: Internal Medicine

## 2012-06-08 ENCOUNTER — Encounter (HOSPITAL_COMMUNITY)
Admission: RE | Admit: 2012-06-08 | Discharge: 2012-06-08 | Disposition: A | Discharge status: Home / Self Care | Payer: Self-pay | Source: Ambulatory Visit | Attending: Internal Medicine | Admitting: Internal Medicine

## 2012-06-10 ENCOUNTER — Encounter (HOSPITAL_COMMUNITY)
Admission: RE | Admit: 2012-06-10 | Discharge: 2012-06-10 | Disposition: A | Discharge status: Home / Self Care | Payer: Self-pay | Source: Ambulatory Visit | Attending: Internal Medicine | Admitting: Internal Medicine

## 2012-06-15 ENCOUNTER — Encounter (HOSPITAL_COMMUNITY)
Admission: RE | Admit: 2012-06-15 | Discharge: 2012-06-15 | Disposition: A | Discharge status: Home / Self Care | Payer: Self-pay | Source: Ambulatory Visit | Attending: Internal Medicine | Admitting: Internal Medicine

## 2012-06-17 ENCOUNTER — Encounter (HOSPITAL_COMMUNITY): Payer: Self-pay

## 2012-06-22 ENCOUNTER — Encounter (HOSPITAL_COMMUNITY)
Admission: RE | Admit: 2012-06-22 | Discharge: 2012-06-22 | Disposition: A | Discharge status: Home / Self Care | Payer: Self-pay | Source: Ambulatory Visit | Attending: Internal Medicine | Admitting: Internal Medicine

## 2012-06-22 DIAGNOSIS — J449 Chronic obstructive pulmonary disease, unspecified: Secondary | ICD-10-CM | POA: Insufficient documentation

## 2012-06-22 DIAGNOSIS — I2510 Atherosclerotic heart disease of native coronary artery without angina pectoris: Secondary | ICD-10-CM | POA: Insufficient documentation

## 2012-06-22 DIAGNOSIS — I4891 Unspecified atrial fibrillation: Secondary | ICD-10-CM | POA: Insufficient documentation

## 2012-06-22 DIAGNOSIS — R0609 Other forms of dyspnea: Secondary | ICD-10-CM | POA: Insufficient documentation

## 2012-06-22 DIAGNOSIS — E678 Other specified hyperalimentation: Secondary | ICD-10-CM | POA: Insufficient documentation

## 2012-06-22 DIAGNOSIS — E785 Hyperlipidemia, unspecified: Secondary | ICD-10-CM | POA: Insufficient documentation

## 2012-06-22 DIAGNOSIS — R0989 Other specified symptoms and signs involving the circulatory and respiratory systems: Secondary | ICD-10-CM | POA: Insufficient documentation

## 2012-06-22 DIAGNOSIS — J4489 Other specified chronic obstructive pulmonary disease: Secondary | ICD-10-CM | POA: Insufficient documentation

## 2012-06-22 DIAGNOSIS — G4730 Sleep apnea, unspecified: Secondary | ICD-10-CM | POA: Insufficient documentation

## 2012-06-22 DIAGNOSIS — Z5189 Encounter for other specified aftercare: Secondary | ICD-10-CM | POA: Insufficient documentation

## 2012-06-24 ENCOUNTER — Encounter (HOSPITAL_COMMUNITY)
Admission: RE | Admit: 2012-06-24 | Discharge: 2012-06-24 | Disposition: A | Discharge status: Home / Self Care | Payer: Self-pay | Source: Ambulatory Visit | Attending: Internal Medicine | Admitting: Internal Medicine

## 2012-06-29 ENCOUNTER — Encounter (HOSPITAL_COMMUNITY)
Admission: RE | Admit: 2012-06-29 | Discharge: 2012-06-29 | Disposition: A | Discharge status: Home / Self Care | Payer: Self-pay | Source: Ambulatory Visit | Attending: Internal Medicine | Admitting: Internal Medicine

## 2012-07-01 ENCOUNTER — Encounter (HOSPITAL_COMMUNITY)
Admission: RE | Admit: 2012-07-01 | Discharge: 2012-07-01 | Disposition: A | Discharge status: Home / Self Care | Payer: Self-pay | Source: Ambulatory Visit | Attending: Internal Medicine | Admitting: Internal Medicine

## 2012-07-06 ENCOUNTER — Encounter (HOSPITAL_COMMUNITY)
Admission: RE | Admit: 2012-07-06 | Discharge: 2012-07-06 | Disposition: A | Discharge status: Home / Self Care | Payer: Self-pay | Source: Ambulatory Visit | Attending: Internal Medicine | Admitting: Internal Medicine

## 2012-07-08 ENCOUNTER — Encounter (HOSPITAL_COMMUNITY)
Admission: RE | Admit: 2012-07-08 | Discharge: 2012-07-08 | Disposition: A | Discharge status: Home / Self Care | Payer: Self-pay | Source: Ambulatory Visit | Attending: Internal Medicine | Admitting: Internal Medicine

## 2012-07-13 ENCOUNTER — Encounter (HOSPITAL_COMMUNITY)
Admission: RE | Admit: 2012-07-13 | Discharge: 2012-07-13 | Disposition: A | Discharge status: Home / Self Care | Payer: Self-pay | Source: Ambulatory Visit | Attending: Internal Medicine | Admitting: Internal Medicine

## 2012-07-15 ENCOUNTER — Encounter (HOSPITAL_COMMUNITY)
Admission: RE | Admit: 2012-07-15 | Discharge: 2012-07-15 | Disposition: A | Discharge status: Home / Self Care | Payer: Self-pay | Source: Ambulatory Visit | Attending: Internal Medicine | Admitting: Internal Medicine

## 2012-07-20 ENCOUNTER — Encounter (HOSPITAL_COMMUNITY)
Admission: RE | Admit: 2012-07-20 | Discharge: 2012-07-20 | Disposition: A | Discharge status: Home / Self Care | Payer: Self-pay | Source: Ambulatory Visit | Attending: Internal Medicine | Admitting: Internal Medicine

## 2012-07-20 NOTE — Progress Notes (Signed)
Today was [name] Gill last day in the pulmonary maintenance rehab program. Total with undergrad pulmonary rehab included, the patient was with [place] for 2 years!!!  He did an outstanding job and was able to maintain oxygen sats above 94% on RA most days. Patient did really well with increasing his workloads and resistance. Patient had began weight training in the maintenance program too and did fantastic. The patient stated he lost 65 pounds in the program!! He was one those patients all other patients looked up too. He was a great motivator and I believe he is going to do really well on his own at [employer].

## 2012-07-22 ENCOUNTER — Encounter (HOSPITAL_COMMUNITY): Payer: Medicare Other

## 2012-07-27 ENCOUNTER — Encounter (HOSPITAL_COMMUNITY): Payer: Medicare Other

## 2012-07-30 ENCOUNTER — Other Ambulatory Visit: Payer: Self-pay | Admitting: Internal Medicine

## 2012-07-30 MED ORDER — BUDESONIDE-FORMOTEROL FUMARATE 160-4.5 MCG/ACT IN AERO: 2.0000 | INHALATION_SPRAY | Freq: Two times a day (BID) | RESPIRATORY_TRACT | Status: DC

## 2012-11-24 ENCOUNTER — Ambulatory Visit (INDEPENDENT_AMBULATORY_CARE_PROVIDER_SITE_OTHER)
Admission: RE | Admit: 2012-11-24 | Discharge: 2012-11-24 | Disposition: A | Discharge status: Home / Self Care | Payer: Medicare Other | Source: Ambulatory Visit | Attending: Internal Medicine | Admitting: Internal Medicine

## 2012-11-24 ENCOUNTER — Ambulatory Visit (INDEPENDENT_AMBULATORY_CARE_PROVIDER_SITE_OTHER): Payer: Medicare Other | Admitting: Internal Medicine

## 2012-11-24 ENCOUNTER — Encounter: Payer: Self-pay | Admitting: Internal Medicine

## 2012-11-24 VITALS — BP 112/70 | HR 79 | Ht 66.0 in | Wt 197.8 lb

## 2012-11-24 DIAGNOSIS — J449 Chronic obstructive pulmonary disease, unspecified: Secondary | ICD-10-CM

## 2012-11-24 DIAGNOSIS — E678 Other specified hyperalimentation: Secondary | ICD-10-CM

## 2012-11-24 DIAGNOSIS — J438 Other emphysema: Secondary | ICD-10-CM

## 2012-11-24 DIAGNOSIS — J439 Emphysema, unspecified: Secondary | ICD-10-CM

## 2012-11-24 DIAGNOSIS — G4733 Obstructive sleep apnea (adult) (pediatric): Secondary | ICD-10-CM

## 2012-11-24 NOTE — Progress Notes (Signed)
[name] Lever, MD 11/18/2011 9:09 PM Pended  Patient ID: [name] Gill, male DOB: [birth year], 79 y.o. MRN: [id number]  HPI  79 yoM followed for OSA and COPD complicated by CAD and AFib/ pacemaker. with obesity hypoventilation syndrome. Last here May 15, 2010. Since then he finished Pulmonary Rehab and says it was life changing. Pacing himself he can now walk around the block with a few rest stops. We talked about ways to maintain the momentum. Otherwise quiet winter- had one respiratory infection- not bad. Heart ok- no new problems or changes by Dr Radford Pax. FEV1 was 1.35/ 58% in 2011. Dr Felipa Eth did CXR during the winter.  He continues BIPAP 11/9 with O2 2 L/M, used at least 5-6 hours every night.  Denies routine cough, phlegm, chest pain, palpitation, bloody or purulent sputum.   05/16/11- 79 yoM followed for OSA and COPD complicated by CAD and AFib/ pacemaker, obesity hypoventilation syndrome.  Wife is here. Had flu shot and shingles vaccine. He feels he is doing very well. Pulmonary rehabilitation was a big help and he has lost 28 pounds since April. He is now in the maintenance program. Comfortable off of oxygen in the daytime. Using Spiriva and Symbicort. Never aware of wheeze. Denies cough or chest pain. He has an old nebulizer machine and rescue inhaler that he never needs. He doesn't notice that he wheezes.  He continues BiPAP 11/9 with oxygen at 2 L, every night for at least 5 or 6 hours. His wife comments that he now stays asleep on long drives and she thinks he is doing very well.   11/14/11- 79 yoM followed for OSA and COPD complicated by CAD and AFib/ pacemaker, obesity hypoventilation syndrome.  He continues to use BiPAP with inspiratory pressure 11/expiratory pressure 9 and says he is doing well with this.  He considers his COPD status "wonderful". Pulmonary rehabilitation as a big help and has made a significant difference in his strength over the past year. Has lost some weight.  Little cough or wheeze. Sleeps with oxygen 2 L. Using Symbicort twice daily and Spiriva. Occasional epistaxis and nasal stuffiness attributed to old nasal trauma.  11/18/11- 79 yoM followed for OSA and COPD complicated by CAD and AFib/ pacemaker, obesity hypoventilation syndrome.  PCP Dr Felipa Eth Just here 4 days ago. Wife brings him in today because he didn't tell me at last visit about recent problems she wanted me to know about. Oxygen saturation had been running 96-98% but has been lower in the past few days. Malaise. Had a gastroenteritis syndrome February 27 and similar March 21. In the last few days has felt tired, chills, feet swelling, stomach bloating. More short of breath off and on. Denies pain, fever, purulent discharge, leg pain or cramps. Was taking AndroGel but stopped last week. Has been eating a lot of licorice, up to 20 sticks/ day over the past month, initially to treat constipation. Then he just kept on because he liked it. Has history of mild renal insufficiency. Has been eating a banana a day and potassium supplement. He reports that the pulmonary rehabilitation nurse thought he might be fluid overloaded. In the last 2 days he had been eating boullion, accepting salt input in order to lose weight by avoiding sandwiches.  05/21/12- 79 yoM followed for OSA and COPD complicated by CAD and AFib/ pacemaker, obesity hypoventilation syndrome.  PCP Dr Felipa Eth   had flu vaccine Feels no longer needs O2-been about 4-5 months since used last; is having severe dry mouth(caused  tooth problems); would like to have another mask (not full face mask-[facility]). Had lost weight and since then has not used oxygen at all. Treated for atrial fibrillation with pacemaker. Doing very well and pulmonary rehabilitation program. Does not require oxygen during that exercise. Continues BiPAP 11/9/[facility] with no humidifier. I discussed the humidifier given his concern about dry mouth. COPD assessment test (CAT)  3/40  11/24/12 79 yoM followed for OSA and COPD complicated by CAD and AFib/ pacemaker, obesity hypoventilation syndrome     PCP Dr Pete Glatter FOLLOWS FOR: patient reports he is feeling much better with his breathing esp after losing 60lbs-- c/o excessive dry mouth--wearing BiPAP11/9  every night for approx 4 hrs per night- tolerating pressure ok He had lost weight, about 25 pounds by our scale while working in pulmonary rehabilitation. He admits he is gained some back. He is now going to [employer]. Denies cough wheeze, edema or chest pain. Using a nasal decongestant spray about twice daily and we discussed this, recommending he cut way down. CXR 11/19/11 IMPRESSION:  Mild basilar predominant pulmonary edema and small bilateral  effusions.  Original Report Authenticated By: Brandon Melnick, M.D.  Review of Systems-See HPI Constitutional:   +Deliberate weight loss,   No-night sweats, fevers, chills, fatigue, lassitude. HEENT:   No-  headaches, difficulty swallowing, tooth/dental problems, sore throat,       No-  sneezing, itching, ear ache, +nasal congestion, post nasal drip,  CV:  No-   chest pain, orthopnea, PND, No-swelling in lower extremities,  No-anasarca, dizziness, palpitations Resp: +  shortness of breath with exertion or at rest.              No-   productive cough,  No non-productive cough,  No- coughing up of blood.              No-   change in color of mucus.  No- wheezing.   Skin: No-   rash or lesions. GI:  No-   heartburn, indigestion, abdominal pain, nausea, vomiting,   GU:  MS:  No-   joint pain or swelling.   Neuro-     nothing unusual Psych:  No- change in mood or affect. No depression or anxiety.  No memory loss.  Objective:   Physical Exam General- Alert, Oriented, Affect-appropriate, Distress- none acute. He has lost some weight but is still overweight (wife is also very heavy). Skin- rash-none, lesions- none, excoriation- none Lymphadenopathy- none Head-  atraumatic            Eyes- Gross vision intact, PERRLA, conjunctivae clear secretions            Ears- Hearing, canals-normal            Nose- Clear, no-Septal dev, mucus, polyps, erosion, perforation             Throat- Mallampati II , mucosa clear , drainage- none, tonsils- atrophic.+ Mild throat                   clearing Neck- flexible , trachea midline, no stridor , thyroid nl, carotid no bruit Chest - symmetrical excursion , unlabored           Heart/CV- IRR , no murmur , no gallop  , no rub, nl s1 s2                           - JVD +1cm , edema- 1+, stasis changes- none,  varices- none           Lung- clear,  cough- none , dullness-none, rub- none           Chest wall-  Abd-  Br/ Gen/ Rectal- Not done, not indicated Extrem- cyanosis- none, clubbing, none, atrophy- none, strength- nl Neuro- grossly intact to observation

## 2012-11-24 NOTE — Patient Instructions (Addendum)
Order- CXR dx COPD  Consider trying Biotene otc for dry mouth. There are also other brands- you could ask the druggist about medicines like Biotene  We can continue BiPAP 11/9 [facility]

## 2012-12-04 NOTE — Assessment & Plan Note (Signed)
I encouraged him to continue exercising. Hopefully he can keep his weight down. Plan-chest x-ray

## 2012-12-04 NOTE — Assessment & Plan Note (Signed)
This was improved when he lost weight

## 2012-12-04 NOTE — Assessment & Plan Note (Signed)
Good compliance and control. He remains comfortable with this pressure.

## 2012-12-08 NOTE — Progress Notes (Signed)
Quick Note:  Spoke with patients spouse, Informed her of results as listed below per Dr. Maple Hudson Verbalized understanding and nothing further needed at this time ______

## 2013-01-19 ENCOUNTER — Telehealth: Payer: Self-pay | Admitting: Internal Medicine

## 2013-01-19 MED ORDER — BUDESONIDE-FORMOTEROL FUMARATE 160-4.5 MCG/ACT IN AERO: 2.0000 | INHALATION_SPRAY | Freq: Two times a day (BID) | RESPIRATORY_TRACT | Status: DC

## 2013-01-19 NOTE — Telephone Encounter (Signed)
Rx has been sent to [facility].

## 2013-02-23 ENCOUNTER — Other Ambulatory Visit: Payer: Self-pay | Admitting: Internal Medicine

## 2013-03-27 ENCOUNTER — Other Ambulatory Visit: Payer: Self-pay | Admitting: Internal Medicine

## 2013-05-09 ENCOUNTER — Other Ambulatory Visit: Payer: Self-pay | Admitting: Cardiology

## 2013-05-21 HISTORY — PX: PACEMAKER INSERTION: SHX728

## 2013-05-25 ENCOUNTER — Ambulatory Visit (INDEPENDENT_AMBULATORY_CARE_PROVIDER_SITE_OTHER): Payer: Medicare Other | Admitting: *Deleted

## 2013-05-25 ENCOUNTER — Ambulatory Visit (INDEPENDENT_AMBULATORY_CARE_PROVIDER_SITE_OTHER): Payer: Medicare Other | Admitting: Pharmacist

## 2013-05-25 DIAGNOSIS — I4891 Unspecified atrial fibrillation: Secondary | ICD-10-CM

## 2013-05-25 LAB — PACEMAKER DEVICE OBSERVATION
AL AMPLITUDE: 1.54 mv
BATTERY VOLTAGE: 2.93 V
BRDY-0002RV: 60 {beats}/min
RV LEAD AMPLITUDE: 14.8 mv
RV LEAD IMPEDENCE PM: 440 Ohm
VENTRICULAR PACING PM: 49.52

## 2013-05-25 NOTE — Progress Notes (Signed)
Pacemaker check in clinic. Device reached ERI on 02-05-13. Normal device function. Pt in AF 100% of time. + Warfarin. Device reverted to VVI 65 but only changed lower rate to 60 due to 100% AF. Pt scheduled with Brooke on 05-27-13 @ 1200. Pt aware of appointment.

## 2013-05-27 ENCOUNTER — Encounter: Payer: Self-pay | Admitting: Internal Medicine

## 2013-05-27 ENCOUNTER — Encounter: Payer: Self-pay | Admitting: *Deleted

## 2013-05-27 ENCOUNTER — Ambulatory Visit (INDEPENDENT_AMBULATORY_CARE_PROVIDER_SITE_OTHER): Payer: Medicare Other | Admitting: Internal Medicine

## 2013-05-27 ENCOUNTER — Ambulatory Visit (INDEPENDENT_AMBULATORY_CARE_PROVIDER_SITE_OTHER): Payer: Medicare Other | Admitting: Cardiology

## 2013-05-27 ENCOUNTER — Encounter: Payer: Self-pay | Admitting: Cardiology

## 2013-05-27 VITALS — BP 142/88 | HR 75 | Ht 66.0 in | Wt 199.0 lb

## 2013-05-27 VITALS — BP 140/82 | HR 84 | Ht 66.0 in | Wt 197.8 lb

## 2013-05-27 DIAGNOSIS — R001 Bradycardia, unspecified: Secondary | ICD-10-CM

## 2013-05-27 DIAGNOSIS — Z45018 Encounter for adjustment and management of other part of cardiac pacemaker: Secondary | ICD-10-CM

## 2013-05-27 DIAGNOSIS — I498 Other specified cardiac arrhythmias: Secondary | ICD-10-CM

## 2013-05-27 DIAGNOSIS — I4891 Unspecified atrial fibrillation: Secondary | ICD-10-CM

## 2013-05-27 DIAGNOSIS — G4733 Obstructive sleep apnea (adult) (pediatric): Secondary | ICD-10-CM

## 2013-05-27 DIAGNOSIS — J439 Emphysema, unspecified: Secondary | ICD-10-CM

## 2013-05-27 DIAGNOSIS — Z45010 Encounter for checking and testing of cardiac pacemaker pulse generator [battery]: Secondary | ICD-10-CM

## 2013-05-27 DIAGNOSIS — J438 Other emphysema: Secondary | ICD-10-CM

## 2013-05-27 DIAGNOSIS — R599 Enlarged lymph nodes, unspecified: Secondary | ICD-10-CM

## 2013-05-27 DIAGNOSIS — Z950 Presence of cardiac pacemaker: Secondary | ICD-10-CM

## 2013-05-27 LAB — BASIC METABOLIC PANEL
BUN: 24 mg/dL — ABNORMAL HIGH (ref 6–23)
CO2: 30 mEq/L (ref 19–32)
GFR: 58.85 mL/min — ABNORMAL LOW (ref >60.00)
Glucose, Bld: 82 mg/dL (ref 70–99)
Potassium: 4.3 mEq/L (ref 3.5–5.1)
Sodium: 141 mEq/L (ref 135–145)

## 2013-05-27 LAB — CBC WITH DIFFERENTIAL/PLATELET
Basophils Absolute: 0.1 10*3/uL (ref 0.0–0.1)
Basophils Relative: 0.6 % (ref 0.0–3.0)
HCT: 37.3 % — ABNORMAL LOW (ref 39.0–52.0)
Hemoglobin: 12.4 g/dL — ABNORMAL LOW (ref 13.0–17.0)
Lymphocytes Relative: 15.4 % (ref 12.0–46.0)
Lymphs Abs: 1.3 10*3/uL (ref 0.7–4.0)
Monocytes Relative: 9.7 % (ref 3.0–12.0)
Neutro Abs: 6.2 10*3/uL (ref 1.4–7.7)
Neutrophils Relative %: 72.8 % (ref 43.0–77.0)
RDW: 16 % — ABNORMAL HIGH (ref 11.5–14.6)

## 2013-05-27 NOTE — Progress Notes (Signed)
ELECTROPHYSIOLOGY OFFICE NOTE  Patient ID: [name] Gill MRN: [id number], DOB/AGE: [birth year]   Date of Visit: 05/27/2013  Primary Physician: Ginette Otto, MD Primary Cardiologist / Primary EP: Armanda Magic, MD / Berton Mount, MD Reason for Visit: EP/device follow-up  History of Present Illness  [name] Gill is a 79 y.o. male with bradycardia s/p PPM implant, permanent AFib, CAD, HTN and COPD who presents today for electrophysiology followup. His pacemaker has been followed by Dr. Mayford Knife. His battery has reached ERI.  Today, he reports he is doing well and has no complaints. He denies chest pain or shortness of breath. He denies palpitations, dizziness, near syncope or syncope. He denies LE swelling, orthopnea, PND or recent weight gain. He is compliant with his medications.  Past Medical History Past Medical History  Diagnosis Date  . OSA (obstructive sleep apnea)     04-04-08 NPSG AHI 14.5, 3L/M suppl O2 for nadir 86%  . Atrial fibrillation     coumadin, pacemaker  . Diverticulitis 1975  . CHD (coronary heart disease)   . Hypertension   . COPD (chronic obstructive pulmonary disease)     FEV1 1.35/58% 01-11-10  . Hyperlipidemia   . Pacemaker -Medtronic     Medtronic  . Renal insufficiency   . Ventricular tachycardia, non-sustained     Detected on the device    Past Surgical History Past Surgical History  Procedure Laterality Date  . Broken nose    . Coronary artery bypass graft  1478,2956  . Pacemaker insertion  02/2008    medtronic    Allergies/Intolerances Allergies  Allergen Reactions  . Testosterone Swelling    In legs    Current Home Medications Current Outpatient Prescriptions  Medication Sig Dispense Refill  . albuterol (PROVENTIL HFA;VENTOLIN HFA) 108 (90 BASE) MCG/ACT inhaler Inhale 2 puffs into the lungs every 6 (six) hours as needed for wheezing or shortness of breath. Rescue  1 Inhaler  2  . Ascorbic Acid (VITAMIN C) 500 MG tablet Take 500 mg by  mouth daily.        [name] atorvastatin (LIPITOR) 80 MG tablet Take 1 tablet by mouth daily.      . beta carotene w/minerals (OCUVITE) tablet Take 1 tablet by mouth daily.        . Cholecalciferol (VITAMIN D PO) Take by mouth daily.        [name] diltiazem (CARDIZEM LA) 180 MG 24 hr tablet Take 180 mg by mouth daily.        . fenofibrate 54 MG tablet Take 1 tablet by mouth Daily.      [name] FOLIC ACID PO Take by mouth daily.        . furosemide (LASIX) 80 MG tablet TAKE 1 TABLET BY MOUTH DAILY  90 tablet  3  . Multiple Vitamin (MULTIVITAMIN PO) Take by mouth daily.        [name] NITROSTAT 0.4 MG SL tablet as needed. Take as directed      . NON FORMULARY QUALAQUIN 4-5 OUNCE A DAY      . Omega-3 Fatty Acids (FISH OIL) 1000 MG CAPS Take by mouth. 2 tabs twice daily       . pantoprazole (PROTONIX) 40 MG tablet Take 40 mg by mouth daily.        . potassium chloride (KLOR-CON 10) 10 MEQ CR tablet Take 10 mEq by mouth daily.        . SELENIUM PO Take by mouth daily.        [name]  SPIRIVA HANDIHALER 18 MCG inhalation capsule INHALE CONTENTS OF 1 CAPSULE BY MOUTH DAILY  90 capsule  2  . SYMBICORT 160-4.5 MCG/ACT inhaler INHALE 2 PUFFS INTO THE LUNGS TWICE DAILY  10.2 g  4  . warfarin (COUMADIN) 2.5 MG tablet Take as directed        No current facility-administered medications for this visit.    Social History History   Social History  . Marital Status: Married    Spouse Name: N/A    Number of Children: N/A  . Years of Education: N/A   Occupational History  . retired     [employer]   Social History Main Topics  . Smoking status: Former Smoker -- 2.00 packs/day for 55 years    Types: Cigarettes    Quit date: 07/21/1998  . Smokeless tobacco: Never Used  . Alcohol Use: Not on file  . Drug Use: Not on file  . Sexual Activity: Not on file   Other Topics Concern  . Not on file   Social History Narrative  . No narrative on file     Review of Systems General: No chills, fever, night sweats or  weight changes Cardiovascular: No chest pain, dyspnea on exertion, edema, orthopnea, palpitations, paroxysmal nocturnal dyspnea Dermatological: No rash, lesions or masses Respiratory: No cough, dyspnea Urologic: No hematuria, dysuria Abdominal: No nausea, vomiting, diarrhea, bright red blood per rectum, melena, or hematemesis Neurologic: No visual changes, weakness, changes in mental status All other systems reviewed and are otherwise negative except as noted above.  Physical Exam Vitals: Blood pressure 140/82, pulse 84, height 5\' 6"  (1.676 m), weight 197 lb 12.8 oz (89.721 kg).  General: Well developed, well appearing 79 y.o. male in no acute distress. HEENT: Normocephalic, atraumatic. EOMs intact. Sclera nonicteric. Oropharynx clear.  Neck: Supple. No JVD. Lungs: Respirations regular and unlabored, CTA bilaterally. No wheezes, rales or rhonchi. Heart: RRR. S1, S2 present. No murmurs, rub, S3 or S4. Abdomen: Soft, non-distended.   Extremities: No clubbing, cyanosis or edema. PT/Radials 2+ and equal bilaterally. Psych: Normal affect. Neuro: Alert and oriented X 3. Moves all extremities spontaneously.   Diagnostics Device interrogation - recent interrogation reviewed 05/25/2013 - battery at ERI since 02/05/2013; in AF 100% of time; sensing and thresholds within normal range; V paced 49.5%  Assessment and Plan 1. Bradycardia s/p PPM implant 2. Permanent AFib 3. CAD 4. HTN 5. COPD  [name] Gill presents for EP follow-up. His PPM battery has been at ERI >3 months. Discussed the need for PPM generator change with and his daughter. Risks, benefits and alternatives to PPM generator change were discussed in detail. These risks include, but are not limited to, bleeding and infection. [name] Gill expressed verbal understanding and wishes to proceed. Given that his PPM battery has been at [facility] >3 months we will schedule this as soon as possible.  Signed, Rick Duff, PA-C 05/27/2013, 2:07  PM

## 2013-05-27 NOTE — Progress Notes (Signed)
[name], MD 11/18/2011 9:09 PM Pended  Patient ID: [name] Gill, [name] Gill DOB: [birth year], 79 y.o. MRN: [id number]  HPI  43 yoM followed for OSA and COPD complicated by CAD and AFib/ pacemaker. with obesity hypoventilation syndrome. Last here May 15, 2010. Since then he finished Pulmonary Rehab and says it was life changing. Pacing himself he can now walk around the block with a few rest stops. We talked about ways to maintain the momentum. Otherwise quiet winter- had one respiratory infection- not bad. Heart ok- no new problems or changes by Dr Radford Pax. FEV1 was 1.35/ 58% in 2011. Dr Felipa Eth did CXR during the winter.  He continues BIPAP 11/9 with O2 2 L/M, used at least 5-6 hours every night.  Denies routine cough, phlegm, chest pain, palpitation, bloody or purulent sputum.   05/16/11- 36 yoM followed for OSA and COPD complicated by CAD and AFib/ pacemaker, obesity hypoventilation syndrome.  Wife is here. Had flu shot and shingles vaccine. He feels he is doing very well. Pulmonary rehabilitation was a big help and he has lost 28 pounds since April. He is now in the maintenance program. Comfortable off of oxygen in the daytime. Using Spiriva and Symbicort. Never aware of wheeze. Denies cough or chest pain. He has an old nebulizer machine and rescue inhaler that he never needs. He doesn't notice that he wheezes.  He continues BiPAP 11/9 with oxygen at 2 L, every night for at least 5 or 6 hours. His wife comments that he now stays asleep on long drives and she thinks he is doing very well.   11/14/11- 52 yoM followed for OSA and COPD complicated by CAD and AFib/ pacemaker, obesity hypoventilation syndrome.  He continues to use BiPAP with inspiratory pressure 11/expiratory pressure 9 and says he is doing well with this.  He considers his COPD status "wonderful". Pulmonary rehabilitation as a big help and has made a significant difference in his strength over the past year. Has lost some weight.  Little cough or wheeze. Sleeps with oxygen 2 L. Using Symbicort twice daily and Spiriva. Occasional epistaxis and nasal stuffiness attributed to old nasal trauma.  11/18/11- 46 yoM followed for OSA and COPD complicated by CAD and AFib/ pacemaker, obesity hypoventilation syndrome.  PCP Dr Felipa Eth Just here 4 days ago. Wife brings him in today because he didn't tell me at last visit about recent problems she wanted me to know about. Oxygen saturation had been running 96-98% but has been lower in the past few days. Malaise. Had a gastroenteritis syndrome February 27 and similar March 21. In the last few days has felt tired, chills, feet swelling, stomach bloating. More short of breath off and on. Denies pain, fever, purulent discharge, leg pain or cramps. Was taking AndroGel but stopped last week. Has been eating a lot of licorice, up to 20 sticks/ day over the past month, initially to treat constipation. Then he just kept on because he liked it. Has history of mild renal insufficiency. Has been eating a banana a day and potassium supplement. He reports that the pulmonary rehabilitation nurse thought he might be fluid overloaded. In the last 2 days he had been eating boullion, accepting salt input in order to lose weight by avoiding sandwiches.  05/21/12- 10 yoM followed for OSA and COPD complicated by CAD and AFib/ pacemaker, obesity hypoventilation syndrome.  PCP Dr Felipa Eth   had flu vaccine Feels no longer needs O2-been about 4-5 months since used last; is having severe dry mouth(caused  tooth problems); would like to have another mask (not full face mask-[facility]). Had lost weight and since then has not used oxygen at all. Treated for atrial fibrillation with pacemaker. Doing very well and pulmonary rehabilitation program. Does not require oxygen during that exercise. Continues BiPAP 11/9/[facility] with no humidifier. I discussed the humidifier given his concern about dry mouth. COPD assessment test (CAT)  3/40  11/24/12 86 yoM followed for OSA and COPD complicated by CAD and AFib/ pacemaker, obesity hypoventilation syndrome     PCP Dr Pete Glatter FOLLOWS FOR: patient reports he is feeling much better with his breathing esp after losing 60lbs-- c/o excessive dry mouth--wearing BiPAP11/9  every night for approx 4 hrs per night- tolerating pressure ok He had lost weight, about 25 pounds by our scale while working in pulmonary rehabilitation. He admits he is gained some back. He is now going to [employer]. Denies cough wheeze, edema or chest pain. Using a nasal decongestant spray about twice daily and we discussed this, recommending he cut way down. CXR 11/19/11 IMPRESSION:  Mild basilar predominant pulmonary edema and small bilateral  effusions.  Original Report Authenticated By: Brandon Melnick, M.D.  05/27/13- 62 yoM former smoker hefollowed for OSA and COPD complicated by CAD and AFib/ pacemaker, obesity hypoventilation syndrome     PCP Dr Pete Glatter FOLLOWS FOR:  Pt states he did have "cold" and was recently treated with abx. Wears BiPAP  11/9every night for about 4-5 hours; gets extreme dry mouth-has tried biotene since last visit and helps slightly. DME is [facility]. Dryness accentuated by furosemide and Spiriva. Still doing "Silver sneakers" As he was getting over a recent viral pattern cold syndrome he found a lymph node left neck -not tender CXR 5/ 7 / 14 IMPRESSION:  Mild chronic lung disease with bibasilar scarring. No acute  cardiopulmonary abnormality.  Original Report Authenticated By: Janeece Riggers, M.D.  Review of Systems-See HPI Constitutional:   +Deliberate weight loss,   No-night sweats, fevers, chills, fatigue, lassitude. HEENT:   No-  headaches, difficulty swallowing, tooth/dental problems, sore throat,       No-  sneezing, itching, ear ache, +nasal congestion, post nasal drip,  CV:  No-   chest pain, orthopnea, PND, No-swelling in lower extremities,  No-anasarca, dizziness,  palpitations Resp: +  shortness of breath with exertion or at rest.              No-   productive cough,  No non-productive cough,  No- coughing up of blood.              No-   change in color of mucus.  No- wheezing.   Skin: No-   rash or lesions. GI:  No-   heartburn, indigestion, abdominal pain, nausea, vomiting,   GU:  MS:  No-   joint pain or swelling.   Neuro-     nothing unusual Psych:  No- change in mood or affect. No depression or anxiety.  No memory loss.  Objective:   Physical Exam BP 142/88  Pulse 75  Ht 5\' 6"  (1.676 m)  Wt 199 lb (90.266 kg)  BMI 32.13 kg/m2  SpO2 98%  General- Alert, Oriented, Affect-appropriate, Distress- none acute. He has lost some weight but is still overweight (wife is also very heavy). Skin- rash-none, lesions- none, excoriation- none Lymphadenopathy+ 1 cm mobile node, nontender, at angle of jaw left Head- atraumatic            Eyes- Gross vision intact, PERRLA, conjunctivae clear  secretions            Ears- +Hearing aid            Nose- Clear, no-Septal dev, mucus, polyps, erosion, perforation             Throat- Mallampati III-IV , mucosa clear/ not very dry , drainage- none, tonsils- atrophic. Neck- flexible , trachea midline, no stridor , thyroid nl, carotid no bruit Chest - symmetrical excursion , unlabored           Heart/CV- IRR , no murmur , no gallop  , no rub, nl s1 s2                           - JVD +1cm , edema- none, stasis changes- none, varices- none           Lung- +basilar crackles,  cough- none , dullness-none, rub- none           Chest wall- +L pacemaker Abd-  Br/ Gen/ Rectal- Not done, not indicated Extrem- cyanosis- none, clubbing, none, atrophy- none, strength- nl Neuro- grossly intact to observation

## 2013-05-27 NOTE — Patient Instructions (Addendum)
Your physician has recommended that you have a pacemaker inserted June 01, 2013 AT 3:30 PM WITH DR. Ladona Ridgel. ARRIVE AT 1:30 PM AT [facility]. A pacemaker is a small device that is placed under the skin of your chest or abdomen to help control abnormal heart rhythms. This device uses electrical pulses to prompt the heart to beat at a normal rate. Pacemakers are used to treat heart rhythms that are too slow. Wire (leads) are attached to the pacemaker that goes into the chambers of you heart. This is done in the hospital and usually requires and overnight stay. Please see the instruction sheet given to you today for more information.  Your physician recommends that you return for lab work in: CBC,BMET, PT-INR  Your physician has recommended you make the following change in your medication:   HOLD THESE MEDICATIONS THE DAY OF PROCEDURE: 1) LASIX  Your physician recommends that you continue on your current medications as directed. Please refer to the Current Medication list given to you today.

## 2013-05-27 NOTE — Patient Instructions (Signed)
Ask your pharmacist if they have other products besides Biotene for dry mouth.  Let your doctor know if the enlarged lymph node at the angle of your jaw on the left doesn't go away when you are several weeks past the cold.   We can continue BIPAP 11/9   [facility]

## 2013-05-30 ENCOUNTER — Encounter (HOSPITAL_COMMUNITY): Payer: Self-pay | Admitting: Pharmacy Technician

## 2013-05-31 ENCOUNTER — Telehealth: Payer: Self-pay | Admitting: Cardiology

## 2013-05-31 MED ORDER — SODIUM CHLORIDE 0.9 % IR SOLN
80.0000 mg | Status: DC
  Filled 2013-05-31: qty 2

## 2013-05-31 MED ORDER — CEFAZOLIN SODIUM-DEXTROSE 2-3 GM-% IV SOLR
2.0000 g | INTRAVENOUS | Status: DC
  Filled 2013-05-31: qty 50

## 2013-05-31 NOTE — Telephone Encounter (Signed)
New Problem:  Pt's caregiver states she just missed a call regarding pt's hospital procedure. Please advise

## 2013-05-31 NOTE — Telephone Encounter (Signed)
Spoke with patient caregiver,moved procedure to 8:30 am.

## 2013-06-01 ENCOUNTER — Ambulatory Visit (HOSPITAL_COMMUNITY)
Admission: RE | Admit: 2013-06-01 | Discharge: 2013-06-01 | Disposition: A | Discharge status: Home / Self Care | Payer: Medicare Other | Source: Ambulatory Visit | Attending: Internal Medicine | Admitting: Internal Medicine

## 2013-06-01 ENCOUNTER — Encounter (HOSPITAL_COMMUNITY)
Admission: RE | Disposition: A | Discharge status: Home / Self Care | Payer: Self-pay | Source: Ambulatory Visit | Attending: Internal Medicine

## 2013-06-01 DIAGNOSIS — I2510 Atherosclerotic heart disease of native coronary artery without angina pectoris: Secondary | ICD-10-CM | POA: Insufficient documentation

## 2013-06-01 DIAGNOSIS — I4729 Other ventricular tachycardia: Secondary | ICD-10-CM | POA: Insufficient documentation

## 2013-06-01 DIAGNOSIS — I495 Sick sinus syndrome: Secondary | ICD-10-CM | POA: Insufficient documentation

## 2013-06-01 DIAGNOSIS — E785 Hyperlipidemia, unspecified: Secondary | ICD-10-CM | POA: Insufficient documentation

## 2013-06-01 DIAGNOSIS — I472 Ventricular tachycardia, unspecified: Secondary | ICD-10-CM | POA: Insufficient documentation

## 2013-06-01 DIAGNOSIS — J449 Chronic obstructive pulmonary disease, unspecified: Secondary | ICD-10-CM | POA: Insufficient documentation

## 2013-06-01 DIAGNOSIS — Z951 Presence of aortocoronary bypass graft: Secondary | ICD-10-CM | POA: Insufficient documentation

## 2013-06-01 DIAGNOSIS — Z45018 Encounter for adjustment and management of other part of cardiac pacemaker: Secondary | ICD-10-CM | POA: Insufficient documentation

## 2013-06-01 DIAGNOSIS — I519 Heart disease, unspecified: Secondary | ICD-10-CM | POA: Insufficient documentation

## 2013-06-01 DIAGNOSIS — J4489 Other specified chronic obstructive pulmonary disease: Secondary | ICD-10-CM | POA: Insufficient documentation

## 2013-06-01 DIAGNOSIS — G4733 Obstructive sleep apnea (adult) (pediatric): Secondary | ICD-10-CM | POA: Insufficient documentation

## 2013-06-01 DIAGNOSIS — N289 Disorder of kidney and ureter, unspecified: Secondary | ICD-10-CM | POA: Insufficient documentation

## 2013-06-01 DIAGNOSIS — I10 Essential (primary) hypertension: Secondary | ICD-10-CM | POA: Insufficient documentation

## 2013-06-01 DIAGNOSIS — I4891 Unspecified atrial fibrillation: Secondary | ICD-10-CM | POA: Insufficient documentation

## 2013-06-01 HISTORY — PX: PERMANENT PACEMAKER GENERATOR CHANGE: SHX6022

## 2013-06-01 LAB — SURGICAL PCR SCREEN: Staphylococcus aureus: NEGATIVE

## 2013-06-01 LAB — PROTIME-INR: INR: 2.49 — ABNORMAL HIGH (ref 0.00–1.49)

## 2013-06-01 SURGERY — PERMANENT PACEMAKER GENERATOR CHANGE
Anesthesia: LOCAL

## 2013-06-01 MED ORDER — MUPIROCIN 2 % EX OINT
TOPICAL_OINTMENT | Freq: Two times a day (BID) | CUTANEOUS | Status: DC
  Administered 2013-06-01: 07:00:00 via NASAL
  Filled 2013-06-01: qty 22

## 2013-06-01 MED ORDER — FENTANYL CITRATE 0.05 MG/ML IJ SOLN
INTRAMUSCULAR | Status: AC
  Filled 2013-06-01: qty 2

## 2013-06-01 MED ORDER — ACETAMINOPHEN 325 MG PO TABS: 325.0000 mg | ORAL_TABLET | ORAL | Status: DC | PRN

## 2013-06-01 MED ORDER — SODIUM CHLORIDE 0.9 % IV SOLN
INTRAVENOUS | Status: DC
  Administered 2013-06-01: 07:00:00 via INTRAVENOUS

## 2013-06-01 MED ORDER — MUPIROCIN 2 % EX OINT
TOPICAL_OINTMENT | CUTANEOUS | Status: AC
  Filled 2013-06-01: qty 22

## 2013-06-01 MED ORDER — MIDAZOLAM HCL 5 MG/5ML IJ SOLN
INTRAMUSCULAR | Status: AC
  Filled 2013-06-01: qty 5

## 2013-06-01 MED ORDER — ONDANSETRON HCL 4 MG/2ML IJ SOLN: 4.0000 mg | Freq: Four times a day (QID) | INTRAMUSCULAR | Status: DC | PRN

## 2013-06-01 MED ORDER — LIDOCAINE HCL (PF) 1 % IJ SOLN
INTRAMUSCULAR | Status: AC
  Filled 2013-06-01: qty 60

## 2013-06-01 MED ORDER — CHLORHEXIDINE GLUCONATE 4 % EX LIQD
60.0000 mL | Freq: Once | CUTANEOUS | Status: DC
  Filled 2013-06-01: qty 60

## 2013-06-01 NOTE — CV Procedure (Signed)
Electro-Physiology procedure note  Procedure: Removal of a previous implanted dual-chamber pacemaker which had reached elective replacement and insertion of a new single-chamber pacemaker  Indication: Symptomatic sinus node dysfunction, status post prior dual-chamber pacemaker now with atrial fibrillation and a slow ventricular response  Findings: After informed consent was obtained, the patient was taken to the diagnostic electrophysiology laboratory in the fasting state. After the usual preparation and draping, intravenous fentanyl and Versed were given for sedation. 30 cc of lidocaine was infiltrated into the left pectoral region and over the old pacemaker incision. A 5 cm incision was carried out over this region and electrocautery was utilized to dissect down to the pacemaker pocket. The pacemaker was removed with gentle traction. The leads were evaluated and found to be working satisfactorily. R waves were 16 mV, pacing impedance 480 ohms, and the pacing threshold 0.2 V at 0.5 ms. The Medtronic Sensia single-chamber pacemaker, serial number and NWR [id number], was connected to the old ventricular pacing lead. The atrial lead was. The atrial lead and the new pacemaker and old ventricular lead was placed in the subcutaneous pocket. The pocket was ear again with antibiotic irrigation. The incision was closed with 2-0 and 3-0 Vicryl suture. Benzoin and Steri-Strips were painted on the skin and the patient was returned to his room in satisfactory condition.  Complications: There were no immediate complications  Conclusion: This demonstrates successful removal of a previous implanted dual-chamber pacemaker and insertion of a single chamber pacemaker with capping of the old atrial pacing lead, without immediate procedure complication.  Lewayne Bunting, M.D.

## 2013-06-01 NOTE — H&P (View-Only) (Signed)
 ELECTROPHYSIOLOGY OFFICE NOTE  Patient ID: [name] Gill MRN: [id number], DOB/AGE: 03/03/1926   Date of Visit: 05/27/2013  Primary Physician: STONEKING,HAL THOMAS, MD Primary Cardiologist / Primary EP: Traci Turner, MD / Steve Klein, MD Reason for Visit: EP/device follow-up  History of Present Illness  [name] Gill is a 79 y.o. male with bradycardia s/p PPM implant, permanent AFib, CAD, HTN and COPD who presents today for electrophysiology followup. His pacemaker has been followed by Dr. Turner. His battery has reached ERI.  Today, he reports he is doing well and has no complaints. He denies chest pain or shortness of breath. He denies palpitations, dizziness, near syncope or syncope. He denies LE swelling, orthopnea, PND or recent weight gain. He is compliant with his medications.  Past Medical History Past Medical History  Diagnosis Date  . OSA (obstructive sleep apnea)     04-04-08 NPSG AHI 14.5, 3L/M suppl O2 for nadir 86%  . Atrial fibrillation     coumadin, pacemaker  . Diverticulitis 1975  . CHD (coronary heart disease)   . Hypertension   . COPD (chronic obstructive pulmonary disease)     FEV1 1.35/58% 01-11-10  . Hyperlipidemia   . Pacemaker -Medtronic     Medtronic  . Renal insufficiency   . Ventricular tachycardia, non-sustained     Detected on the device    Past Surgical History Past Surgical History  Procedure Laterality Date  . Broken nose    . Coronary artery bypass graft  1975,1994  . Pacemaker insertion  02/2008    medtronic    Allergies/Intolerances Allergies  Allergen Reactions  . Testosterone Swelling    In legs    Current Home Medications Current Outpatient Prescriptions  Medication Sig Dispense Refill  . albuterol (PROVENTIL HFA;VENTOLIN HFA) 108 (90 BASE) MCG/ACT inhaler Inhale 2 puffs into the lungs every 6 (six) hours as needed for wheezing or shortness of breath. Rescue  1 Inhaler  2  . Ascorbic Acid (VITAMIN C) 500 MG tablet Take 500 mg by  mouth daily.        . atorvastatin (LIPITOR) 80 MG tablet Take 1 tablet by mouth daily.      . beta carotene w/minerals (OCUVITE) tablet Take 1 tablet by mouth daily.        . Cholecalciferol (VITAMIN D PO) Take by mouth daily.        . diltiazem (CARDIZEM LA) 180 MG 24 hr tablet Take 180 mg by mouth daily.        . fenofibrate 54 MG tablet Take 1 tablet by mouth Daily.      . FOLIC ACID PO Take by mouth daily.        . furosemide (LASIX) 80 MG tablet TAKE 1 TABLET BY MOUTH DAILY  90 tablet  3  . Multiple Vitamin (MULTIVITAMIN PO) Take by mouth daily.        . NITROSTAT 0.4 MG SL tablet as needed. Take as directed      . NON FORMULARY QUALAQUIN 4-5 OUNCE A DAY      . Omega-3 Fatty Acids (FISH OIL) 1000 MG CAPS Take by mouth. 2 tabs twice daily       . pantoprazole (PROTONIX) 40 MG tablet Take 40 mg by mouth daily.        . potassium chloride (KLOR-CON 10) 10 MEQ CR tablet Take 10 mEq by mouth daily.        . SELENIUM PO Take by mouth daily.        .   SPIRIVA HANDIHALER 18 MCG inhalation capsule INHALE CONTENTS OF 1 CAPSULE BY MOUTH DAILY  90 capsule  2  . SYMBICORT 160-4.5 MCG/ACT inhaler INHALE 2 PUFFS INTO THE LUNGS TWICE DAILY  10.2 g  4  . warfarin (COUMADIN) 2.5 MG tablet Take as directed        No current facility-administered medications for this visit.    Social History History   Social History  . Marital Status: Married    Spouse Name: N/A    Number of Children: N/A  . Years of Education: N/A   Occupational History  . retired     [employer]   Social History Main Topics  . Smoking status: Former Smoker -- 2.00 packs/day for 55 years    Types: Cigarettes    Quit date: 07/21/1998  . Smokeless tobacco: Never Used  . Alcohol Use: Not on file  . Drug Use: Not on file  . Sexual Activity: Not on file   Other Topics Concern  . Not on file   Social History Narrative  . No narrative on file     Review of Systems General: No chills, fever, night sweats or  weight changes Cardiovascular: No chest pain, dyspnea on exertion, edema, orthopnea, palpitations, paroxysmal nocturnal dyspnea Dermatological: No rash, lesions or masses Respiratory: No cough, dyspnea Urologic: No hematuria, dysuria Abdominal: No nausea, vomiting, diarrhea, bright red blood per rectum, melena, or hematemesis Neurologic: No visual changes, weakness, changes in mental status All other systems reviewed and are otherwise negative except as noted above.  Physical Exam Vitals: Blood pressure 140/82, pulse 84, height 5' 6" (1.676 m), weight 197 lb 12.8 oz (89.721 kg).  General: Well developed, well appearing 79 y.o. male in no acute distress. HEENT: Normocephalic, atraumatic. EOMs intact. Sclera nonicteric. Oropharynx clear.  Neck: Supple. No JVD. Lungs: Respirations regular and unlabored, CTA bilaterally. No wheezes, rales or rhonchi. Heart: RRR. S1, S2 present. No murmurs, rub, S3 or S4. Abdomen: Soft, non-distended.   Extremities: No clubbing, cyanosis or edema. PT/Radials 2+ and equal bilaterally. Psych: Normal affect. Neuro: Alert and oriented X 3. Moves all extremities spontaneously.   Diagnostics Device interrogation - recent interrogation reviewed 05/25/2013 - battery at ERI since 02/05/2013; in AF 100% of time; sensing and thresholds within normal range; V paced 49.5%  Assessment and Plan 1. Bradycardia s/p PPM implant 2. Permanent AFib 3. CAD 4. HTN 5. COPD  [name] Gill presents for EP follow-up. His PPM battery has been at ERI >3 months. Discussed the need for PPM generator change with and his daughter. Risks, benefits and alternatives to PPM generator change were discussed in detail. These risks include, but are not limited to, bleeding and infection. [name] Gill expressed verbal understanding and wishes to proceed. Given that his PPM battery has been at ERI >3 months we will schedule this as soon as possible.  Signed, Donnamae Muilenburg, PA-C 05/27/2013, 2:07  PM   

## 2013-06-01 NOTE — Interval H&P Note (Signed)
History and Physical Interval Note: Patient seen and examined. Agree with above, history, exam, assessment and plan. He is at [facility] and presents today for PM removal and insertion of a new PPM. 06/01/2013 6:54 AM  [name] Gill  has presented today for surgery, with the diagnosis of Afib  The various methods of treatment have been discussed with the patient and family. After consideration of risks, benefits and other options for treatment, the patient has consented to  Procedure(s): PERMANENT PACEMAKER GENERATOR CHANGE (N/A) as a surgical intervention .  The patient's history has been reviewed, patient examined, no change in status, stable for surgery.  I have reviewed the patient's chart and labs.  Questions were answered to the patient's satisfaction.     Lewayne Bunting ,M.D.

## 2013-06-01 NOTE — Progress Notes (Signed)
Discharge instruction given per MD order.  Pt and CG verbalized understanding.  Pt to car via wheelchair.

## 2013-06-02 ENCOUNTER — Encounter: Payer: Self-pay | Admitting: Internal Medicine

## 2013-06-06 ENCOUNTER — Telehealth: Payer: Self-pay | Admitting: Internal Medicine

## 2013-06-06 NOTE — Telephone Encounter (Signed)
New Problem   Pt called. States that there was tape over the incision and he has taken it off.// he states that theres is another layer that's covering the incision and he is asking to remove it// please call back to discuss.

## 2013-06-08 NOTE — Telephone Encounter (Signed)
Pt still has steri-strips. Instructed him to leave steri-strips on until wound check appt. Also reminded him to take sponge baths, not showers with running water over the incision site.   Wound check appt 06/15/13.

## 2013-06-11 DIAGNOSIS — D490 Neoplasm of unspecified behavior of digestive system: Secondary | ICD-10-CM | POA: Insufficient documentation

## 2013-06-11 NOTE — Assessment & Plan Note (Signed)
Control seems good and pressures appropriate. Dry mouth may be made worse by CPAP airflow despite nebulizer, but is mostly from his drying medications He can ask pharmacist about alternatives to Biotene that might work better for him. Discussed nasal saline gel

## 2013-06-11 NOTE — Assessment & Plan Note (Signed)
Clinically stable. 

## 2013-06-11 NOTE — Assessment & Plan Note (Signed)
This is just noticed after a cold and may be a benign adenitis, likely to resolve. He understands we need to keep watching it

## 2013-06-15 ENCOUNTER — Ambulatory Visit (INDEPENDENT_AMBULATORY_CARE_PROVIDER_SITE_OTHER): Payer: Medicare Other | Admitting: *Deleted

## 2013-06-15 DIAGNOSIS — I4891 Unspecified atrial fibrillation: Secondary | ICD-10-CM

## 2013-06-15 LAB — MDC_IDC_ENUM_SESS_TYPE_INCLINIC
Battery Impedance: 100 Ohm
Battery Remaining Longevity: 133 mo
Brady Statistic RV Percent Paced: 32 %
Date Time Interrogation Session: 20141126144411
Lead Channel Impedance Value: 539 Ohm
Lead Channel Pacing Threshold Amplitude: 0.5 V
Lead Channel Pacing Threshold Pulse Width: 0.4 ms
Lead Channel Sensing Intrinsic Amplitude: 15.67 mV
Lead Channel Setting Pacing Pulse Width: 0.4 ms

## 2013-06-15 NOTE — Progress Notes (Signed)
Wound check appointment. Steri-strips removed. Wound without redness or edema. Incision edges approximated, wound well healed. Normal device function. Thresholds, sensing, and impedances consistent with implant measurements. Device programmed at 3.5V/auto capture programmed on for extra safety margin until 3 month visit. Histogram distribution appropriate for patient and level of activity. No high ventricular rates noted. Patient educated about wound care, arm mobility, lifting restrictions. ROV in 3 months wit

## 2013-06-17 ENCOUNTER — Other Ambulatory Visit: Payer: Self-pay | Admitting: Cardiology

## 2013-06-20 ENCOUNTER — Other Ambulatory Visit: Payer: Self-pay | Admitting: Cardiology

## 2013-07-06 ENCOUNTER — Ambulatory Visit (INDEPENDENT_AMBULATORY_CARE_PROVIDER_SITE_OTHER): Payer: Medicare Other | Admitting: Pharmacist

## 2013-07-06 DIAGNOSIS — I4891 Unspecified atrial fibrillation: Secondary | ICD-10-CM

## 2013-07-12 ENCOUNTER — Encounter: Payer: Self-pay | Admitting: Internal Medicine

## 2013-07-20 ENCOUNTER — Ambulatory Visit (INDEPENDENT_AMBULATORY_CARE_PROVIDER_SITE_OTHER): Payer: Medicare Other | Admitting: Pharmacist

## 2013-07-20 DIAGNOSIS — I4891 Unspecified atrial fibrillation: Secondary | ICD-10-CM

## 2013-07-20 LAB — POCT INR: INR: 2.9

## 2013-08-17 ENCOUNTER — Ambulatory Visit (INDEPENDENT_AMBULATORY_CARE_PROVIDER_SITE_OTHER): Payer: Medicare Other | Admitting: Pharmacist

## 2013-08-17 DIAGNOSIS — I4891 Unspecified atrial fibrillation: Secondary | ICD-10-CM

## 2013-08-17 LAB — POCT INR: INR: 3

## 2013-08-26 ENCOUNTER — Telehealth: Payer: Self-pay | Admitting: Internal Medicine

## 2013-08-26 NOTE — Telephone Encounter (Signed)
Patient had questions regarding warfarin and the NOAC agents in a patient with a recent DVT, as this is what his wife had recently.  I answered his questions thoroughly.  No further intervention or discussion needed.

## 2013-08-26 NOTE — Telephone Encounter (Signed)
New message  Patient has question about his coumadin, please call & advise.

## 2013-09-06 ENCOUNTER — Encounter: Payer: Medicare Other | Admitting: Internal Medicine

## 2013-09-07 ENCOUNTER — Ambulatory Visit
Admission: RE | Admit: 2013-09-07 | Discharge: 2013-09-07 | Disposition: A | Discharge status: Home / Self Care | Payer: Medicare Other | Source: Ambulatory Visit | Attending: Otolaryngology | Admitting: Otolaryngology

## 2013-09-07 ENCOUNTER — Other Ambulatory Visit: Payer: Self-pay | Admitting: Otolaryngology

## 2013-09-07 DIAGNOSIS — K118 Other diseases of salivary glands: Secondary | ICD-10-CM

## 2013-09-07 MED ORDER — IOHEXOL 300 MG/ML  SOLN
75.0000 mL | Freq: Once | INTRAMUSCULAR | Status: AC | PRN
  Administered 2013-09-07: 75 mL via INTRAVENOUS

## 2013-09-08 ENCOUNTER — Other Ambulatory Visit: Payer: Self-pay | Admitting: Dermatology

## 2013-09-12 ENCOUNTER — Telehealth: Payer: Self-pay | Admitting: Cardiology

## 2013-09-12 ENCOUNTER — Ambulatory Visit (INDEPENDENT_AMBULATORY_CARE_PROVIDER_SITE_OTHER): Payer: Medicare Other | Admitting: Podiatry

## 2013-09-12 ENCOUNTER — Encounter: Payer: Self-pay | Admitting: Podiatry

## 2013-09-12 VITALS — BP 154/66 | HR 84 | Resp 12

## 2013-09-12 DIAGNOSIS — B351 Tinea unguium: Secondary | ICD-10-CM

## 2013-09-12 DIAGNOSIS — M79609 Pain in unspecified limb: Secondary | ICD-10-CM

## 2013-09-12 NOTE — Telephone Encounter (Signed)
New Problem:  Pt states he will be having surgery with Dr. Lucia Gaskins. States he has a tumor on his neck... Pt was asked to find out if there were risks with having anesthesia.. Pt would like a call back.

## 2013-09-12 NOTE — Telephone Encounter (Signed)
To Dr Radford Pax to review. I put his records from [place] in your To Sign folder to review. Please return records so I can abstract pt before his OV in March 2015

## 2013-09-12 NOTE — Telephone Encounter (Signed)
Pt is aware.  

## 2013-09-12 NOTE — Telephone Encounter (Signed)
At last OV he was stable from a cardiac standpoint so he is at low risk from cardiac standpoint for surgery.  He would need to talk with anesthesiologist about risks of anesthesia in general.

## 2013-09-13 NOTE — Progress Notes (Signed)
Patient ID: [name] Gill, male   DOB: [birth year], 80 y.o.   MRN: [id number]  Subjective: This patient presents for ongoing debridement of painful mycotic toenails. The last visit for this service was 12/22/2012. He has been a patient in this practice since 2012.  Objective: Elongated, hypertrophic, discolored toenails with palpable tenderness in all 10 toenails.  Assessment: Symptomatic onychomycoses x10  Plan: Nails x10 are debrided back without a bleeding. Reappoint at patient's request, or at three-month intervals.

## 2013-09-15 ENCOUNTER — Ambulatory Visit: Payer: Medicare Other | Admitting: Cardiology

## 2013-09-20 ENCOUNTER — Encounter: Payer: Self-pay | Admitting: Internal Medicine

## 2013-09-20 ENCOUNTER — Other Ambulatory Visit: Payer: Self-pay | Admitting: Otolaryngology

## 2013-09-20 ENCOUNTER — Ambulatory Visit (INDEPENDENT_AMBULATORY_CARE_PROVIDER_SITE_OTHER): Payer: Medicare Other | Admitting: *Deleted

## 2013-09-20 ENCOUNTER — Ambulatory Visit (INDEPENDENT_AMBULATORY_CARE_PROVIDER_SITE_OTHER): Payer: Medicare Other | Admitting: Internal Medicine

## 2013-09-20 VITALS — BP 138/82 | HR 75 | Ht 66.5 in | Wt 213.8 lb

## 2013-09-20 DIAGNOSIS — R911 Solitary pulmonary nodule: Secondary | ICD-10-CM

## 2013-09-20 DIAGNOSIS — Z950 Presence of cardiac pacemaker: Secondary | ICD-10-CM

## 2013-09-20 DIAGNOSIS — I4891 Unspecified atrial fibrillation: Secondary | ICD-10-CM

## 2013-09-20 LAB — MDC_IDC_ENUM_SESS_TYPE_INCLINIC
Battery Impedance: 113 Ohm
Brady Statistic RV Percent Paced: 34 %
Date Time Interrogation Session: 20150303162848
Lead Channel Impedance Value: 0 Ohm
Lead Channel Impedance Value: 538 Ohm
Lead Channel Pacing Threshold Pulse Width: 0.4 ms
Lead Channel Sensing Intrinsic Amplitude: 11.2 mV
Lead Channel Setting Sensing Sensitivity: 5.6 mV
MDC IDC MSMT BATTERY REMAINING LONGEVITY: 128 mo
MDC IDC MSMT BATTERY VOLTAGE: 2.8 V
MDC IDC MSMT LEADCHNL RV PACING THRESHOLD AMPLITUDE: 0.5 V
MDC IDC SET LEADCHNL RV PACING AMPLITUDE: 2 V
MDC IDC SET LEADCHNL RV PACING PULSEWIDTH: 0.4 ms

## 2013-09-20 LAB — POCT INR: INR: 3.7

## 2013-09-20 NOTE — Assessment & Plan Note (Signed)
His medtronic single chamber ppm is working normally. Will recheck in several months.

## 2013-09-20 NOTE — Patient Instructions (Signed)
Your physician wants you to follow-up in: 9 months with Dr Knox Saliva will receive a reminder letter in the mail two months in advance. If you don't receive a letter, please call our office to schedule the follow-up appointment.  Remote monitoring is used to monitor your Pacemaker or ICD from home. This monitoring reduces the number of office visits required to check your device to one time per year. It allows [place] to keep an eye on the functioning of your device to ensure it is working properly. You are scheduled for a device check from home on 12/22/13. You may send your transmission at any time that day. If you have a wireless device, the transmission will be sent automatically. After your physician reviews your transmission, you will receive a postcard with your next transmission date.

## 2013-09-20 NOTE — Progress Notes (Signed)
HPI [name] Gill returns today for PPM followup. He is a pleasant 80 yo man with a h/o chronic atrial fibrillation and symptomatic bradycardia, s/p PPM insertion several months ago. He has had no syncope in the interim. He has class 2 dyspnea which is multifactorial. He denies chest pain. He has moderate copd and sleep apnea. Allergies  Allergen Reactions  . Testosterone Swelling    In legs     Current Outpatient Prescriptions  Medication Sig Dispense Refill  . albuterol (PROVENTIL HFA;VENTOLIN HFA) 108 (90 BASE) MCG/ACT inhaler Inhale 2 puffs into the lungs every 6 (six) hours as needed for wheezing or shortness of breath. Rescue  1 Inhaler  2  . Ascorbic Acid (VITAMIN C) 500 MG tablet Take 500 mg by mouth daily.        [name] atorvastatin (LIPITOR) 80 MG tablet Take 80 mg by mouth daily.       . beta carotene w/minerals (OCUVITE) tablet Take 1 tablet by mouth daily.       . budesonide-formoterol (SYMBICORT) 160-4.5 MCG/ACT inhaler Inhale 2 puffs into the lungs 2 (two) times daily.      . Cholecalciferol (VITAMIN D PO) Take 1 tablet by mouth daily.       [name] diltiazem (CARDIZEM CD) 180 MG 24 hr capsule TAKE 1 CAPSULE BY MOUTH DAILY  30 capsule  6  . fenofibrate 54 MG tablet Take 54 mg by mouth Daily.       [name] FOLIC ACID PO Take 1 tablet by mouth daily.       . furosemide (LASIX) 80 MG tablet Take 80 mg by mouth daily.      . Multiple Vitamin (MULTIVITAMIN PO) Take 1 tablet by mouth daily.       [name] NITROSTAT 0.4 MG SL tablet Place 0.4 mg under the tongue every 5 (five) minutes as needed for chest pain. Take as directed      . Omega-3 Fatty Acids (FISH OIL) 1000 MG CAPS Take 3,000 mg by mouth daily. 2 tabs twice daily      . OVER THE COUNTER MEDICATION Take 4-5 oz by mouth every evening. "Diet tonic water"- quinine      . pantoprazole (PROTONIX) 40 MG tablet Take 40 mg by mouth daily.       . potassium chloride (KLOR-CON 10) 10 MEQ CR tablet Take 10 mEq by mouth daily.        . SELENIUM PO  Take 1 tablet by mouth daily.       [name] tiotropium (SPIRIVA) 18 MCG inhalation capsule Place 18 mcg into inhaler and inhale daily.      [name] warfarin (COUMADIN) 2.5 MG tablet Take 5 mg by mouth daily at 6 PM. Take as directed       No current facility-administered medications for this visit.     Past Medical History  Diagnosis Date  . OSA (obstructive sleep apnea)     04-04-08 NPSG AHI 14.5, 3L/M suppl O2 for nadir 86%  . Atrial fibrillation     coumadin, pacemaker  . Diverticulitis 1975  . CHD (coronary heart disease)   . Hypertension   . COPD (chronic obstructive pulmonary disease)     FEV1 1.35/58% 01-11-10  . Hyperlipidemia   . Pacemaker -Medtronic     Medtronic  . Renal insufficiency   . Ventricular tachycardia, non-sustained     Detected on the device    ROS:   All systems reviewed and negative except as noted  in the HPI.   Past Surgical History  Procedure Laterality Date  . Broken nose    . Coronary artery bypass graft  1771,1657  . Pacemaker insertion  02/2008    medtronic     Family History  Problem Relation Age of Onset  . Tuberculosis Father     arrested  . Cancer Father   . Aortic stenosis Mother     ASCVD     History   Social History  . Marital Status: Married    Spouse Name: N/A    Number of Children: N/A  . Years of Education: N/A   Occupational History  . retired     [employer]   Social History Main Topics  . Smoking status: Former Smoker -- 2.00 packs/day for 55 years    Types: Cigarettes    Quit date: 07/21/1998  . Smokeless tobacco: Never Used  . Alcohol Use: Not on file  . Drug Use: Not on file  . Sexual Activity: Not on file   Other Topics Concern  . Not on file   Social History Narrative  . No narrative on file     BP 138/82  Pulse 75  Ht 5' 6.5" (1.689 m)  Wt 213 lb 12.8 oz (96.979 kg)  BMI 34.00 kg/m2  Physical Exam:  Well appearing elderly man, NAD HEENT: Unremarkable Neck:  No JVD, no  thyromegally Back:  No CVA tenderness Lungs:  Clear with no wheezes HEART:  Regular rate rhythm, no murmurs, no rubs, no clicks Abd:  soft, positive bowel sounds, no organomegally, no rebound, no guarding Ext:  2 plus pulses, no edema, no cyanosis, no clubbing Skin:  No rashes no nodules Neuro:  CN II through XII intact, motor grossly intact  DEVICE  Normal device function.  See PaceArt for details.   Assess/Plan:

## 2013-09-20 NOTE — Assessment & Plan Note (Signed)
His ventricular rate is well controlled. No change in medical therapy.

## 2013-09-23 ENCOUNTER — Ambulatory Visit
Admission: RE | Admit: 2013-09-23 | Discharge: 2013-09-23 | Disposition: A | Discharge status: Home / Self Care | Payer: Medicare Other | Source: Ambulatory Visit | Attending: Otolaryngology | Admitting: Otolaryngology

## 2013-09-23 DIAGNOSIS — R911 Solitary pulmonary nodule: Secondary | ICD-10-CM

## 2013-09-23 MED ORDER — IOHEXOL 300 MG/ML  SOLN
50.0000 mL | Freq: Once | INTRAMUSCULAR | Status: AC | PRN
Start: 2013-09-23 — End: 2013-09-23
  Administered 2013-09-23: 50 mL via INTRAVENOUS

## 2013-09-27 ENCOUNTER — Other Ambulatory Visit (HOSPITAL_COMMUNITY): Payer: Self-pay | Admitting: Geriatric Medicine

## 2013-09-27 DIAGNOSIS — R911 Solitary pulmonary nodule: Secondary | ICD-10-CM

## 2013-10-03 ENCOUNTER — Encounter: Payer: Self-pay | Admitting: General Surgery

## 2013-10-04 ENCOUNTER — Ambulatory Visit (HOSPITAL_COMMUNITY)
Admission: RE | Admit: 2013-10-04 | Discharge: 2013-10-04 | Disposition: A | Discharge status: Home / Self Care | Payer: Medicare Other | Source: Ambulatory Visit | Attending: Geriatric Medicine | Admitting: Geriatric Medicine

## 2013-10-04 DIAGNOSIS — R911 Solitary pulmonary nodule: Secondary | ICD-10-CM | POA: Diagnosis not present

## 2013-10-04 DIAGNOSIS — K1120 Sialoadenitis, unspecified: Secondary | ICD-10-CM | POA: Diagnosis not present

## 2013-10-04 DIAGNOSIS — I517 Cardiomegaly: Secondary | ICD-10-CM | POA: Diagnosis not present

## 2013-10-04 DIAGNOSIS — N289 Disorder of kidney and ureter, unspecified: Secondary | ICD-10-CM | POA: Diagnosis not present

## 2013-10-04 DIAGNOSIS — N400 Enlarged prostate without lower urinary tract symptoms: Secondary | ICD-10-CM | POA: Insufficient documentation

## 2013-10-04 DIAGNOSIS — N269 Renal sclerosis, unspecified: Secondary | ICD-10-CM | POA: Insufficient documentation

## 2013-10-04 DIAGNOSIS — N433 Hydrocele, unspecified: Secondary | ICD-10-CM | POA: Diagnosis not present

## 2013-10-04 DIAGNOSIS — K4090 Unilateral inguinal hernia, without obstruction or gangrene, not specified as recurrent: Secondary | ICD-10-CM | POA: Insufficient documentation

## 2013-10-04 DIAGNOSIS — K8020 Calculus of gallbladder without cholecystitis without obstruction: Secondary | ICD-10-CM | POA: Diagnosis not present

## 2013-10-04 DIAGNOSIS — R9389 Abnormal findings on diagnostic imaging of other specified body structures: Secondary | ICD-10-CM | POA: Diagnosis not present

## 2013-10-04 DIAGNOSIS — I723 Aneurysm of iliac artery: Secondary | ICD-10-CM | POA: Insufficient documentation

## 2013-10-04 DIAGNOSIS — K439 Ventral hernia without obstruction or gangrene: Secondary | ICD-10-CM | POA: Diagnosis not present

## 2013-10-04 LAB — GLUCOSE, CAPILLARY: Glucose-Capillary: 97 mg/dL (ref 70–99)

## 2013-10-04 MED ORDER — FLUDEOXYGLUCOSE F - 18 (FDG) INJECTION
12.5000 | Freq: Once | INTRAVENOUS | Status: AC | PRN
  Administered 2013-10-04: 12.5 via INTRAVENOUS

## 2013-10-06 ENCOUNTER — Ambulatory Visit (INDEPENDENT_AMBULATORY_CARE_PROVIDER_SITE_OTHER): Payer: Medicare Other | Admitting: Internal Medicine

## 2013-10-06 ENCOUNTER — Encounter: Payer: Self-pay | Admitting: Internal Medicine

## 2013-10-06 ENCOUNTER — Encounter (INDEPENDENT_AMBULATORY_CARE_PROVIDER_SITE_OTHER): Payer: Self-pay

## 2013-10-06 VITALS — BP 124/68 | HR 79 | Ht 66.0 in | Wt 214.0 lb

## 2013-10-06 DIAGNOSIS — J438 Other emphysema: Secondary | ICD-10-CM

## 2013-10-06 DIAGNOSIS — R599 Enlarged lymph nodes, unspecified: Secondary | ICD-10-CM

## 2013-10-06 DIAGNOSIS — R911 Solitary pulmonary nodule: Secondary | ICD-10-CM

## 2013-10-06 DIAGNOSIS — G4733 Obstructive sleep apnea (adult) (pediatric): Secondary | ICD-10-CM

## 2013-10-06 DIAGNOSIS — E678 Other specified hyperalimentation: Secondary | ICD-10-CM

## 2013-10-06 DIAGNOSIS — J439 Emphysema, unspecified: Secondary | ICD-10-CM

## 2013-10-06 NOTE — Assessment & Plan Note (Signed)
Moderate obstructive disease with little response to bronchodilator and reduced Diffusion capacity. Complicated by CAD/ pacemaker, obesity. He and family deny acute symptoms, and he continues routine meds. He can probably tolerate limited resectional surgery for L lung nodule.

## 2013-10-06 NOTE — Assessment & Plan Note (Signed)
No success at wight loss

## 2013-10-06 NOTE — Patient Instructions (Signed)
Order - referral to Thoracic Surgery- Dr Servando Snare or Roxan Hockey   Dx Lung nodule CT and PET have been done)

## 2013-10-06 NOTE — Progress Notes (Signed)
[name], MD 11/18/2011 9:09 PM Pended  Patient ID: [name], male DOB: July 23, 1925, 80 y.o. MRN: [id number]  HPI  89 yoM followed for OSA and COPD complicated by CAD and AFib/ pacemaker. with obesity hypoventilation syndrome. Last here May 15, 2010. Since then he finished Pulmonary Rehab and says it was life changing. Pacing himself he can now walk around the block with a few rest stops. We talked about ways to maintain the momentum. Otherwise quiet winter- had one respiratory infection- not bad. Heart ok- no new problems or changes by Dr Radford Pax. FEV1 was 1.35/ 58% in 2011. Dr Felipa Eth did CXR during the winter.  He continues BIPAP 11/9 with O2 2 L/M, used at least 5-6 hours every night.  Denies routine cough, phlegm, chest pain, palpitation, bloody or purulent sputum.   05/16/11- 86 yoM followed for OSA and COPD complicated by CAD and AFib/ pacemaker, obesity hypoventilation syndrome.  Wife is here. Had flu shot and shingles vaccine. He feels he is doing very well. Pulmonary rehabilitation was a big help and he has lost 28 pounds since April. He is now in the maintenance program. Comfortable off of oxygen in the daytime. Using Spiriva and Symbicort. Never aware of wheeze. Denies cough or chest pain. He has an old nebulizer machine and rescue inhaler that he never needs. He doesn't notice that he wheezes.  He continues BiPAP 11/9 with oxygen at 2 L, every night for at least 5 or 6 hours. His wife comments that he now stays asleep on long drives and she thinks he is doing very well.   11/14/11- 23 yoM followed for OSA and COPD complicated by CAD and AFib/ pacemaker, obesity hypoventilation syndrome.  He continues to use BiPAP with inspiratory pressure 11/expiratory pressure 9 and says he is doing well with this.  He considers his COPD status "wonderful". Pulmonary rehabilitation as a big help and has made a significant difference in his strength over the past year. Has lost some weight.  Little cough or wheeze. Sleeps with oxygen 2 L. Using Symbicort twice daily and Spiriva. Occasional epistaxis and nasal stuffiness attributed to old nasal trauma.  11/18/11- 57 yoM followed for OSA and COPD complicated by CAD and AFib/ pacemaker, obesity hypoventilation syndrome.  PCP Dr Felipa Eth Just here 4 days ago. Wife brings him in today because he didn't tell me at last visit about recent problems she wanted me to know about. Oxygen saturation had been running 96-98% but has been lower in the past few days. Malaise. Had a gastroenteritis syndrome February 27 and similar March 21. In the last few days has felt tired, chills, feet swelling, stomach bloating. More short of breath off and on. Denies pain, fever, purulent discharge, leg pain or cramps. Was taking AndroGel but stopped last week. Has been eating a lot of licorice, up to 20 sticks/ day over the past month, initially to treat constipation. Then he just kept on because he liked it. Has history of mild renal insufficiency. Has been eating a banana a day and potassium supplement. He reports that the pulmonary rehabilitation nurse thought he might be fluid overloaded. In the last 2 days he had been eating boullion, accepting salt input in order to lose weight by avoiding sandwiches.  05/21/12- 15 yoM followed for OSA and COPD complicated by CAD and AFib/ pacemaker, obesity hypoventilation syndrome.  PCP Dr Felipa Eth   had flu vaccine Feels no longer needs O2-been about 4-5 months since used last; is having severe dry mouth(caused  tooth problems); would like to have another mask (not full face mask-[employer]). Had lost weight and since then has not used oxygen at all. Treated for atrial fibrillation with pacemaker. Doing very well and pulmonary rehabilitation program. Does not require oxygen during that exercise. Continues BiPAP 11/9/[employer] with no humidifier. I discussed the humidifier given his concern about dry mouth. COPD assessment test (CAT)  3/40  11/24/12 20 yoM followed for OSA and COPD complicated by CAD and AFib/ pacemaker, obesity hypoventilation syndrome     PCP Dr Felipa Eth FOLLOWS FOR: patient reports he is feeling much better with his breathing esp after losing 60lbs-- c/o excessive dry mouth--wearing BiPAP11/9  every night for approx 4 hrs per night- tolerating pressure ok He had lost weight, about 25 pounds by our scale while working in pulmonary rehabilitation. He admits he is gained some back. He is now going to [employer]. Denies cough wheeze, edema or chest pain. Using a nasal decongestant spray about twice daily and we discussed this, recommending he cut way down. CXR 11/19/11 IMPRESSION:  Mild basilar predominant pulmonary edema and small bilateral  effusions.  Original Report Authenticated By: Duayne Cal, M.D.  05/27/13- 40 yoM former smoker hefollowed for OSA and COPD complicated by CAD and AFib/ pacemaker, obesity hypoventilation syndrome     PCP Dr Felipa Eth FOLLOWS FOR:  Pt states he did have "cold" and was recently treated with abx. Wears BiPAP  11/9every night for about 4-5 hours; gets extreme dry mouth-has tried biotene since last visit and helps slightly. DME is [employer]. Dryness accentuated by furosemide and Spiriva. Still doing "Silver sneakers" As he was getting over a recent viral pattern cold syndrome he found a lymph node left neck -not tender CXR 5/ 7 / 14 IMPRESSION:  Mild chronic lung disease with bibasilar scarring. No acute  cardiopulmonary abnormality.  Original Report Authenticated By: Carl Best, M.D.  10/06/13- 81 yoM former smoker followed for OSA and COPD complicated by CAD and AFib/ pacemaker, obesity hypoventilation syndrome     PCP Dr Caleen Essex and wife here. L parotid lesion being followed by Dr Lucia Gaskins ENT. Work-up led to CXR and CT chest- RUL nodule w/ central cavitation. PET positive w/o evident mets. We reviewed images together. He denies unusual dyspnea, chest pain, cough  or wheeze and family concurs. No blood and no nodes or masses except L parotid.  He has f/u appt tomorrow w/ Dr Radford Pax Cardiology. BIPAP 11/9 with O2 2L for sleep/ [employer].  CT neck 09/07/13 IMPRESSION:  1. Within the left parotid gland primarily in the superficial lobe  there are 2 adjacent circumscribed soft tissue masses. The larger  more inferior measures up to 22 mm diameter. 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  2. No regional lymphadenopathy. No other neck mass.  3. There is a partially visible 13 mm right lung nodule, not evident  on the 11/24/2012 chest radiographs. Recommend followup chest CT (IV  contrast preferred but could be omitted if the patient has any  history of renal insufficiency) to evaluate persistence and/or  characterize further.  4. 15 mm right thyroid nodule, significance doubtful in this age  group.  Electronically Signed  By: Lars Pinks M.D.  On: 09/07/2013 12:20 CT CHEST WITH CONTRAST 09/20/13 IMPRESSION:  1. Centrally cavitary right upper lobe pulmonary nodule. Suspicious  for primary bronchogenic carcinoma. This is superimposed upon mild  to moderate centrilobular emphysema. Consider further evaluation  with PET.  2. Probable scarring at the left apex. This is somewhat nodular more  medially. This could be re-evaluated at followup PET.  3. Cardiomegaly with small bilateral pleural effusions, suggesting a  component of congestive heart failure/fluid overload.  4. Thoracic adenopathy. This is nonspecific in the setting of  congestive heart failure. Could be secondary. However, metastatic  disease cannot be excluded.  5. Pulmonary artery enlargement suggests pulmonary arterial  hypertension.  6. Cholelithiasis.  7. Incompletely imaged transverse colon containing ventral abdominal  wall hernia.  8. Indeterminate right-sided thyroid nodule. This could  be further  evaluated with thyroid ultrasound. However, given patient age and  other comorbidities, of questionable significance.  Electronically Signed  By: Abigail Miyamoto M.D.  On: 09/23/2013 10:17 PET 09/27/13 Review of Systems-See HPI Constitutional:   +Deliberate weight loss,   No-night sweats, fevers, chills, fatigue, lassitude. HEENT:   No-  headaches, difficulty swallowing, tooth/dental problems, sore throat,       No-  sneezing, itching, ear ache, +nasal congestion, post nasal drip,  CV:  No-   chest pain, orthopnea, PND, No-swelling in lower extremities,  No-anasarca, dizziness, palpitations Resp: +  shortness of breath with exertion or at rest.              No-   productive cough,  No non-productive cough,  No- coughing up of blood.              No-   change in color of mucus.  No- wheezing.   Skin: No-   rash or lesions. GI:  No-   heartburn, indigestion, abdominal pain, nausea, vomiting,   GU:  MS:  No-   joint pain or swelling.   Neuro-     nothing unusual Psych:  No- change in mood or affect. No depression or anxiety.  No memory loss.  PFT 01/11/10- moderate obstructive airways disease, insignificant response to bronchodilator. FVC 2.28/ 62%, FEV1 1.35/ 58%, FEV1/FVC 0.59, TLC 90%, DLCO 57%  Objective:   Physical Exam General- Alert, Oriented, Affect-appropriate, Distress- none acute. Obese. Skin- rash-none, lesions- none, excoriation- none Lymphadenopathy-  +mass at angle L jaw Head- atraumatic            Eyes- Gross vision intact, PERRLA, conjunctivae clear secretions            Ears- +Hearing aid            Nose- Clear, no-Septal dev, mucus, polyps, erosion, perforation             Throat- Mallampati III-IV , mucosa clear/ not very dry , drainage- none, tonsils- atrophic. Neck- flexible , trachea midline, no stridor , thyroid nl, carotid no bruit Chest - symmetrical excursion , unlabored           Heart/CV- IRR , no murmur , no gallop  , no rub, nl s1 s2                            - JVD +1cm , edema- none, stasis changes- none, varices- none           Lung- +basilar crackles,  cough- none , dullness-none, rub- none           Chest wall- +L pacemaker Abd-  Br/ Gen/ Rectal- Not done, not indicated Extrem- cyanosis- none, clubbing, none, atrophy- none, strength- nl Neuro- grossly intact to observation

## 2013-10-06 NOTE — Assessment & Plan Note (Signed)
Managed by Dr Lucia Gaskins

## 2013-10-06 NOTE — Assessment & Plan Note (Signed)
Continues good compliance and control

## 2013-10-06 NOTE — Assessment & Plan Note (Signed)
RUL nodule- CT 09/20/13, PET 09/27/13 No evident mets. Underlying COPD, CAD/AFib/ Warfarin, Obesity. Not likely related to his L parotid lesion. This is new since CXR 11/2012, and I explained to pt and family that it should be handled as a lung cancer.  Needle bx probably unnecessary, if TSGY feels it can be resected primarily.  Plan- refer to Thoracic Surgery.

## 2013-10-07 ENCOUNTER — Telehealth: Payer: Self-pay | Admitting: Cardiology

## 2013-10-07 ENCOUNTER — Other Ambulatory Visit (HOSPITAL_COMMUNITY)
Admission: RE | Admit: 2013-10-07 | Discharge: 2013-10-07 | Disposition: A | Discharge status: Home / Self Care | Payer: Medicare Other | Source: Ambulatory Visit | Attending: Otolaryngology | Admitting: Otolaryngology

## 2013-10-07 ENCOUNTER — Encounter: Payer: Self-pay | Admitting: Cardiothoracic Surgery

## 2013-10-07 ENCOUNTER — Ambulatory Visit (INDEPENDENT_AMBULATORY_CARE_PROVIDER_SITE_OTHER): Payer: Medicare Other | Admitting: *Deleted

## 2013-10-07 ENCOUNTER — Encounter: Payer: Self-pay | Admitting: Cardiology

## 2013-10-07 ENCOUNTER — Ambulatory Visit (INDEPENDENT_AMBULATORY_CARE_PROVIDER_SITE_OTHER): Payer: Medicare Other | Admitting: Cardiology

## 2013-10-07 ENCOUNTER — Institutional Professional Consult (permissible substitution) (INDEPENDENT_AMBULATORY_CARE_PROVIDER_SITE_OTHER): Payer: Medicare Other | Admitting: Cardiothoracic Surgery

## 2013-10-07 VITALS — BP 130/70 | HR 72 | Ht 66.0 in | Wt 213.0 lb

## 2013-10-07 VITALS — BP 124/74 | HR 80 | Resp 20 | Ht 66.0 in | Wt 213.0 lb

## 2013-10-07 DIAGNOSIS — I4891 Unspecified atrial fibrillation: Secondary | ICD-10-CM

## 2013-10-07 DIAGNOSIS — G4733 Obstructive sleep apnea (adult) (pediatric): Secondary | ICD-10-CM

## 2013-10-07 DIAGNOSIS — R222 Localized swelling, mass and lump, trunk: Secondary | ICD-10-CM

## 2013-10-07 DIAGNOSIS — Z7901 Long term (current) use of anticoagulants: Secondary | ICD-10-CM

## 2013-10-07 DIAGNOSIS — I2510 Atherosclerotic heart disease of native coronary artery without angina pectoris: Secondary | ICD-10-CM

## 2013-10-07 DIAGNOSIS — I495 Sick sinus syndrome: Secondary | ICD-10-CM

## 2013-10-07 DIAGNOSIS — R0602 Shortness of breath: Secondary | ICD-10-CM

## 2013-10-07 DIAGNOSIS — I77810 Thoracic aortic ectasia: Secondary | ICD-10-CM | POA: Insufficient documentation

## 2013-10-07 DIAGNOSIS — Z5181 Encounter for therapeutic drug level monitoring: Secondary | ICD-10-CM

## 2013-10-07 DIAGNOSIS — K119 Disease of salivary gland, unspecified: Secondary | ICD-10-CM | POA: Insufficient documentation

## 2013-10-07 DIAGNOSIS — E678 Other specified hyperalimentation: Secondary | ICD-10-CM

## 2013-10-07 DIAGNOSIS — R011 Cardiac murmur, unspecified: Secondary | ICD-10-CM

## 2013-10-07 DIAGNOSIS — R918 Other nonspecific abnormal finding of lung field: Secondary | ICD-10-CM

## 2013-10-07 DIAGNOSIS — Z01818 Encounter for other preprocedural examination: Secondary | ICD-10-CM

## 2013-10-07 DIAGNOSIS — E785 Hyperlipidemia, unspecified: Secondary | ICD-10-CM

## 2013-10-07 DIAGNOSIS — Z950 Presence of cardiac pacemaker: Secondary | ICD-10-CM

## 2013-10-07 DIAGNOSIS — R911 Solitary pulmonary nodule: Secondary | ICD-10-CM

## 2013-10-07 LAB — BASIC METABOLIC PANEL
BUN: 30 mg/dL — AB (ref 6–23)
CO2: 31 meq/L (ref 19–32)
Calcium: 9.3 mg/dL (ref 8.4–10.5)
Chloride: 102 mEq/L (ref 96–112)
Creatinine, Ser: 1.5 mg/dL (ref 0.4–1.5)
GFR: 46.84 mL/min — AB (ref >60.00)
Glucose, Bld: 102 mg/dL — ABNORMAL HIGH (ref 70–99)
POTASSIUM: 4.5 meq/L (ref 3.5–5.1)
Sodium: 140 mEq/L (ref 135–145)

## 2013-10-07 LAB — BRAIN NATRIURETIC PEPTIDE: Pro B Natriuretic peptide (BNP): 248 pg/mL — ABNORMAL HIGH (ref 0.0–100.0)

## 2013-10-07 LAB — POCT INR: INR: 2.3

## 2013-10-07 NOTE — Patient Instructions (Addendum)
Your physician recommends that you continue on your current medications as directed. Please refer to the Current Medication list given to you today.  Your physician has requested that you have an echocardiogram. Echocardiography is a painless test that uses sound waves to create images of your heart. It provides your doctor with information about the size and shape of your heart and how well your heart's chambers and valves are working. This procedure takes approximately one hour. There are no restrictions for this procedure.  Your physician recommends that you go to the lab today for a BMET and BNP  Your physician wants you to follow-up in: 6 months with Dr Mallie Snooks will receive a reminder letter in the mail two months in advance. If you don't receive a letter, please call our office to schedule the follow-up appointment.

## 2013-10-07 NOTE — Telephone Encounter (Signed)
New message  Patient was in earlier to see Dr Radford Pax. Her's son is confused b/c D Turner mentioned a ultrasound for (thickness) and nothing was scheduled. Please call and advise.

## 2013-10-07 NOTE — Progress Notes (Signed)
[place], [address], [place]  [address] Phone: [phone] Fax:  [phone]  Date:  10/07/2013   ID:  [name] Gill, DOB [birth year], MRN [id number]  PCP:  Mathews Argyle, MD  Cardiologist:  Fransico Him, MD    History of Present Illness: [name] Gill is a 80 y.o. male with a history of ASCAD, s/p CABG, dyslipidemia, HTN, chronic atrial fibrillation, chronic systemic anticoagulation and tachybrady syndrome s/p PPM who presents for followup.  He recently had a scan done of his neck and chest for a parotid gland mass and the CT showed a spot on his lung and just had a PET scan done and is going to see Dr. Servando Snare.  He is going to have a lung biopsy by Dr. Servando Snare and needs preop clearance.  He denies any chest pain, SOB, DOE, palpitations, dizziness or syncope.  He occasionally has some LE edema.    Wt Readings from Last 3 Encounters:  10/07/13 213 lb (96.616 kg)  10/06/13 214 lb (97.07 kg)  09/20/13 213 lb 12.8 oz (96.979 kg)     Past Medical History  Diagnosis Date  . OSA (obstructive sleep apnea)     04-04-08 NPSG AHI 14.5, 3L/M suppl O2 for nadir 86%  . Atrial fibrillation     coumadin, pacemaker/ CHRONIC  . Diverticulitis 1975  . Hypertension   . COPD (chronic obstructive pulmonary disease)     FEV1 1.35/58% 01-11-10  . Pacemaker -Medtronic     Medtronic  . Ventricular tachycardia, non-sustained     Detected on the device  . Hyperlipidemia     goal LDL less than 70  . PVD (peripheral vascular disease)     w mod left external carotid artery stenosis.   . Obesity   . Ichthyosis   . Anemia   . Renal insufficiency     stage III   . Hyperglycemia     mild glucose 111 on 11/2010  . H/O echocardiogram     nl LVF w severe ASSH and mod cLVH, mildly dilated aorta, milf LAE, mild MR/TR and moderate AVSC  . Incisional hernia     2 upper and 1 lower abdominal incisional hernia.   . Low testosterone     start androgel  . Tachycardia-bradycardia syndrome       s/p PPM  . Chronic anticoagulation   . CHD (coronary heart disease)     s/p CABG with patents grafts at cath 2009  . Aortic root dilatation     Current Outpatient Prescriptions  Medication Sig Dispense Refill  . albuterol (PROVENTIL HFA;VENTOLIN HFA) 108 (90 BASE) MCG/ACT inhaler Inhale 2 puffs into the lungs every 6 (six) hours as needed for wheezing or shortness of breath. Rescue  1 Inhaler  2  . Ascorbic Acid (VITAMIN C) 500 MG tablet Take 500 mg by mouth daily.        [name] atorvastatin (LIPITOR) 80 MG tablet Take 80 mg by mouth daily.       . beta carotene w/minerals (OCUVITE) tablet Take 1 tablet by mouth daily.       . budesonide-formoterol (SYMBICORT) 160-4.5 MCG/ACT inhaler Inhale 2 puffs into the lungs 2 (two) times daily.      . Cholecalciferol (VITAMIN D PO) Take 1 tablet by mouth daily.       [name] diltiazem (CARDIZEM CD) 180 MG 24 hr capsule TAKE 1 CAPSULE BY MOUTH DAILY  30 capsule  6  . fenofibrate 54 MG tablet Take 54 mg  by mouth Daily.       [name] FOLIC ACID PO Take 1 tablet by mouth daily.       . furosemide (LASIX) 80 MG tablet Take 80 mg by mouth daily.      . Multiple Vitamin (MULTIVITAMIN PO) Take 1 tablet by mouth daily.       [name] NITROSTAT 0.4 MG SL tablet Place 0.4 mg under the tongue every 5 (five) minutes as needed for chest pain. Take as directed      . Omega-3 Fatty Acids (FISH OIL) 1000 MG CAPS Take 3,000 mg by mouth daily.       [name] OVER THE COUNTER MEDICATION Take 4-5 oz by mouth every evening. "Diet tonic water"- quinine      . pantoprazole (PROTONIX) 40 MG tablet Take 40 mg by mouth daily.       . potassium chloride (KLOR-CON 10) 10 MEQ CR tablet Take 10 mEq by mouth daily.        . SELENIUM PO Take 1 tablet by mouth daily.       [name] tiotropium (SPIRIVA) 18 MCG inhalation capsule Place 18 mcg into inhaler and inhale daily.      [name] warfarin (COUMADIN) 2.5 MG tablet Take 5 mg by mouth daily at 6 PM. Take as directed       No current facility-administered medications for  this visit.    Allergies:    Allergies  Allergen Reactions  . Testosterone Swelling    In legs    Social History:  The patient  reports that he quit smoking about 15 years ago. His smoking use included Cigarettes. He has a 110 pack-year smoking history. He has never used smokeless tobacco. He reports that he drinks alcohol. He reports that he does not use illicit drugs.   Family History:  The patient's family history includes Aortic stenosis in his mother; Cancer in his father; Tuberculosis in his father.   ROS:  Please see the history of present illness.      All other systems reviewed and negative.   PHYSICAL EXAM: VS:  BP 130/70  Pulse 72  Ht 5\' 6"  (1.676 m)  Wt 213 lb (96.616 kg)  BMI 34.40 kg/m2  SpO2 85% Well nourished, well developed, in no acute distress HEENT: normal Neck: no JVD Cardiac:  normal S1, S2; irregularly irregular; 1/6 SM at RUSB to LLSB Lungs:  clear to auscultation bilaterally, no wheezing, rhonchi or rales Abd: soft, nontender, no hepatomegaly Ext: trace edema Skin: warm and dry Neuro:  CNs 2-12 intact, no focal abnormalities noted      ASSESSMENT AND PLAN:  1. ASCAD with no angina 2. Chronic atrial fibrillation - continue warfarin 3. Dyslipidemia 4. Chronic systemic anticoagulation 5. HTN well controlled - continue Diltiazem 6. Tachybrady syndrome s/p PPM 7. Mildly dilated aortic root 8. Chronic LE edema - continue Lasix 9.  Heart mumur  - 2D echo to assess 10.  Lung mass for biopsy in the future.  He is stable from a cardiac standpoint.  He is able to walk at least 4 blocks without and DOE or chest discomfort.  I think he is low risk from a cardiac standpoint to undergo surgery and general anesthesia.  I instructed him to let us know when he will have surgery so we can guide his warfarin therapy. 11.  Abormal Chest CT with small bilateral pleural effusions and ? Very mild CHF - he appears euvolemic on exam.  I will check a BNp and  if  elevated will adjust diuretics  Followup with me in 6 months  Signed, Fransico Him, MD 10/07/2013 11:02 AM

## 2013-10-07 NOTE — Progress Notes (Signed)
[address]       [name] Gill,[name] Gill [address]             [phone]                    [name] Gill  Referring:Dr Keturah Barre Primary Care: Mathews Argyle, MD  Chief Complaint:    Chief Complaint  Patient presents with  . Lung Lesion    Surgical eval, Chest CT 09/23/13, PET Scan 10/04/13    History of Present Illness:    Ramone Gander 80 y.o. male is seen in the office  Abnormal CT and PET scan. Patient is 60 yo with known pulmonary disease and histery of CAD, afib on coumadin and history of CHF and with pacer for symptotic bradycardia.  Patient noted a left neck mass . Incidental finding on lowest cut of neck ct lead to chest ct and PET with  centrally cavitary right upper lobe pulmonary nodule hypermetabolic suggestive of Stage Ia carcinoma of the lung. Most recent PFT's 2011. Patient has been limited by pulmonary function over past several years, but notes he is better now. He walked 4 min in the office became very winded, sat stayed 93 on ra. He has history of CABG 5 years ago in [place].       Current Activity/ Functional Status:  Patient is independent with mobility/ambulation, transfers, ADL's, IADL's.   Zubrod Score: At the time of surgery this patient's most appropriate activity status/level should be described as: []     0    Normal activity, no symptoms [x]     1    Restricted in physical strenuous activity but ambulatory, able to do out light work []     2    Ambulatory and capable of self care, unable to do work activities, up and about               >50 % of waking hours                              []     3    Only limited self care, in bed greater than 50% of waking hours []     4    Completely disabled, no self care, confined to bed or chair []     5    Moribund   Past Medical History  Diagnosis Date  . OSA (obstructive sleep apnea)     04-04-08 NPSG AHI 14.5, 3L/M suppl O2 for  nadir 86%  . Atrial fibrillation     coumadin, pacemaker/ CHRONIC  . Diverticulitis 1975  . Hypertension   . COPD (chronic obstructive pulmonary disease)     FEV1 1.35/58% 01-11-10  . Pacemaker -Medtronic     Medtronic  . Ventricular tachycardia, non-sustained     Detected on the device  . Hyperlipidemia     goal LDL less than 70  . PVD (peripheral vascular disease)     w mod left external carotid artery stenosis.   . Obesity   . Ichthyosis   . Anemia   . Renal insufficiency     stage III   . Hyperglycemia     mild glucose 111 on 11/2010  . H/O echocardiogram     nl LVF w severe ASSH and mod cLVH, mildly dilated aorta, milf LAE, mild MR/TR and moderate AVSC  . Incisional hernia  2 upper and 1 lower abdominal incisional hernia.   . Low testosterone     start androgel  . Tachycardia-bradycardia syndrome     s/p PPM  . Chronic anticoagulation   . CHD (coronary heart disease)     s/p CABG with patents grafts at cath 2009  . Aortic root dilatation     Past Surgical History  Procedure Laterality Date  . Broken nose    . Pacemaker insertion  02/2008    medtronic  . Coronary artery bypass graft  [id number]    coronary disease s/p CABG 2004 and Cath 2009  . Cardiac catheterization  2009    Family History  Problem Relation Age of Onset  . Tuberculosis Father     arrested  . Cancer Father   . Aortic stenosis Mother     ASCVD    History   Social History  . Marital Status: Married    Spouse Name: N/A    Number of Children: N/A  . Years of Education: N/A   Occupational History  . retired     [employer]   Social History Main Topics  . Smoking status: Former Smoker -- 2.00 packs/day for 55 years    Types: Cigarettes    Quit date: 07/21/1998  . Smokeless tobacco: Never Used  . Alcohol Use: Yes     Comment: yes "rarely"  . Drug Use: No  . Sexual Activity: Not on file      History  Smoking status  . Former Smoker -- 2.00 packs/day for 55 years    . Types: Cigarettes  . Quit date: 07/21/1998  Smokeless tobacco  . Never Used    History  Alcohol Use  . Yes    Comment: yes "rarely"     Allergies  Allergen Reactions  . Testosterone Swelling    In legs    Current Outpatient Prescriptions  Medication Sig Dispense Refill  . albuterol (PROVENTIL HFA;VENTOLIN HFA) 108 (90 BASE) MCG/ACT inhaler Inhale 2 puffs into the lungs every 6 (six) hours as needed for wheezing or shortness of breath. Rescue  1 Inhaler  2  . Ascorbic Acid (VITAMIN C) 500 MG tablet Take 500 mg by mouth daily.        [name] atorvastatin (LIPITOR) 80 MG tablet Take 80 mg by mouth daily.       . beta carotene w/minerals (OCUVITE) tablet Take 1 tablet by mouth daily.       . budesonide-formoterol (SYMBICORT) 160-4.5 MCG/ACT inhaler Inhale 2 puffs into the lungs 2 (two) times daily.      . Cholecalciferol (VITAMIN D PO) Take 1 tablet by mouth daily.       [name] diltiazem (CARDIZEM CD) 180 MG 24 hr capsule TAKE 1 CAPSULE BY MOUTH DAILY  30 capsule  6  . fenofibrate 54 MG tablet Take 54 mg by mouth Daily.       [name] FOLIC ACID PO Take 1 tablet by mouth daily.       . Multiple Vitamin (MULTIVITAMIN PO) Take 1 tablet by mouth daily.       [name] NITROSTAT 0.4 MG SL tablet Place 0.4 mg under the tongue every 5 (five) minutes as needed for chest pain. Take as directed      . Omega-3 Fatty Acids (FISH OIL) 1000 MG CAPS Take 3,000 mg by mouth daily.       [name] OVER THE COUNTER MEDICATION Take 4-5 oz by mouth every evening. "Diet tonic water"- quinine      .  pantoprazole (PROTONIX) 40 MG tablet Take 40 mg by mouth daily.       . potassium chloride (KLOR-CON 10) 10 MEQ CR tablet Take 10 mEq by mouth daily.        . SELENIUM PO Take 1 tablet by mouth daily.       [name] tiotropium (SPIRIVA) 18 MCG inhalation capsule Place 18 mcg into inhaler and inhale daily.      [name] warfarin (COUMADIN) 2.5 MG tablet Take 5 mg by mouth daily at 6 PM. Take as directed      . furosemide (LASIX) 80 MG tablet 80 in the  am and 40  In the pm       No current facility-administered medications for this visit.     Review of Systems:     Cardiac Review of Systems: Y or N  Chest Pain [ n   ]  Resting SOB [ y  ] Exertional SOB  Blue.Reese  ]  Orthopnea [ y ]   Pedal Edema [ y  ]    Palpitations [ y ] Syncope  [ n ]   Presyncope [ n  ]  General Review of Systems: [Y] = yes [  ]=no Constitional: recent weight change Blue.Reese  ];  Wt loss over the last 3 months [ 257 to 189 now 32  ] anorexia [  ]; fatigue Blue.Reese  ]; nausea [  ]; night sweats [  ]; fever [  ]; or chills [  ];          Dental: poor dentition[ y ]; Last Dentist visit: 3.1.2015  Eye : blurred vision [  ]; diplopia [   ]; vision changes [  ];  Amaurosis fugax[  ]; Resp: cough Blue.Reese  ];  wheezing[y  ];  hemoptysis[ n ]; shortness of breath[ y ]; paroxysmal nocturnal dyspnea[ y ]; dyspnea on exertion[y  ]; or orthopnea[  ];  GI:  gallstones[  ], vomiting[  ];  dysphagia[  ]; melena[  ];  hematochezia [  ]; heartburn[  ];   Hx of  Colonoscopy[ y ]; GU: kidney stones [  ]; hematuria[  ];   dysuria [  ];  nocturia[  ];  history of     obstruction [n  ]; urinary frequency [  ]             Skin: rash, swelling[  ];, hair loss[  ];  peripheral edema[  ];  or itching[  ]; Musculosketetal: myalgias[  ];  joint swelling[  ];  joint erythema[  ];  joint pain[  ];  back pain[  ];  Heme/Lymph: bruising[  ];  bleeding[  ];  anemia[  ];  Neuro: TIA[  ];  headaches[  ];  stroke[n  ];  vertigo[  ];  seizures[  ];   paresthesias[y  ];  difficulty walking[ y ];  Psych:depression[  ]; anxiety[  ];  Endocrine: diabetes[ n ];  thyroid dysfunction[n  ];  Immunizations: Flu up to date [ y ]; Pneumococcal up to date Blue.Reese  ];  Other:  Physical Exam: BP 124/74  Pulse 80  Resp 20  Ht 5\' 6"  (1.676 m)  Wt 213 lb (96.616 kg)  BMI 34.40 kg/m2  SpO2 98%  PHYSICAL EXAMINATION:  General appearance: alert, cooperative, appears older than stated age and moderately obese Neurologic: intact Heart:  irregularly irregular rhythm Lungs: diminished breath sounds bilaterally Abdomen: soft, non-tender; bowel sounds normal; no masses,  no  organomegaly and palable hernia midline Extremities: extremities normal, atraumatic, no cyanosis or edema and 1+ dp and PT pulses bilaterial mild edema no carotid bruits, needle stick left neck where he just had neck mass bx   Diagnostic Studies & Laboratory data:     Recent Radiology Findings:   Ct Chest W Contrast  09/23/2013   CLINICAL DATA:  13 mm right middle lobe lung nodule on neck CT. Ex-smoker. History of skin cancer in 2010. Prior CABG. COPD.  EXAM: CT CHEST WITH CONTRAST  TECHNIQUE: Multidetector CT imaging of the chest was performed during intravenous contrast administration.  CONTRAST:  69mL OMNIPAQUE IOHEXOL 300 MG/ML  SOLN  COMPARISON:  DG CHEST 2 VIEW dated 11/24/2012; CT NECK W/CM dated 09/07/2013  FINDINGS: Lungs/Pleura: Mild to moderate centrilobular emphysema. Corresponding to the abnormality on the prior exam is a posterior right upper lobe 1.4 x 1.4 cm nodule on image 24/ series 3. Minimal central cavitation.  Thickening of the right minor fissure on image 32/series 3.  Linear opacity at the left lung apex could represent an area of scarring. This is somewhat more nodular medially, measuring 7 mm on transverse image 12 and 11 mm on coronal image 83.  Small bilateral pleural effusions.  Heart/Mediastinum: Pacer with leads at right atrium and right ventricle. Right-sided thyroid nodule measures 1.9 cm.  Aortic and branch vessel atherosclerosis. Cardiomegaly and prior median sternotomy. Pulmonary artery enlargement. No pericardial effusion.  A right low paratracheal/precarinal node measures 1.5 x 2.4 cm on image 23/series 2. 1.3 cm low right paratracheal node on image 17.  Right infrahilar adenopathy at 1.6 cm on image 33.  Upper Abdomen: Fatty atrophy throughout the pancreas, especially the head and uncinate process. Mild renal atrophy. Normal adrenal  glands. Gallbladder fundal calcifications, likely due to a fundal peripherally calcified stone. Image 66. A left paracentral ventral abdominal wall hernia which contains nonobstructed transverse colon.  Bones/Musculoskeletal:  Advanced thoracic spondylosis.  IMPRESSION: 1. Centrally cavitary right upper lobe pulmonary nodule. Suspicious for primary bronchogenic carcinoma. This is superimposed upon mild to moderate centrilobular emphysema. Consider further evaluation with PET. 2. Probable scarring at the left apex. This is somewhat nodular more medially. This could be re-evaluated at followup PET. 3. Cardiomegaly with small bilateral pleural effusions, suggesting a component of congestive heart failure/fluid overload. 4. Thoracic adenopathy. This is nonspecific in the setting of congestive heart failure. Could be secondary. However, metastatic disease cannot be excluded. 5. Pulmonary artery enlargement suggests pulmonary arterial hypertension. 6. Cholelithiasis. 7. Incompletely imaged transverse colon containing ventral abdominal wall hernia. 8. Indeterminate right-sided thyroid nodule. This could be further evaluated with thyroid ultrasound. However, given patient age and other comorbidities, of questionable significance.   Electronically Signed   By: Abigail Miyamoto M.D.   On: 09/23/2013 10:17   Nm Pet Image Initial (pi) Skull Base To Thigh  10/04/2013   CLINICAL DATA:  Initial treatment strategy for cavitary right upper lobe lung nodule on prior chest CT.  EXAM: NUCLEAR MEDICINE PET SKULL BASE TO THIGH  TECHNIQUE: 12.5 mCi F-18 FDG was injected intravenously. Full-ring PET imaging was performed from the skull base to thigh after the radiotracer. CT data was obtained and used for attenuation correction and anatomic localization.  FASTING BLOOD GLUCOSE:  Value: 97 mg/dl  COMPARISON:  CT CHEST W/CM dated 09/23/2013; CT NECK W/CM dated 09/07/2013; CT PELVIS W/O CM dated 10/10/2008  FINDINGS: NECK  Two hypermetabolic  nodules within the left parotid gland. The more superior and medial measures 1.2  cm and a S.U.V. max of 21.7 on image 21.  The more inferior and lateral measures 2.0 cm and a S.U.V. max of 23.0. No hypermetabolic cervical lymph nodes.  Hypermetabolism within the left side of the vallecula. This corresponds to relative underdistention in this area. This measures a S.U.V. max of 5.1 on image 33/series 4.  CHEST  Hypermetabolism which corresponds to the posterior right upper lobe cavitary nodule. This measures 1.3 cm and a S.U.V. max of 12.2 on image 28. Similar in size to on the prior exam, given differences in measurement technique.  The thoracic nodes detailed on prior exam are not hypermetabolic and are decreased in size. For example, a 1.0 cm right paratracheal node on image 63 measured 1.3 cm on the prior exam.  Right ventricular hypermetabolism which is nonspecific but may relate to a component of elevated right heart pressures/right heart failure.  ABDOMEN/PELVIS  No areas of abnormal hypermetabolism.  SKELETON  No abnormal marrow activity.  CT IMAGES PERFORMED FOR ATTENUATION CORRECTION  Neck findings deferred to recent diagnostic CT. No superimposed acute process.  Chest findings also deferred to recent diagnostic CT. Cardiomegaly with prior median sternotomy. The small bilateral pleural effusions have resolved. No hypermetabolism to correspond to the presumed scarring within the left upper lobe.  Bilateral renal atrophy. Subcentimeter lower pole left renal lesion which is likely a cyst. Infrarenal non aneurysmal aortic dilatation at 2.9 cm. This is chronic. Normal adrenal glands. Borderline aneurysmal dilatation of the right common iliac and ectasia of the left common iliac artery. 1.5 cm on the right and 1.4 cm on the left  Gallstones, including a peripherally calcified stone at the fundus. Transverse colonic containing left para midline ventral abdominal wall hernia. There is a fat containing central  abdominal wall hernia. More inferior upper pelvic small bowel containing ventral hernia. No obstruction. Moderate prostatomegaly. Right greater than left fat containing inguinal hernias. Small bilateral hydroceles.  IMPRESSION: 1. Hypermetabolism corresponding to the cavitary posterior right upper lobe lung nodule. Most consistent with primary bronchogenic carcinoma. The thoracic nodes are not hypermetabolic and are decreased in size. There were likely related to a congestive heart failure exacerbation. Therefore, presuming non-small-cell histology, most consistent with T1aN0M0 or stage IA. 2. Hypermetabolic left parotid nodules. 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 Please see neck CT report of 09/07/2013. 3. Hypermetabolism at the left side of the vallecula. Favored to be physiologic and related to secretions in this area. This area is underdistended. This could be re-evaluated on follow-up PET or if there is a clinical concern of mucosal lesion, direct visualization performed. 4. Incidental findings, including common iliac artery dilatation, gallstones, and ventral abdominal wall hernias.   Electronically Signed   By: Abigail Miyamoto M.D.   On: 10/04/2013 16:31      Recent Lab Findings: Lab Results  Component Value Date   WBC 8.5 05/27/2013   HGB 12.4* 05/27/2013   HCT 37.3* 05/27/2013   PLT 377.0 05/27/2013   GLUCOSE 102* 10/07/2013   ALT 14 11/18/2011   AST 22 11/18/2011   NA 140 10/07/2013   K 4.5 10/07/2013   CL 102 10/07/2013   CREATININE 1.5 10/07/2013   BUN 30* 10/07/2013   CO2 31 10/07/2013   INR 2.3 10/07/2013    PFT's PFT 01/11/10- moderate obstructive airways disease, insignificant response to bronchodilator.  FVC 2.28/ 62%, FEV1 1.35/ 58%, FEV1/FVC 0.59, TLC 90%, DLCO 57%   Assessment / Plan:   1, Hypermetabolic  corresponding to the cavitary posterior  right upper lobe lung nodule 1.3 cm suggestive of Stage Ia carcinoma of the lung 2, At least moderate COPD, no current PFT's  . 3, primary parotid neoplasm- BX results pending 4, History of CABG with CHF echo and BNP pending, seen by cardiology this am  Will obtain full evaluation ordered by cardiology ans current PFT's then make decision proceeding with wedge resection of lung lesion vs stereotatic radiotherapy. Patient not candidate for lobectomy with limited pulmonary reserve and cardiac / chf symptoms.  Will see next week after PFT completed.     I spent 55 minutes counseling the patient face to face. The total time spent in the appointment was 80 minutes.  Grace Isaac MD      [address] [place] [address] Office [phone]   Beeper [phone]  10/11/2013 9:44 PM

## 2013-10-07 NOTE — Telephone Encounter (Signed)
Pt is aware.  

## 2013-10-10 ENCOUNTER — Other Ambulatory Visit: Payer: Self-pay | Admitting: *Deleted

## 2013-10-10 DIAGNOSIS — R911 Solitary pulmonary nodule: Secondary | ICD-10-CM

## 2013-10-11 ENCOUNTER — Other Ambulatory Visit: Payer: Self-pay | Admitting: General Surgery

## 2013-10-11 ENCOUNTER — Encounter: Payer: Self-pay | Admitting: Cardiology

## 2013-10-11 DIAGNOSIS — Z79899 Other long term (current) drug therapy: Secondary | ICD-10-CM

## 2013-10-11 DIAGNOSIS — R0602 Shortness of breath: Secondary | ICD-10-CM

## 2013-10-11 MED ORDER — FUROSEMIDE 80 MG PO TABS: ORAL_TABLET | ORAL | Status: DC

## 2013-10-12 ENCOUNTER — Ambulatory Visit (HOSPITAL_COMMUNITY)
Admission: RE | Admit: 2013-10-12 | Discharge: 2013-10-12 | Disposition: A | Discharge status: Home / Self Care | Payer: Medicare Other | Source: Ambulatory Visit | Attending: Cardiovascular Disease | Admitting: Cardiovascular Disease

## 2013-10-12 ENCOUNTER — Ambulatory Visit (HOSPITAL_COMMUNITY)
Admission: RE | Admit: 2013-10-12 | Discharge: 2013-10-12 | Disposition: A | Discharge status: Home / Self Care | Payer: Medicare Other | Source: Ambulatory Visit | Attending: Cardiothoracic Surgery | Admitting: Cardiothoracic Surgery

## 2013-10-12 DIAGNOSIS — R911 Solitary pulmonary nodule: Secondary | ICD-10-CM | POA: Insufficient documentation

## 2013-10-12 DIAGNOSIS — R011 Cardiac murmur, unspecified: Secondary | ICD-10-CM | POA: Insufficient documentation

## 2013-10-12 DIAGNOSIS — I059 Rheumatic mitral valve disease, unspecified: Secondary | ICD-10-CM

## 2013-10-12 LAB — PULMONARY FUNCTION TEST
DL/VA % pred: 69 %
DL/VA: 2.97 ml/min/mmHg/L
DLCO cor % pred: 46 %
DLCO cor: 12.51 ml/min/mmHg
DLCO unc % pred: 46 %
DLCO unc: 12.51 ml/min/mmHg
FEF 25-75 Post: 1.18 L/sec
FEF 25-75 Pre: 0.83 L/sec
FEF2575-%Change-Post: 42 %
FEF2575-%Pred-Post: 88 %
FEF2575-%Pred-Pre: 62 %
FEV1-%Change-Post: 8 %
FEV1-%Pred-Post: 89 %
FEV1-%Pred-Pre: 81 %
FEV1-Post: 1.91 L
FEV1-Pre: 1.75 L
FEV1FVC-%Change-Post: 4 %
FEV1FVC-%Pred-Pre: 94 %
FEV6-%Change-Post: 4 %
FEV6-%Pred-Post: 94 %
FEV6-%Pred-Pre: 90 %
FEV6-Post: 2.7 L
FEV6-Pre: 2.58 L
FEV6FVC-%Change-Post: 0 %
FEV6FVC-%Pred-Post: 107 %
FEV6FVC-%Pred-Pre: 107 %
FVC-%Change-Post: 3 %
FVC-%Pred-Post: 88 %
FVC-%Pred-Pre: 84 %
FVC-Post: 2.76 L
FVC-Pre: 2.65 L
Post FEV1/FVC ratio: 69 %
Post FEV6/FVC ratio: 98 %
Pre FEV1/FVC ratio: 66 %
Pre FEV6/FVC Ratio: 97 %
RV % pred: 98 %
RV: 2.51 L
TLC % pred: 84 %
TLC: 5.3 L

## 2013-10-12 MED ORDER — ALBUTEROL SULFATE (2.5 MG/3ML) 0.083% IN NEBU
2.5000 mg | INHALATION_SOLUTION | Freq: Once | RESPIRATORY_TRACT | Status: AC
  Administered 2013-10-12: 2.5 mg via RESPIRATORY_TRACT

## 2013-10-12 NOTE — Progress Notes (Signed)
2D Echocardiogram has been performed. 

## 2013-10-14 ENCOUNTER — Ambulatory Visit: Payer: Medicare Other | Admitting: Cardiothoracic Surgery

## 2013-10-18 ENCOUNTER — Other Ambulatory Visit (INDEPENDENT_AMBULATORY_CARE_PROVIDER_SITE_OTHER): Payer: Medicare Other

## 2013-10-18 ENCOUNTER — Ambulatory Visit: Payer: Medicare Other | Admitting: Cardiothoracic Surgery

## 2013-10-18 ENCOUNTER — Telehealth: Payer: Self-pay | Admitting: Internal Medicine

## 2013-10-18 DIAGNOSIS — R0602 Shortness of breath: Secondary | ICD-10-CM

## 2013-10-18 DIAGNOSIS — R918 Other nonspecific abnormal finding of lung field: Secondary | ICD-10-CM

## 2013-10-18 LAB — BASIC METABOLIC PANEL
BUN: 43 mg/dL — AB (ref 6–23)
CO2: 31 mEq/L (ref 19–32)
Calcium: 9.6 mg/dL (ref 8.4–10.5)
Chloride: 101 mEq/L (ref 96–112)
Creatinine, Ser: 1.5 mg/dL (ref 0.4–1.5)
GFR: 47.93 mL/min — ABNORMAL LOW (ref >60.00)
Glucose, Bld: 112 mg/dL — ABNORMAL HIGH (ref 70–99)
POTASSIUM: 3.9 meq/L (ref 3.5–5.1)
Sodium: 141 mEq/L (ref 135–145)

## 2013-10-18 LAB — BRAIN NATRIURETIC PEPTIDE: PRO B NATRI PEPTIDE: 327 pg/mL — AB (ref 0.0–100.0)

## 2013-10-18 NOTE — Telephone Encounter (Signed)
Ok to refer to Radiation Oncology for Radiation Oncology consult for lung mass.

## 2013-10-18 NOTE — Telephone Encounter (Signed)
Called spoke with [name] Gill. He reports this may be the 2nd time pt ? Surgery getting bumped by Dr. Jobie Quaker. Pt upset bc he is afraid the cancer is growing and wants to start [place]. [name] Gill is requesting a referral to radiation oncology to speak with them since they were told radiation was an option. Wants a referral to Dr. Annamaria Boots to he thinks pt should see. Please advise thanks

## 2013-10-18 NOTE — Telephone Encounter (Signed)
[email or web address] x1 for [employer]

## 2013-10-18 NOTE — Telephone Encounter (Signed)
Spoke with [name] Gill and notified of recs per CDY  He verbalized understanding  Nothing further needed Order was sent to [facility]

## 2013-10-18 NOTE — Telephone Encounter (Signed)
[name] Gill returned triage's call.  [name]

## 2013-10-19 ENCOUNTER — Ambulatory Visit (INDEPENDENT_AMBULATORY_CARE_PROVIDER_SITE_OTHER): Payer: Medicare Other | Admitting: Cardiothoracic Surgery

## 2013-10-19 ENCOUNTER — Telehealth: Payer: Self-pay | Admitting: Cardiology

## 2013-10-19 ENCOUNTER — Telehealth: Payer: Self-pay | Admitting: Internal Medicine

## 2013-10-19 ENCOUNTER — Encounter: Payer: Self-pay | Admitting: Cardiothoracic Surgery

## 2013-10-19 ENCOUNTER — Other Ambulatory Visit: Payer: Self-pay | Admitting: *Deleted

## 2013-10-19 VITALS — BP 122/75 | HR 76 | Resp 16 | Ht 66.0 in | Wt 213.0 lb

## 2013-10-19 DIAGNOSIS — Z951 Presence of aortocoronary bypass graft: Secondary | ICD-10-CM

## 2013-10-19 DIAGNOSIS — J449 Chronic obstructive pulmonary disease, unspecified: Secondary | ICD-10-CM

## 2013-10-19 DIAGNOSIS — R911 Solitary pulmonary nodule: Secondary | ICD-10-CM

## 2013-10-19 DIAGNOSIS — D381 Neoplasm of uncertain behavior of trachea, bronchus and lung: Secondary | ICD-10-CM

## 2013-10-19 NOTE — Telephone Encounter (Signed)
FYI

## 2013-10-19 NOTE — Telephone Encounter (Signed)
New message     FYI Pt having lung resection for lung cancer.  Surgery is 10-25-13 with Dr Servando Snare. Stopping coumadin thursday

## 2013-10-19 NOTE — Patient Instructions (Signed)
STOP COUMADIN Thursday 4/2/ 2015    Video-Assisted Thoracic Surgery Video-assisted thoracic surgery (VATS) is a procedure that allows your caregiver to look inside your chest and do some minor operations. VATS is commonly done to:  Study or diagnose problems in the chest.  Take a tissue sample (biopsy).   Put medications directly into the chest.   Remove collections of fluid, pus, or blood.  Remove tumors. VATS is done using thoracoscopy. Thoracoscopy is a procedure in which a thin, lighted tube (thoracoscope) is put through a small surgical cut (incision)inyour chest wall. The thoracoscope used for VATS has a video camera on it. It allows your caregiver to see the inside of your chest on a television screen. LET YOUR CAREGIVER KNOW ABOUT:   Any allergies you have.  All medications you are taking, including vitamins, herbs, eyedrops, and over-the-counter medications and creams.  Use of steroids (by mouth or creams).   Previous problems you or members of your family have had with the use of anesthetics.  Any blood disorders you have had.  Any blood thinners, like warfarin, that you take.  Previous surgery.   Any history of heart problems.  Other health problems you RISKS AND COMPLICATIONS  Severe bleeding (hemorrhage).  Injury to nerves or other structures in the chest.   Lung infection.  The chest may need to be opened with a large incision (thoracotomy) if the procedure cannot be completed safely with the thoracoscope. BEFORE THE PROCEDURE   Tests such as blood tests, urine tests, chest X-rays, and electrocardiography may be done.  Follow your caregiver's instructions if you are taking dietary supplements or medications. Your caregiver may tell you to stop taking them or to reduce your dosage.  Do not take new dietary supplements or medications within 1 week of your procedure unless your caregiver approves them.  Do not eat or drink within 8 hours of your  procedure or as directed by your caregiver.  Do not smoke for as long as possible before your procedure. If possible, stop smoking 3 6 weeks before the procedure. PROCEDURE   You will be given a medication that makes you sleep (general anesthetic) through a mask, through an intravenous (IV) access tube, or through both. This medication will prevent you from being alert and feeling pain during the procedure.Once you are asleep, abreathing tube or mask may be used to help you breathe.  A video thoracoscope will be placed in a very small incision (less than an inch wide), so that your caregiver can see into your chest. The picture from inside the chest will be displayed on a television screen.  Other surgical tools will be inserted through the remaining incisions.  One of your lungs will be deflated as the surgery is performed. Deflating a lung makes it easier for your caregiver to see the area. The lung will be inflated at the end of the procedure.  The tools will be removed when your caregiver has finished performing the examination or procedure.  A chest tube will be inserted through an incision to drain the wound. The remaining incisions will be closed with stitches or staples. AFTER THE PROCEDURE  Chest tubes are watched closely for signs of fluid or air buildup in the lungs. Your caregiver will most likely remove them prior to discharge.  You will need to stay in the hospital for 1 5 days. The average time in the hospital with VATS surgery is usually several days less than with standard open surgery.  You may receive medications to help with pain.  It is normal to feel sore for about 2 weeks after the procedure. Document Released: 11/01/2012 Document Reviewed: 11/01/2012 [facility] Patient Information 2014 [place], [place]. Video-Assisted Thoracic Surgery Care After Refer to this sheet in the next few weeks. These instructions provide you with information on caring for yourself after  your procedure. Your caregiver may also give you more specific instructions. Your procedure has been planned according to current medical practices, but problems sometimes occur. Call your caregiver if you have any problems or questions after your procedure. HOME CARE INSTRUCTIONS   Only take over-the-counter or prescription medications as directed.  Only take pain medications (narcotics) as directed.  Do not drive until your caregiver approves. Driving while taking narcotics or soon after surgery can be dangerous, so discuss the specific timing with your caregiver.  Avoid activities that use your chest muscles, such as lifting heavy objects, for at least 3 4 weeks.   Take deep breaths to expand the lungs and to protect against pneumonia.  Do breathing exercises as directed by your caregiver. If you were given an incentive spirometer to help with breathing, use it as directed.  You may resume a normal diet and activities when you feel you are able to or as directed.  Do not take a bath until your caregiver says it is OK. Use the shower instead.   Keep the bandage (dressing) covering the area where the chest tube was inserted (incision site) dry for 48 hours. After 48 hours, remove the dressing unless there is new drainage.  Remove dressings as directed by your caregiver.  Change dressings if necessary or as directed.  Keep all follow-up appointments. It is important for you to see your caregiver after surgery to discuss appropriate follow-up care and surveillance, if it is necessary. SEEK MEDICAL CARE:  You feel excessive or increasing pain at an incision site.  You notice bleeding, skin irritation, drainage, swelling, or redness at an incision site.  There is a bad smell coming from an incision or dressing.  It feels like your heart is fluttering or beating rapidly.  Your pain medication does not relieve your pain. SEEK IMMEDIATE MEDICAL CARE IF:   You have a fever.   You  have chest pain.  You have a rash.  You have shortness of breath.  You have trouble breathing.   You feel weak, lightheaded, dizzy, or faint.  MAKE SURE YOU:   Understand these instructions.   Will watch your condition.   Will get help right away if you are not doing well or get worse. Document Released: 11/01/2012 Document Reviewed: 11/01/2012 [facility] Patient Information 2014 [place], [place].

## 2013-10-19 NOTE — Progress Notes (Signed)
[address]       ,[place] [address]             [phone]       Rambo L[facility] [place] Medical Record #[id number] Date of Birth: 12-Jul-1926  Referring: Dr Keturah Barre Primary Care: Mathews Argyle, MD  Chief Complaint:    Chief Complaint  Patient presents with  . Lung Lesion    f/u after PFT'S and ECHO    History of Present Illness:    [name] Gill 80 y.o. male is seen in the office for abnormal CT and PET scan. Patient has  known pulmonary disease and histery of CAD, afib on coumadin and history of CHF and with pacer for symptotic bradycardia. He has history of CABG 5 years ago in [place]  Patient noted a left neck mass . Incidental finding on lowest cut of neck ct lead to a chest ct and PET scan  with  centrally cavitary right upper lobe pulmonary nodule hypermetabolic suggestive of Stage Ia carcinoma of the lung. . Patient has been limited by pulmonary function over past several years, but notes he is better now. He walked 4 min in the office became very winded, sat stayed 93 on ra.   Patient returns today with his family to discuss treatment options of presumed nonsmall cell carcinoma of the right lung. Since last seen he has been seen by cardiology and had current PFT's done.     Current Activity/ Functional Status:  Patient is independent with mobility/ambulation, transfers, ADL's, IADL's.   Zubrod Score: At the time of surgery this patient's most appropriate activity status/level should be described as: []     0    Normal activity, no symptoms [x]     1    Restricted in physical strenuous activity but ambulatory, able to do out light work []     2    Ambulatory and capable of self care, unable to do work activities, up and about               >50 % of waking hours                              []     3    Only limited self care, in bed greater than 50% of waking hours []     4    Completely disabled, no self care, confined to bed or chair []      5    Moribund   Past Medical History  Diagnosis Date  . OSA (obstructive sleep apnea)     04-04-08 NPSG AHI 14.5, 3L/M suppl O2 for nadir 86%  . Atrial fibrillation     coumadin, pacemaker/ CHRONIC  . Diverticulitis 1975  . Hypertension   . COPD (chronic obstructive pulmonary disease)     FEV1 1.35/58% 01-11-10  . Pacemaker -Medtronic     Medtronic  . Ventricular tachycardia, non-sustained     Detected on the device  . Hyperlipidemia     goal LDL less than 70  . PVD (peripheral vascular disease)     w mod left external carotid artery stenosis.   . Obesity   . Ichthyosis   . Anemia   . Renal insufficiency     stage III   . Hyperglycemia     mild glucose 111 on 11/2010  . H/O echocardiogram     nl LVF w severe ASSH and  mod cLVH, mildly dilated aorta, milf LAE, mild MR/TR and moderate AVSC  . Incisional hernia     2 upper and 1 lower abdominal incisional hernia.   . Low testosterone     start androgel  . Tachycardia-bradycardia syndrome     s/p PPM  . Chronic anticoagulation   . CHD (coronary heart disease)     s/p CABG with patents grafts at cath 2009  . Aortic root dilatation     Past Surgical History  Procedure Laterality Date  . Broken nose    . Pacemaker insertion  02/2008    medtronic  . Coronary artery bypass graft  9562,1308    coronary disease s/p CABG 2004 and Cath 2009  . Cardiac catheterization  2009    Family History  Problem Relation Age of Onset  . Tuberculosis Father     arrested  . Cancer Father   . Aortic stenosis Mother     ASCVD    History   Social History  . Marital Status: Married    Spouse Name: N/A    Number of Children: N/A  . Years of Education: N/A   Occupational History  . retired     [employer]   Social History Main Topics  . Smoking status: Former Smoker -- 2.00 packs/day for 55 years    Types: Cigarettes    Quit date: 07/21/1998  . Smokeless tobacco: Never Used  . Alcohol Use: Yes     Comment: yes "rarely"   . Drug Use: No  . Sexual Activity: Not on file      History  Smoking status  . Former Smoker -- 2.00 packs/day for 55 years  . Types: Cigarettes  . Quit date: 07/21/1998  Smokeless tobacco  . Never Used    History  Alcohol Use  . Yes    Comment: yes "rarely"     Allergies  Allergen Reactions  . Testosterone Swelling    In legs    Current Outpatient Prescriptions  Medication Sig Dispense Refill  . Ascorbic Acid (VITAMIN C) 500 MG tablet Take 500 mg by mouth daily.        [name] atorvastatin (LIPITOR) 80 MG tablet Take 80 mg by mouth daily.       . beta carotene w/minerals (OCUVITE) tablet Take 1 tablet by mouth daily.       . budesonide-formoterol (SYMBICORT) 160-4.5 MCG/ACT inhaler Inhale 2 puffs into the lungs 2 (two) times daily.      . Cholecalciferol (VITAMIN D PO) Take 1 tablet by mouth daily.       [name] diltiazem (CARDIZEM CD) 180 MG 24 hr capsule TAKE 1 CAPSULE BY MOUTH DAILY  30 capsule  6  . fenofibrate 54 MG tablet Take 54 mg by mouth Daily.       [name] FOLIC ACID PO Take 1 tablet by mouth daily.       . furosemide (LASIX) 80 MG tablet 80 in the am and 40  In the pm      . Multiple Vitamin (MULTIVITAMIN PO) Take 1 tablet by mouth daily.       [name] NITROSTAT 0.4 MG SL tablet Place 0.4 mg under the tongue every 5 (five) minutes as needed for chest pain. Take as directed      . Omega-3 Fatty Acids (FISH OIL) 1000 MG CAPS Take 3,000 mg by mouth daily.       [name] OVER THE COUNTER MEDICATION Take 4-5 oz by mouth every evening. "Diet  tonic water"- quinine      . pantoprazole (PROTONIX) 40 MG tablet Take 40 mg by mouth daily.       . potassium chloride (KLOR-CON 10) 10 MEQ CR tablet Take 10 mEq by mouth daily.        . SELENIUM PO Take 1 tablet by mouth daily.       [name] tiotropium (SPIRIVA) 18 MCG inhalation capsule Place 18 mcg into inhaler and inhale daily.      [name] warfarin (COUMADIN) 2.5 MG tablet Take 5 mg by mouth daily at 6 PM. Take as directed      . albuterol (PROVENTIL  HFA;VENTOLIN HFA) 108 (90 BASE) MCG/ACT inhaler Inhale 2 puffs into the lungs every 6 (six) hours as needed for wheezing or shortness of breath. Rescue  1 Inhaler  2   No current facility-administered medications for this visit.     Review of Systems:     Cardiac Review of Systems: Y or N  Chest Pain [ n   ]  Resting SOB [ y  ] Exertional SOB  Blue.Reese  ]  Orthopnea [ y ]   Pedal Edema [ y  ]    Palpitations [ y ] Syncope  [ n ]   Presyncope [ n  ]  General Review of Systems: [Y] = yes [  ]=no Constitional: recent weight change Blue.Reese  ];  Wt loss over the last 3 months [ 257 to 189 now 65  ] anorexia [  ]; fatigue Blue.Reese  ]; nausea [  ]; night sweats [  ]; fever [  ]; or chills [  ];          Dental: poor dentition[ y ]; Last Dentist visit: 3.1.2015  Eye : blurred vision [  ]; diplopia [   ]; vision changes [  ];  Amaurosis fugax[  ]; Resp: cough Blue.Reese  ];  wheezing[y  ];  hemoptysis[ n ]; shortness of breath[ y ]; paroxysmal nocturnal dyspnea[ y ]; dyspnea on exertion[y  ]; or orthopnea[  ];  GI:  gallstones[  ], vomiting[  ];  dysphagia[  ]; melena[  ];  hematochezia [  ]; heartburn[  ];   Hx of  Colonoscopy[ y ]; GU: kidney stones [  ]; hematuria[  ];   dysuria [  ];  nocturia[  ];  history of     obstruction [n  ]; urinary frequency [  ]             Skin: rash, swelling[  ];, hair loss[  ];  peripheral edema[  ];  or itching[  ]; Musculosketetal: myalgias[  ];  joint swelling[  ];  joint erythema[  ];  joint pain[  ];  back pain[  ];  Heme/Lymph: bruising[  ];  bleeding[  ];  anemia[  ];  Neuro: TIA[  ];  headaches[  ];  stroke[n  ];  vertigo[  ];  seizures[  ];   paresthesias[y  ];  difficulty walking[ y ];  Psych:depression[  ]; anxiety[  ];  Endocrine: diabetes[ n ];  thyroid dysfunction[n  ];  Immunizations: Flu up to date [ y ]; Pneumococcal up to date Blue.Reese  ];  Other:  Physical Exam: BP 122/75  Pulse 76  Resp 16  Ht 5\' 6"  (1.676 m)  Wt 213 lb (96.616 kg)  BMI 34.40 kg/m2  SpO2  97%  PHYSICAL EXAMINATION:  General appearance: alert, cooperative, appears older than stated age and  moderately obese Neurologic: intact Heart: irregularly irregular rhythm Lungs: diminished breath sounds bilaterally Abdomen: soft, non-tender; bowel sounds normal; no masses,  no organomegaly and palable hernia midline Extremities: extremities normal, atraumatic, no cyanosis or edema and 1+ dp and PT pulses bilaterial mild edema no carotid bruits, needle stick left neck where he just had neck mass bx   Diagnostic Studies & Laboratory data:     Recent Radiology Findings:   Ct Chest W Contrast  09/23/2013   CLINICAL DATA:  13 mm right middle lobe lung nodule on neck CT. Ex-smoker. History of skin cancer in 2010. Prior CABG. COPD.  EXAM: CT CHEST WITH CONTRAST  TECHNIQUE: Multidetector CT imaging of the chest was performed during intravenous contrast administration.  CONTRAST:  52mL OMNIPAQUE IOHEXOL 300 MG/ML  SOLN  COMPARISON:  DG CHEST 2 VIEW dated 11/24/2012; CT NECK W/CM dated 09/07/2013  FINDINGS: Lungs/Pleura: Mild to moderate centrilobular emphysema. Corresponding to the abnormality on the prior exam is a posterior right upper lobe 1.4 x 1.4 cm nodule on image 24/ series 3. Minimal central cavitation.  Thickening of the right minor fissure on image 32/series 3.  Linear opacity at the left lung apex could represent an area of scarring. This is somewhat more nodular medially, measuring 7 mm on transverse image 12 and 11 mm on coronal image 83.  Small bilateral pleural effusions.  Heart/Mediastinum: Pacer with leads at right atrium and right ventricle. Right-sided thyroid nodule measures 1.9 cm.  Aortic and branch vessel atherosclerosis. Cardiomegaly and prior median sternotomy. Pulmonary artery enlargement. No pericardial effusion.  A right low paratracheal/precarinal node measures 1.5 x 2.4 cm on image 23/series 2. 1.3 cm low right paratracheal node on image 17.  Right infrahilar adenopathy at 1.6  cm on image 33.  Upper Abdomen: Fatty atrophy throughout the pancreas, especially the head and uncinate process. Mild renal atrophy. Normal adrenal glands. Gallbladder fundal calcifications, likely due to a fundal peripherally calcified stone. Image 66. A left paracentral ventral abdominal wall hernia which contains nonobstructed transverse colon.  Bones/Musculoskeletal:  Advanced thoracic spondylosis.  IMPRESSION: 1. Centrally cavitary right upper lobe pulmonary nodule. Suspicious for primary bronchogenic carcinoma. This is superimposed upon mild to moderate centrilobular emphysema. Consider further evaluation with PET. 2. Probable scarring at the left apex. This is somewhat nodular more medially. This could be re-evaluated at followup PET. 3. Cardiomegaly with small bilateral pleural effusions, suggesting a component of congestive heart failure/fluid overload. 4. Thoracic adenopathy. This is nonspecific in the setting of congestive heart failure. Could be secondary. However, metastatic disease cannot be excluded. 5. Pulmonary artery enlargement suggests pulmonary arterial hypertension. 6. Cholelithiasis. 7. Incompletely imaged transverse colon containing ventral abdominal wall hernia. 8. Indeterminate right-sided thyroid nodule. This could be further evaluated with thyroid ultrasound. However, given patient age and other comorbidities, of questionable significance.   Electronically Signed   By: Abigail Miyamoto M.D.   On: 09/23/2013 10:17   Nm Pet Image Initial (pi) Skull Base To Thigh  10/04/2013   CLINICAL DATA:  Initial treatment strategy for cavitary right upper lobe lung nodule on prior chest CT.  EXAM: NUCLEAR MEDICINE PET SKULL BASE TO THIGH  TECHNIQUE: 12.5 mCi F-18 FDG was injected intravenously. Full-ring PET imaging was performed from the skull base to thigh after the radiotracer. CT data was obtained and used for attenuation correction and anatomic localization.  FASTING BLOOD GLUCOSE:  Value: 97 mg/dl   COMPARISON:  CT CHEST W/CM dated 09/23/2013; CT NECK W/CM dated 09/07/2013; CT PELVIS  W/O CM dated 10/10/2008  FINDINGS: NECK  Two hypermetabolic nodules within the left parotid gland. The more superior and medial measures 1.2 cm and a S.U.V. max of 21.7 on image 21.  The more inferior and lateral measures 2.0 cm and a S.U.V. max of 23.0. No hypermetabolic cervical lymph nodes.  Hypermetabolism within the left side of the vallecula. This corresponds to relative underdistention in this area. This measures a S.U.V. max of 5.1 on image 33/series 4.  CHEST  Hypermetabolism which corresponds to the posterior right upper lobe cavitary nodule. This measures 1.3 cm and a S.U.V. max of 12.2 on image 28. Similar in size to on the prior exam, given differences in measurement technique.  The thoracic nodes detailed on prior exam are not hypermetabolic and are decreased in size. For example, a 1.0 cm right paratracheal node on image 63 measured 1.3 cm on the prior exam.  Right ventricular hypermetabolism which is nonspecific but may relate to a component of elevated right heart pressures/right heart failure.  ABDOMEN/PELVIS  No areas of abnormal hypermetabolism.  SKELETON  No abnormal marrow activity.  CT IMAGES PERFORMED FOR ATTENUATION CORRECTION  Neck findings deferred to recent diagnostic CT. No superimposed acute process.  Chest findings also deferred to recent diagnostic CT. Cardiomegaly with prior median sternotomy. The small bilateral pleural effusions have resolved. No hypermetabolism to correspond to the presumed scarring within the left upper lobe.  Bilateral renal atrophy. Subcentimeter lower pole left renal lesion which is likely a cyst. Infrarenal non aneurysmal aortic dilatation at 2.9 cm. This is chronic. Normal adrenal glands. Borderline aneurysmal dilatation of the right common iliac and ectasia of the left common iliac artery. 1.5 cm on the right and 1.4 cm on the left  Gallstones, including a peripherally  calcified stone at the fundus. Transverse colonic containing left para midline ventral abdominal wall hernia. There is a fat containing central abdominal wall hernia. More inferior upper pelvic small bowel containing ventral hernia. No obstruction. Moderate prostatomegaly. Right greater than left fat containing inguinal hernias. Small bilateral hydroceles.  IMPRESSION: 1. Hypermetabolism corresponding to the cavitary posterior right upper lobe lung nodule. Most consistent with primary bronchogenic carcinoma. The thoracic nodes are not hypermetabolic and are decreased in size. There were likely related to a congestive heart failure exacerbation. Therefore, presuming non-small-cell histology, most consistent with T1aN0M0 or stage IA. 2. Hypermetabolic left parotid nodules. 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 Please see neck CT report of 09/07/2013. 3. Hypermetabolism at the left side of the vallecula. Favored to be physiologic and related to secretions in this area. This area is underdistended. This could be re-evaluated on follow-up PET or if there is a clinical concern of mucosal lesion, direct visualization performed. 4. Incidental findings, including common iliac artery dilatation, gallstones, and ventral abdominal wall hernias.   Electronically Signed   By: Abigail Miyamoto M.D.   On: 10/04/2013 16:31      Recent Lab Findings: Lab Results  Component Value Date   WBC 8.5 05/27/2013   HGB 12.4* 05/27/2013   HCT 37.3* 05/27/2013   PLT 377.0 05/27/2013   GLUCOSE 112* 10/18/2013   ALT 14 11/18/2011   AST 22 11/18/2011   NA 141 10/18/2013   K 3.9 10/18/2013   CL 101 10/18/2013   CREATININE 1.5 10/18/2013   BUN 43* 10/18/2013   CO2 31 10/18/2013   INR 2.3 10/07/2013    PFT's PFT 01/11/10- moderate obstructive airways disease, insignificant response to bronchodilator.  FVC 2.28/ 62%, FEV1  1.35/ 58%, FEV1/FVC 0.59, TLC 90%, DLCO 57%  PFT's  10/12/2013 FEV1 1.75   81%   DLCO 46%  ECHO:10/12/2013 Study  Conclusions  - Left ventricle: The cavity size was normal. There was moderate focal basal hypertrophy of the septum. Systolic function was normal. The estimated ejection fraction was in the range of 55% to 60%. Basal inferior akinesis. Indeterminant diastolic function. - Aortic valve: Trileaflet; moderately calcified leaflets. There was no stenosis. - Mitral valve: Moderately calcified annulus. Mildly calcified leaflets . Mild regurgitation. - Left atrium: The atrium was mildly dilated. - Right ventricle: The cavity size was normal. Pacer wire or catheter noted in right ventricle. Systolic function was normal. - Tricuspid valve: Peak RV-RA gradient: 89mm Hg (S). - Pulmonary arteries: PA peak pressure: 8mm Hg (S). Impressions:  - Normal LV size with moderate focal basal septal hypertrophy (no LV outflow gradient or mitral valve systolic anterior motion). EF 55-60% with basal inferior akinesis. Normal RV size and systolic function.    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 The patient's somewhat limited pulmonary reserve means that he could tolerate a wedge resection. Other than his diffusion capacity is FEV1 has improved over the past several years. The risks and recovery from surgery were discussed with him in detail. He would like to proceed with surgical resection. I have discussed with him the option of stereotactic radiotherapy as discussed at the [facility] conference last week. At the insistence of his family he would like to see a radiation  oncologist prior to proceeding with surgery. We tentatively plan for bronchoscopy right video-assisted thoracoscopy and lung resection on Tuesday, April 8.  Patient will stop his Coumadin April 2.  more then 45 min time counseling the patient and family   Grace Isaac MD      [address] [place] [address] Office [phone]   Beeper [phone]  10/19/2013 11:05 AM

## 2013-10-19 NOTE — Telephone Encounter (Signed)
FYI for CY and he is aware. Nothing more needed

## 2013-10-20 ENCOUNTER — Ambulatory Visit: Payer: Medicare Other | Admitting: Cardiothoracic Surgery

## 2013-10-20 ENCOUNTER — Encounter: Payer: Self-pay | Admitting: Radiation Oncology

## 2013-10-20 ENCOUNTER — Ambulatory Visit
Admission: RE | Admit: 2013-10-20 | Discharge: 2013-10-20 | Disposition: A | Discharge status: Home / Self Care | Payer: Medicare Other | Source: Ambulatory Visit | Attending: Radiation Oncology | Admitting: Radiation Oncology

## 2013-10-20 VITALS — BP 108/70 | HR 93 | Temp 97.8°F | Resp 20 | Ht 66.0 in | Wt 215.7 lb

## 2013-10-20 DIAGNOSIS — C3430 Malignant neoplasm of lower lobe, unspecified bronchus or lung: Secondary | ICD-10-CM | POA: Insufficient documentation

## 2013-10-20 DIAGNOSIS — Z510 Encounter for antineoplastic radiation therapy: Secondary | ICD-10-CM | POA: Insufficient documentation

## 2013-10-20 DIAGNOSIS — R911 Solitary pulmonary nodule: Secondary | ICD-10-CM

## 2013-10-20 HISTORY — DX: Gastro-esophageal reflux disease without esophagitis: K21.9

## 2013-10-20 HISTORY — DX: Heart failure, unspecified: I50.9

## 2013-10-20 HISTORY — DX: Unspecified cataract: H26.9

## 2013-10-20 NOTE — Telephone Encounter (Signed)
Patient instructed that he will need a repeat INR 7-10 days after he restarts warfarin.  He will call to set this up.

## 2013-10-20 NOTE — Pre-Procedure Instructions (Addendum)
[name] Gill  10/20/2013   Your procedure is scheduled on:  Tuesday, April 7th   Report to [facility] short stay admitting at 5:30 AM.   Call this number if you have problems the morning of surgery: [phone]   Remember:   Do not eat food or drink liquids after midnight Monday.   Take these medicines the morning of surgery with A SIP OF WATER: Protonix,diltiazem, use inhalers as needed and bring with you to hosp            STOP all herbel meds, nsaids (aleve,naproxen,advil,ibuprofen) now including vitamins, folic acid, lycopene, fish oil, saw palmetto, selenium ,all over the counter meds   COUMADIN PER DR 4/2   Do not wear jewelry - no rings or watches.  Do not wear lotions or colognes. You may NOT wear deodorant.   Men may shave face and neck.   Do not bring valuables to the hospital.  [facility] is not responsible for any belongings or valuables.               Contacts, dentures or bridgework may not be worn into surgery.  Leave suitcase in the car. After surgery it may be brought to your room.   For patients admitted to the hospital, discharge time is determined by your treatment team.    Name and phone number of your driver:    Special Instruction:  Special Instructions: [place] - Preparing for Surgery  Before surgery, you can play an important role.  Because skin is not sterile, your skin needs to be as free of germs as possible.  You can reduce the number of germs on you skin by washing with CHG (chlorahexidine gluconate) soap before surgery.  CHG is an antiseptic cleaner which kills germs and bonds with the skin to continue killing germs even after washing.  Please DO NOT use if you have an allergy to CHG or antibacterial soaps.  If your skin becomes reddened/irritated stop using the CHG and inform your nurse when you arrive at Short Stay.  Do not shave (including legs and underarms) for at least 48 hours prior to the first CHG shower.  You may shave your  face.  Please follow these instructions carefully:   1.  Shower with CHG Soap the night before surgery and the morning of Surgery.  2.  If you choose to wash your hair, wash your hair first as usual with your normal shampoo.  3.  After you shampoo, rinse your hair and body thoroughly to remove the Shampoo.  4.  Use CHG as you would any other liquid soap.  You can apply chg directly  to the skin and wash gently with scrungie or a clean washcloth.  5.  Apply the CHG Soap to your body ONLY FROM THE NECK DOWN.  Do not use on open wounds or open sores.  Avoid contact with your eyes ears, mouth and genitals (private parts).  Wash genitals (private parts)       with your normal soap.  6.  Wash thoroughly, paying special attention to the area where your surgery will be performed.  7.  Thoroughly rinse your body with warm water from the neck down.  8.  DO NOT shower/wash with your normal soap after using and rinsing off the CHG Soap.  9.  Pat yourself dry with a clean towel.            10.  Wear clean pajamas.  11.  Place clean sheets on your bed the night of your first shower and do not sleep with pets.  Day of Surgery  Do not apply any lotions/deodorants the morning of surgery.  Please wear clean clothes to the hospital/surgery center.   Please read over the following fact sheets that you were given: Pain Booklet, Coughing and Deep Breathing, Blood Transfusion Information, MRSA Information and Surgical Site Infection Prevention

## 2013-10-20 NOTE — Progress Notes (Signed)
Please see the Nurse Progress Note in the MD Initial Consult Encounter for this patient. 

## 2013-10-21 ENCOUNTER — Encounter (HOSPITAL_COMMUNITY)
Admission: RE | Admit: 2013-10-21 | Discharge: 2013-10-21 | Disposition: A | Discharge status: Home / Self Care | Payer: Medicare Other | Source: Ambulatory Visit | Attending: Cardiothoracic Surgery | Admitting: Cardiothoracic Surgery

## 2013-10-21 ENCOUNTER — Encounter (HOSPITAL_COMMUNITY): Payer: Self-pay

## 2013-10-21 ENCOUNTER — Other Ambulatory Visit (HOSPITAL_COMMUNITY): Payer: Medicare Other

## 2013-10-21 VITALS — BP 121/60 | HR 79 | Temp 97.8°F | Resp 20 | Ht 66.0 in | Wt 216.6 lb

## 2013-10-21 DIAGNOSIS — Z01810 Encounter for preprocedural cardiovascular examination: Secondary | ICD-10-CM | POA: Insufficient documentation

## 2013-10-21 DIAGNOSIS — R911 Solitary pulmonary nodule: Secondary | ICD-10-CM

## 2013-10-21 DIAGNOSIS — Z01812 Encounter for preprocedural laboratory examination: Secondary | ICD-10-CM | POA: Insufficient documentation

## 2013-10-21 HISTORY — DX: Unspecified osteoarthritis, unspecified site: M19.90

## 2013-10-21 HISTORY — DX: Personal history of other diseases of the digestive system: Z87.19

## 2013-10-21 HISTORY — DX: Cardiac arrhythmia, unspecified: I49.9

## 2013-10-21 LAB — COMPREHENSIVE METABOLIC PANEL
ALT: 14 U/L (ref 0–53)
AST: 22 U/L (ref 0–37)
Albumin: 3.3 g/dL — ABNORMAL LOW (ref 3.5–5.2)
Alkaline Phosphatase: 39 U/L (ref 39–117)
BUN: 37 mg/dL — ABNORMAL HIGH (ref 6–23)
CO2: 23 mEq/L (ref 19–32)
Calcium: 8.9 mg/dL (ref 8.4–10.5)
Chloride: 106 mEq/L (ref 96–112)
Creatinine, Ser: 1.34 mg/dL (ref 0.50–1.35)
GFR calc Af Amer: 54 mL/min — ABNORMAL LOW (ref >90)
GFR calc non Af Amer: 47 mL/min — ABNORMAL LOW (ref >90)
Glucose, Bld: 105 mg/dL — ABNORMAL HIGH (ref 70–99)
Potassium: 4.6 mEq/L (ref 3.7–5.3)
Sodium: 144 mEq/L (ref 137–147)
Total Bilirubin: 0.3 mg/dL (ref 0.3–1.2)
Total Protein: 6.8 g/dL (ref 6.0–8.3)

## 2013-10-21 LAB — URINALYSIS, ROUTINE W REFLEX MICROSCOPIC
Bilirubin Urine: NEGATIVE
Glucose, UA: NEGATIVE mg/dL
Hgb urine dipstick: NEGATIVE
Ketones, ur: NEGATIVE mg/dL
Leukocytes, UA: NEGATIVE
Nitrite: NEGATIVE
Protein, ur: NEGATIVE mg/dL
Specific Gravity, Urine: 1.017 (ref 1.005–1.030)
Urobilinogen, UA: 1 mg/dL (ref 0.0–1.0)
pH: 6.5 (ref 5.0–8.0)

## 2013-10-21 LAB — BLOOD GAS, ARTERIAL
Acid-Base Excess: 3.5 mmol/L — ABNORMAL HIGH (ref 0.0–2.0)
Bicarbonate: 27.4 mEq/L — ABNORMAL HIGH (ref 20.0–24.0)
Drawn by: 344381
FIO2: 0.21 %
O2 Saturation: 94.6 %
Patient temperature: 98.6
TCO2: 28.7 mmol/L (ref 0–100)
pCO2 arterial: 41.1 mmHg (ref 35.0–45.0)
pH, Arterial: 7.439 (ref 7.350–7.450)
pO2, Arterial: 74.1 mmHg — ABNORMAL LOW (ref 80.0–100.0)

## 2013-10-21 LAB — CBC
HCT: 35.1 % — ABNORMAL LOW (ref 39.0–52.0)
Hemoglobin: 11.4 g/dL — ABNORMAL LOW (ref 13.0–17.0)
MCH: 27.4 pg (ref 26.0–34.0)
MCHC: 32.5 g/dL (ref 30.0–36.0)
MCV: 84.4 fL (ref 78.0–100.0)
Platelets: 249 10*3/uL (ref 150–400)
RBC: 4.16 MIL/uL — ABNORMAL LOW (ref 4.22–5.81)
RDW: 17.2 % — ABNORMAL HIGH (ref 11.5–15.5)
WBC: 6.6 10*3/uL (ref 4.0–10.5)

## 2013-10-21 LAB — TYPE AND SCREEN
ABO/RH(D): O NEG
Antibody Screen: NEGATIVE

## 2013-10-21 LAB — SURGICAL PCR SCREEN
MRSA, PCR: NEGATIVE
STAPHYLOCOCCUS AUREUS: NEGATIVE

## 2013-10-21 NOTE — Progress Notes (Signed)
Radiation Oncology         [phone]) [phone] ________________________________  Initial outpatient Consultation - Date: 10/20/2013   Name: [name] Gill MRN: [id number]   DOB: 11/09/1925  REFERRING PHYSICIAN: [name], MD  DIAGNOSIS:  1. Lung nodule    HISTORY OF PRESENT ILLNESS::[name] Gill is a 80 y.o. male  who palpated a left neck mass. A CT of the neck was ordered. On the lowest slice of the CT of the neck a lung nodule was seen. The neck mass was determined to be benign. A followup PET/CT was performed to evaluate this right upper lobe lung nodule. This nodule measured 1.3 cm and had an SUV of 12.2. Mediastinal lymph nodes were decreasing in size and not hypermetabolic. No evidence of metastatic disease was noted. He was referred to cardiothoracic surgery. He is fully functional with a performance status of ECoG 0. He cares for his wife who has had several orthopedic surgeries recently. He has been a part of the pulmonary rehabilitation program for the past 2 years. He denies any hemoptysis headaches or new bone pain. He is accompanied by his son today for consideration of stereotactic body radiotherapy in the treatment of this likely stage I non-small cell lung cancer. He has not had a biopsy.[name]  PREVIOUS RADIATION THERAPY: No  PAST MEDICAL HISTORY:  has a past medical history of OSA (obstructive sleep apnea); Atrial fibrillation; Diverticulitis (1975); Hypertension; COPD (chronic obstructive pulmonary disease); Pacemaker -Medtronic; Ventricular tachycardia, non-sustained; Hyperlipidemia; PVD (peripheral vascular disease); Obesity; Ichthyosis; Anemia; Renal insufficiency; Hyperglycemia; H/O echocardiogram; Incisional hernia; Low testosterone; Tachycardia-bradycardia syndrome; Chronic anticoagulation; CHD (coronary heart disease); Aortic root dilatation; CHF (congestive heart failure); Obesity; Cataract; and GERD (gastroesophageal reflux disease).    PAST SURGICAL HISTORY: Past Surgical  History  Procedure Laterality Date  . Broken nose    . Pacemaker insertion  02/2008    medtronic  . Coronary artery bypass graft  5784,[id number]    coronary disease s/p CABG 2004 and Cath 2009  . Cardiac catheterization  2009    FAMILY HISTORY:  Family History  Problem Relation Age of Onset  . Tuberculosis Father     arrested  . Cancer Father   . Aortic stenosis Mother     ASCVD    SOCIAL HISTORY:  History  Substance Use Topics  . Smoking status: Former Smoker -- 2.00 packs/day for 55 years    Types: Cigarettes    Quit date: 07/21/1998  . Smokeless tobacco: Never Used  . Alcohol Use: Yes     Comment: yes "rarely"    ALLERGIES: Testosterone  MEDICATIONS:  Current Outpatient Prescriptions  Medication Sig Dispense Refill  . Ascorbic Acid (VITAMIN C) 500 MG tablet Take 500 mg by mouth daily.        [name] atorvastatin (LIPITOR) 80 MG tablet Take 80 mg by mouth daily.       . beta carotene w/minerals (OCUVITE) tablet Take 1 tablet by mouth daily.       . budesonide-formoterol (SYMBICORT) 160-4.5 MCG/ACT inhaler Inhale 2 puffs into the lungs 2 (two) times daily.      . Cholecalciferol (VITAMIN D PO) Take 1 tablet by mouth daily.       [name] diltiazem (CARDIZEM CD) 180 MG 24 hr capsule TAKE 1 CAPSULE BY MOUTH DAILY  30 capsule  6  . fenofibrate 54 MG tablet Take 54 mg by mouth Daily.       [name] FOLIC ACID PO Take 1 tablet by mouth daily.       [name]  furosemide (LASIX) 80 MG tablet 80 in the am and 40  In the pm      . Lycopene 10 MG CAPS Take 10 mg by mouth daily.      . Multiple Vitamin (MULTIVITAMIN PO) Take 1 tablet by mouth daily.       . Omega-3 Fatty Acids (FISH OIL) 1000 MG CAPS Take 3,000 mg by mouth daily.       [name] OVER THE COUNTER MEDICATION Take 4-5 oz by mouth every evening. "Diet tonic water"- quinine      . pantoprazole (PROTONIX) 40 MG tablet Take 40 mg by mouth daily.       . potassium chloride (KLOR-CON 10) 10 MEQ CR tablet Take 10 mEq by mouth daily.        . saw palmetto 160  MG capsule Take 160 mg by mouth daily. Takes 5 capsules daily      . SELENIUM PO Take 1 tablet by mouth daily.       [name] tiotropium (SPIRIVA) 18 MCG inhalation capsule Place 18 mcg into inhaler and inhale daily.      [name] warfarin (COUMADIN) 2.5 MG tablet Take 5 mg by mouth daily at 6 PM. Take as directed      . albuterol (PROVENTIL HFA;VENTOLIN HFA) 108 (90 BASE) MCG/ACT inhaler Inhale 2 puffs into the lungs every 6 (six) hours as needed for wheezing or shortness of breath. Rescue  1 Inhaler  2  . NITROSTAT 0.4 MG SL tablet Place 0.4 mg under the tongue every 5 (five) minutes as needed for chest pain. Take as directed       No current facility-administered medications for this encounter.    REVIEW OF SYSTEMS:  A 15 point review of systems is documented in the electronic medical record. This was obtained by the nursing staff. However, I reviewed this with the patient to discuss relevant findings and make appropriate changes.  Pertinent items are noted in HPI.  PHYSICAL EXAM:  Filed Vitals:   10/20/13 0808  BP: 108/70  Pulse: 93  Temp: 97.8 F (36.6 C)  Resp: 20  .215 lb 11.2 oz (97.841 kg). Pleasant male in no distress sitting comfortably on examining table. Alert minus x3. Normal rest or effort. Lung sounds clear bilaterally. 5 out of 5 strength in his bilateral upper extremities. Good upper extremity range of motion.  LABORATORY DATA:  Lab Results  Component Value Date   WBC 8.5 05/27/2013   HGB 12.4* 05/27/2013   HCT 37.3* 05/27/2013   MCV 89.2 05/27/2013   PLT 377.0 05/27/2013   Lab Results  Component Value Date   NA 141 10/18/2013   K 3.9 10/18/2013   CL 101 10/18/2013   CO2 31 10/18/2013   Lab Results  Component Value Date   ALT 14 11/18/2011   AST 22 11/18/2011   ALKPHOS 36* 11/18/2011   BILITOT 1.1 11/18/2011     RADIOGRAPHY:   Ct Chest W Contrast 09/23/2013   IMPRESSION: 1. Centrally cavitary right upper lobe pulmonary nodule. Suspicious for primary bronchogenic carcinoma. This  is superimposed upon mild to moderate centrilobular emphysema. Consider further evaluation with PET. 2. Probable scarring at the left apex. This is somewhat nodular more medially. This could be re-evaluated at followup PET. 3. Cardiomegaly with small bilateral pleural effusions, suggesting a component of congestive heart failure/fluid overload. 4. Thoracic adenopathy. This is nonspecific in the setting of congestive heart failure. Could be secondary. However, metastatic disease cannot be excluded. 5. Pulmonary artery enlargement  suggests pulmonary arterial hypertension. 6. Cholelithiasis. 7. Incompletely imaged transverse colon containing ventral abdominal wall hernia. 8. Indeterminate right-sided thyroid nodule. This could be further evaluated with thyroid ultrasound. However, given patient age and other comorbidities, of questionable significance.   Electronically Signed   By: Abigail Miyamoto M.D.   On: 09/23/2013 10:17   Nm Pet Image Initial (pi) Skull Base To Thigh 10/04/2013   CLINICAL DATA:  Initial treatment strategy for cavitary right upper lobe lung nodule on prior chest CT.  EXAM: NUCLEAR MEDICINE PET SKULL BASE TO THIGH    IMPRESSION: 1. Hypermetabolism corresponding to the cavitary posterior right upper lobe lung nodule. Most consistent with primary bronchogenic carcinoma. The thoracic nodes are not hypermetabolic and are decreased in size. There were likely related to a congestive heart failure exacerbation. Therefore, presuming non-small-cell histology, most consistent with T1aN0M0 or stage IA. 2. Hypermetabolic left parotid nodules. 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 Please see neck CT report of 09/07/2013. 3. Hypermetabolism at the left side of the vallecula. Favored to be physiologic and related to secretions in this area. This area is underdistended. This could be re-evaluated on follow-up PET or if there is a clinical concern of mucosal lesion, direct visualization performed. 4.  Incidental findings, including common iliac artery dilatation, gallstones, and ventral abdominal wall hernias.   Electronically Signed   By: Abigail Miyamoto M.D.   On: 10/04/2013 16:31      IMPRESSION: Likely stage I non-small cell lung cancer  PLAN: I spoke to the patient and his son today. We discussed the excellent local control achieved by SBRT for her lung lesion such as his. We discussed the process of simulation and the use of respiratory compression. We discussed 3-5 treatments as an outpatient. We discussed the low likelihood of symptomatic lung or rib damage. We discussed that to proceed forward with SBRT he would require a CT-guided biopsy and we talked about the possible complications with fat including pneumothorax. At this point he is getting information and comparing his options. He is on the operating room scheduled for next week. He will let me know if he is interested in proceeding forward with SBRT. I gave him some information regarding this treatment option.  I spent 60 minutes  face to face with the patient and more than 50% of that time was spent in counseling and/or coordination of care.   ------------------------------------------------  Thea Silversmith, MD

## 2013-10-21 NOTE — Addendum Note (Signed)
Encounter addended by: Deirdre Evener, RN on: 10/21/2013  6:10 PM<BR>     Documentation filed: Charges VN

## 2013-10-24 ENCOUNTER — Other Ambulatory Visit: Payer: Self-pay | Admitting: *Deleted

## 2013-10-24 ENCOUNTER — Telehealth: Payer: Self-pay | Admitting: *Deleted

## 2013-10-24 ENCOUNTER — Encounter (HOSPITAL_COMMUNITY): Payer: Self-pay | Admitting: Vascular Surgery

## 2013-10-24 NOTE — Progress Notes (Signed)
Anesthesia chart review: Patient is an 80 year old male scheduled for bronchoscopy, right VATS, lung resection on 10/25/13 by Dr. Servando Snare.   History includes former smoker, CAD s/p CABG '09, CHF, atrial fibrillation, nonsustained ventricular tachycardia, tachy-brady syndrome, s/p removal of previous dual chamber PPM and insertion of Medtronic single chamber pacemaker 06/01/13, COPD, hypertension, moderate left ICA stenosis, aortic root dilation ( hyperlipidemia, hyperglycemia, CKD stage III, GERD, hiatal hernia, arthritis, obstructive sleep apnea, anemia, colon surgery for diverticulitis '75, history of nasal fracture, . BMI is ~ 35 consistent with obesity. PCP is Dr. Truddie Coco. Pulmonologist is Dr. Annamaria Boots. Cardiologist is Dr. Fransico Him who saw him for preoperative clearance on 10/07/13 and was felt to be low risk from a cardiac standpoint. EP cardiologist is Dr. Lovena Le.  EKG on 10/21/13 showed v-paced rhythm, underlying afib, pacer induced left BBB.  Echo on 10/12/13 showed: Normal LV size with moderate focal basal septal hypertrophy (no LV outflow gradient or mitral valve systolic anterior motion). EF 55-60% with basal inferior akinesis. Normal RV size and systolic function. Mild MR, trivial TR.  Preoperative labs and 10/12/13 PFTs noted.  He is for CXR and PT/PTT on the day of surgery.  UPDATE: Scheduling just called and said patient has decided not to have the surgery.  George Hugh [facility] Short Stay Center/Anesthesiology Phone [phone] 10/24/2013 11:16 AM

## 2013-10-24 NOTE — Telephone Encounter (Signed)
Patient called and wants to cancel surgery scheduled for Tuesday 4/7.  He says he wants to do radiation only.  Dr. Servando Snare made aware.

## 2013-10-24 NOTE — Telephone Encounter (Signed)
Patient called and wants to cancel surgery for Tuesday 4/7.  He wants radiation only.  Dr. Servando Snare made aware.

## 2013-10-25 ENCOUNTER — Inpatient Hospital Stay (HOSPITAL_COMMUNITY): Admission: RE | Admit: 2013-10-25 | Payer: Medicare Other | Source: Ambulatory Visit | Admitting: Cardiothoracic Surgery

## 2013-10-25 ENCOUNTER — Telehealth: Payer: Self-pay | Admitting: *Deleted

## 2013-10-25 ENCOUNTER — Encounter (HOSPITAL_COMMUNITY): Admission: RE | Payer: Self-pay | Source: Ambulatory Visit

## 2013-10-25 ENCOUNTER — Other Ambulatory Visit: Payer: Self-pay | Admitting: Radiation Oncology

## 2013-10-25 DIAGNOSIS — R911 Solitary pulmonary nodule: Secondary | ICD-10-CM

## 2013-10-25 SURGERY — BRONCHOSCOPY, VIDEO-ASSISTED
Anesthesia: General | Site: Chest | Laterality: Right

## 2013-10-25 NOTE — Telephone Encounter (Signed)
RETURNED PATIENT'S SON PHONE CALL, LVM FOR A RETURN CALL

## 2013-10-25 NOTE — Telephone Encounter (Signed)
CALLED PATIENT'S SON MARK AND INFORMED  OF BIOPSY AND FNC APPT., SPOKE WITH PATIENT'S SON MARK AND HE IS AWARE OF THESE APPTS.

## 2013-10-26 ENCOUNTER — Telehealth: Payer: Self-pay | Admitting: *Deleted

## 2013-10-26 ENCOUNTER — Telehealth: Payer: Self-pay | Admitting: Cardiology

## 2013-10-26 ENCOUNTER — Ambulatory Visit: Payer: Medicare Other | Admitting: Radiation Oncology

## 2013-10-26 ENCOUNTER — Telehealth: Payer: Self-pay | Admitting: Radiation Oncology

## 2013-10-26 ENCOUNTER — Telehealth: Payer: Self-pay | Admitting: Internal Medicine

## 2013-10-26 ENCOUNTER — Other Ambulatory Visit: Payer: Self-pay | Admitting: Cardiology

## 2013-10-26 NOTE — Telephone Encounter (Signed)
Called son, Joeph Szatkowski re: pt's Coumadin, stopping, restarting in reference to his ct guided biopsy on 10/31/13. Left name, number and requested call back.

## 2013-10-26 NOTE — Telephone Encounter (Signed)
[name] Gill returned call.  [name]

## 2013-10-26 NOTE — Telephone Encounter (Signed)
Yes ok to hold coumadin for biopsy

## 2013-10-26 NOTE — Telephone Encounter (Signed)
New message          [name] Gill is calling to get the pt cleared to stop his coumadin. Pt is having a CT lung biopsy 10/31/13. Please call this office.

## 2013-10-26 NOTE — Telephone Encounter (Signed)
Spoke with nurse at Dr. Unknown Jim office to make them aware.

## 2013-10-26 NOTE — Telephone Encounter (Signed)
Called and LMTCB x1--it looks like cardiology is following pt coumadin

## 2013-10-26 NOTE — Telephone Encounter (Signed)
Spoke with the pt and notified that Dr Fransico Him manages this pt's warfarin  She verbalized understanding and states nothing further needed

## 2013-10-26 NOTE — Telephone Encounter (Signed)
To Dr. Radford Pax to determine if cleared to hold coumadin for biopsy.  Would need to hold warfarin 5 days I suspect.

## 2013-10-26 NOTE — Telephone Encounter (Signed)
Received call from [facility], pharmacist. Calling on behalf of Dr. Fransico Him to authorize this patient to stop his coumadin 5 days prior to biopsy. Routed this message to Dr. Pablo Ledger. Phone daughter, [name], informed her that her father is OK to stop taking his coumadin today per Dr. Radford Pax in preparation for his upcoming biopsy.

## 2013-10-27 ENCOUNTER — Ambulatory Visit (HOSPITAL_COMMUNITY): Payer: Medicare Other

## 2013-10-28 ENCOUNTER — Telehealth: Payer: Self-pay | Admitting: Internal Medicine

## 2013-10-28 ENCOUNTER — Other Ambulatory Visit: Payer: Self-pay | Admitting: Radiology

## 2013-10-28 NOTE — Telephone Encounter (Signed)
Spoke w/Val---received form and put for [employer]. Will fax back as soon as gets signature.

## 2013-10-28 NOTE — Telephone Encounter (Signed)
New message     Pt is scheduled to start radiation on 11-09-13.  They need clearance from Dr Lovena Le because pt has a defibulator or pacemaker.  They are faxing a clearance form--please be on the lookout and return it ASAP

## 2013-10-28 NOTE — Progress Notes (Signed)
Pacemaker form re-faxed to [facility] at [place] office.Patient for return to see Dr.Wentworth on 11/09/13.

## 2013-10-31 ENCOUNTER — Ambulatory Visit (HOSPITAL_COMMUNITY)
Admission: RE | Admit: 2013-10-31 | Discharge: 2013-10-31 | Disposition: A | Discharge status: Home / Self Care | Payer: Medicare Other | Source: Ambulatory Visit | Attending: Radiation Oncology | Admitting: Radiation Oncology

## 2013-10-31 DIAGNOSIS — E785 Hyperlipidemia, unspecified: Secondary | ICD-10-CM | POA: Insufficient documentation

## 2013-10-31 DIAGNOSIS — Z87891 Personal history of nicotine dependence: Secondary | ICD-10-CM | POA: Insufficient documentation

## 2013-10-31 DIAGNOSIS — R911 Solitary pulmonary nodule: Secondary | ICD-10-CM | POA: Insufficient documentation

## 2013-10-31 DIAGNOSIS — J4489 Other specified chronic obstructive pulmonary disease: Secondary | ICD-10-CM | POA: Insufficient documentation

## 2013-10-31 DIAGNOSIS — J449 Chronic obstructive pulmonary disease, unspecified: Secondary | ICD-10-CM | POA: Insufficient documentation

## 2013-10-31 DIAGNOSIS — Z951 Presence of aortocoronary bypass graft: Secondary | ICD-10-CM | POA: Insufficient documentation

## 2013-10-31 DIAGNOSIS — R222 Localized swelling, mass and lump, trunk: Secondary | ICD-10-CM | POA: Insufficient documentation

## 2013-10-31 DIAGNOSIS — I4891 Unspecified atrial fibrillation: Secondary | ICD-10-CM | POA: Insufficient documentation

## 2013-10-31 DIAGNOSIS — I10 Essential (primary) hypertension: Secondary | ICD-10-CM | POA: Insufficient documentation

## 2013-10-31 DIAGNOSIS — I2510 Atherosclerotic heart disease of native coronary artery without angina pectoris: Secondary | ICD-10-CM | POA: Insufficient documentation

## 2013-10-31 DIAGNOSIS — Z950 Presence of cardiac pacemaker: Secondary | ICD-10-CM | POA: Insufficient documentation

## 2013-10-31 LAB — CBC
HCT: 35.8 % — ABNORMAL LOW (ref 39.0–52.0)
HEMOGLOBIN: 11.5 g/dL — AB (ref 13.0–17.0)
MCH: 26.9 pg (ref 26.0–34.0)
MCHC: 32.1 g/dL (ref 30.0–36.0)
MCV: 83.6 fL (ref 78.0–100.0)
PLATELETS: 256 10*3/uL (ref 150–400)
RBC: 4.28 MIL/uL (ref 4.22–5.81)
RDW: 17 % — ABNORMAL HIGH (ref 11.5–15.5)
WBC: 6.5 10*3/uL (ref 4.0–10.5)

## 2013-10-31 LAB — PROTIME-INR
INR: 1.13 (ref 0.00–1.49)
PROTHROMBIN TIME: 14.3 s (ref 11.6–15.2)

## 2013-10-31 LAB — APTT: aPTT: 33 seconds (ref 24–37)

## 2013-10-31 MED ORDER — FENTANYL CITRATE 0.05 MG/ML IJ SOLN
INTRAMUSCULAR | Status: AC
  Filled 2013-10-31: qty 4

## 2013-10-31 MED ORDER — SODIUM CHLORIDE 0.9 % IV SOLN: INTRAVENOUS | Status: DC

## 2013-10-31 MED ORDER — MIDAZOLAM HCL 2 MG/2ML IJ SOLN
INTRAMUSCULAR | Status: AC
  Filled 2013-10-31: qty 4

## 2013-10-31 MED ORDER — LIDOCAINE HCL 1 % IJ SOLN
INTRAMUSCULAR | Status: AC
  Filled 2013-10-31: qty 10

## 2013-10-31 MED ORDER — MIDAZOLAM HCL 2 MG/2ML IJ SOLN
INTRAMUSCULAR | Status: AC | PRN
  Administered 2013-10-31: 1 mg via INTRAVENOUS

## 2013-10-31 MED ORDER — FENTANYL CITRATE 0.05 MG/ML IJ SOLN
INTRAMUSCULAR | Status: AC | PRN
  Administered 2013-10-31: 50 ug via INTRAVENOUS

## 2013-10-31 NOTE — Procedures (Signed)
Interventional Radiology Procedure Note  Procedure: CT guided biopsy of RUL pulmonary nodule Complications: No immediate Recommendations: - Bedrest until CXR cleared.  Minimize talking, coughing or otherwise straining.  - Follow up 2 hr CXR pending   Signed,  Criselda Peaches, MD [facility]

## 2013-10-31 NOTE — H&P (Signed)
Agree with PA note.  Plan for RUL pulmonary nodule biopsy with CT guidance.  Signed,  Criselda Peaches, MD [facility] Radiology

## 2013-10-31 NOTE — H&P (Signed)
[name] Gill is an 80 y.o. male.   Chief Complaint: "I'm having a lung biopsy" HPI: Patient with  history of smoking/COPD and finding of hypermetabolic RUL lung mass/left parotid nodules on recent PET presents today for CT guided RUL lung mass biopsy.  Past Medical History  Diagnosis Date  . Atrial fibrillation     coumadin, pacemaker/ CHRONIC  . Diverticulitis 1975  . Hypertension   . COPD (chronic obstructive pulmonary disease)     FEV1 1.35/58% 01-11-10  . Pacemaker -Medtronic     Medtronic  . Ventricular tachycardia, non-sustained     Detected on the device  . Hyperlipidemia     goal LDL less than 70  . PVD (peripheral vascular disease)     w mod left external carotid artery stenosis.   . Obesity   . Ichthyosis   . Renal insufficiency     stage III   . Hyperglycemia     mild glucose 111 on 11/2010  . H/O echocardiogram     nl LVF w severe ASSH and mod cLVH, mildly dilated aorta, milf LAE, mild MR/TR and moderate AVSC  . Incisional hernia     2 upper and 1 lower abdominal incisional hernia.   . Low testosterone     start androgel  . Tachycardia-bradycardia syndrome     s/p PPM  . Chronic anticoagulation   . CHD (coronary heart disease)     s/p CABG with patents grafts at cath 2009  . Aortic root dilatation   . CHF (congestive heart failure)   . Obesity   . Cataract      no surgery  . GERD (gastroesophageal reflux disease)   . Dysrhythmia     afib  . OSA (obstructive sleep apnea)     04-04-08 NPSG AHI 14.5, 3L/M suppl O2 for nadir 86%  . H/O hiatal hernia   . Arthritis   . Anemia     hx    Past Surgical History  Procedure Laterality Date  . Broken nose    . Pacemaker insertion  02/2008    medtronic  . Coronary artery bypass graft  3762,8315    coronary disease s/p CABG 2004 and Cath 2009  . Cardiac catheterization  2009  . Pacemaker insertion  11/14  . Colon surgery  85    diverticulitis    Family History  Problem Relation Age of Onset  .  Tuberculosis Father     arrested  . Cancer Father   . Aortic stenosis Mother     ASCVD   Social History:  reports that he quit smoking about 16 years ago. His smoking use included Cigarettes. He has a 110 pack-year smoking history. He has never used smokeless tobacco. He reports that he drinks alcohol. He reports that he does not use illicit drugs.  Allergies:  Allergies  Allergen Reactions  . Testosterone Swelling    In legs    Current outpatient prescriptions:Ascorbic Acid (VITAMIN C) 500 MG tablet, Take 500 mg by mouth daily.  , Disp: , Rfl: ;  atorvastatin (LIPITOR) 80 MG tablet, Take 80 mg by mouth daily. , Disp: , Rfl: ;  beta carotene w/minerals (OCUVITE) tablet, Take 1 tablet by mouth daily. , Disp: , Rfl: ;  budesonide-formoterol (SYMBICORT) 160-4.5 MCG/ACT inhaler, Inhale 2 puffs into the lungs 2 (two) times daily., Disp: , Rfl:  Cholecalciferol (VITAMIN D PO), Take 1 tablet by mouth daily. , Disp: , Rfl: ;  diltiazem (CARDIZEM CD) 180 MG 24 hr  capsule, TAKE 1 CAPSULE BY MOUTH DAILY, Disp: 30 capsule, Rfl: 6;  fenofibrate 54 MG tablet, Take 54 mg by mouth Daily. , Disp: , Rfl: ;  FOLIC ACID PO, Take 1 tablet by mouth daily. , Disp: , Rfl: ;  furosemide (LASIX) 80 MG tablet, 80 in the am and 40  In the pm, Disp: , Rfl:  GRAPE SEED EXTRACT PO, Take 1 capsule by mouth 2 (two) times daily., Disp: , Rfl: ;  Lycopene 10 MG CAPS, Take 10 mg by mouth daily., Disp: , Rfl: ;  Misc Natural Products (BLACK CHERRY CONCENTRATE PO), Take 2 capsules by mouth daily., Disp: , Rfl: ;  Multiple Vitamin (MULTIVITAMIN PO), Take 1 tablet by mouth daily. , Disp: , Rfl:  NITROSTAT 0.4 MG SL tablet, Place 0.4 mg under the tongue every 5 (five) minutes as needed for chest pain. Take as directed, Disp: , Rfl: ;  Omega-3 Fatty Acids (FISH OIL) 1000 MG CAPS, Take 3,000 mg by mouth daily. , Disp: , Rfl: ;  OVER THE COUNTER MEDICATION, Take 4-5 oz by mouth every evening. "Diet tonic water"- quinine, Disp: , Rfl: ;   pantoprazole (PROTONIX) 40 MG tablet, Take 40 mg by mouth daily. , Disp: , Rfl:  potassium chloride (KLOR-CON 10) 10 MEQ CR tablet, Take 10 mEq by mouth daily.  , Disp: , Rfl: ;  saw palmetto 160 MG capsule, Take 160 mg by mouth daily. Takes 5 capsules daily, Disp: , Rfl: ;  SELENIUM PO, Take 1 tablet by mouth daily. , Disp: , Rfl: ;  tiotropium (SPIRIVA) 18 MCG inhalation capsule, Place 18 mcg into inhaler and inhale daily., Disp: , Rfl: ;  TURMERIC PO, Take 1 capsule by mouth daily., Disp: , Rfl:  warfarin (COUMADIN) 2.5 MG tablet, Take 5 mg by mouth daily at 6 PM. Take as directed, Disp: , Rfl:  Current facility-administered medications:0.9 %  sodium chloride infusion, , Intravenous, Continuous, Hedy Jacob, PA-C  Results for orders placed during the hospital encounter of 10/31/13  APTT      Result Value Ref Range   aPTT 33  24 - 37 seconds  CBC      Result Value Ref Range   WBC 6.5  4.0 - 10.5 K/uL   RBC 4.28  4.22 - 5.81 MIL/uL   Hemoglobin 11.5 (*) 13.0 - 17.0 g/dL   HCT 35.8 (*) 39.0 - 52.0 %   MCV 83.6  78.0 - 100.0 fL   MCH 26.9  26.0 - 34.0 pg   MCHC 32.1  30.0 - 36.0 g/dL   RDW 17.0 (*) 11.5 - 15.5 %   Platelets 256  150 - 400 K/uL  PROTIME-INR      Result Value Ref Range   Prothrombin Time 14.3  11.6 - 15.2 seconds   INR 1.13  0.00 - 1.49    Review of Systems  Constitutional: Negative for fever and chills.  Respiratory: Negative for cough, hemoptysis and shortness of breath.   Cardiovascular: Negative for chest pain.  Gastrointestinal: Negative for nausea, vomiting and abdominal pain.  Musculoskeletal: Negative for back pain.  Neurological: Negative for headaches.   Filed Vitals:   10/31/13 0842  BP: 138/54  Pulse: 70  Temp: 98.5 F (36.9 C)  TempSrc: Oral  Resp: 20  Height: 5\' 6"  (1.676 m)  Weight: 215 lb (97.523 kg)  SpO2: 93%     Physical Exam  Constitutional: He is oriented to person, place, and time. He appears well-developed and well-nourished.  Cardiovascular:  Murmur heard. irreg irreg  Respiratory: Effort normal.  Distant BS with few exp wheezes; left chest wall pacer  GI: Soft. Bowel sounds are normal.  Obese; ventral abd wall hernia  Musculoskeletal: Normal range of motion. He exhibits edema.  Neurological: He is alert and oriented to person, place, and time.     Assessment/Plan Patient with  history of smoking/COPD and finding of hypermetabolic RUL lung mass on recent PET presents today for CT guided RUL lung mass biopsy. Details/risks of procedure d/w pt/family with their understanding and consent.  Crissie Sickles Allred 10/31/2013, 8:21 AM

## 2013-10-31 NOTE — Discharge Instructions (Signed)
Needle Biopsy of Lung, Care After Refer to this sheet in the next few weeks. These instructions provide you with information on caring for yourself after your procedure. Your health care provider may also give you more specific instructions. Your treatment has been planned according to current medical practices, but problems sometimes occur. Call your health care provider if you have any problems or questions after your procedure. WHAT TO EXPECT AFTER THE PROCEDURE A bandage will be applied over the areas where the needle was inserted. You may be asked to apply pressure to the bandage for several minutes to ensure there is minimal bleeding. In most cases, you can leave when your needle biopsy procedure is completed. Do not drive yourself home. Someone else should take you home. If you received an IV sedative or general anesthetic, you will be taken to a comfortable place to relax while the medication wears off. If you have upcoming travel scheduled, talk to your doctor about when it is safe to travel by air after the procedure. HOME CARE INSTRUCTIONS Expect to take it easy for the rest of the day. Protect the area where you received the needle biopsy by keeping the bandage in place for as long as instructed. You may feel some mild pain or discomfort in the area, but this should stop in a day or two. Only take over-the-counter or prescription medicines for pain, discomfort, or fever as directed by your caregiver. SEEK MEDICAL CARE IF:   You have pain at the biopsy site that worsens or is not helped by medication.  You have swelling or drainage at the needle biopsy site.  You have a fever. SEEK IMMEDIATE MEDICAL CARE IF:   You have new or worsening shortness of breath.  You have chest pain.  You are coughing up blood.  You have bleeding that does not stop with pressure or a bandage.  You develop light-headedness or fainting. Document Released: 05/04/2007 Document Revised: 03/09/2013 Document  Reviewed: 11/29/2012 [facility] Patient Information 2014 [place].

## 2013-11-01 ENCOUNTER — Telehealth: Payer: Self-pay | Admitting: Radiation Oncology

## 2013-11-01 NOTE — Telephone Encounter (Signed)
Called pt to inform of positive biopsy. Left message on cellphonethat we could schedule radiation treatment. Placed order for sim.

## 2013-11-08 NOTE — Progress Notes (Signed)
Pacemaker form received from dr.Gregg Turner's office.Copy given to Verdie Drown to scan in [place].

## 2013-11-09 ENCOUNTER — Ambulatory Visit
Admission: RE | Admit: 2013-11-09 | Discharge: 2013-11-09 | Disposition: A | Discharge status: Home / Self Care | Payer: Medicare Other | Source: Ambulatory Visit | Attending: Radiation Oncology | Admitting: Radiation Oncology

## 2013-11-09 VITALS — BP 158/65 | HR 81 | Temp 97.7°F | Wt 212.3 lb

## 2013-11-09 DIAGNOSIS — C3430 Malignant neoplasm of lower lobe, unspecified bronchus or lung: Secondary | ICD-10-CM

## 2013-11-09 DIAGNOSIS — R911 Solitary pulmonary nodule: Secondary | ICD-10-CM | POA: Insufficient documentation

## 2013-11-09 NOTE — Progress Notes (Signed)
Please see the Nurse Progress Note in the MD Initial Consult Encounter for this patient. 

## 2013-11-09 NOTE — Progress Notes (Addendum)
[place] [facility] Simulation and Treatment Planning Note   Name: Velton Roselle MRN: [id number]  Date: 11/09/2013  DOB: [birth year]  Status: outpatient  DIAGNOSIS: There were no encounter diagnoses.  SIDE: right  CONSENT VERIFIED: yes  SET UP AND IMMOBILIZATION: Patient is setup supine in a vac loc with a custom moldable pillow for head and neck immobilization   NARRATIVE: The patient was brought to the [facility].  Identity was confirmed.  All relevant records and images related to the planned course of therapy were reviewed.  Then, the patient was positioned in a stable reproducible clinical set-up for radiation therapy. REspiratory compression was placed. CT images were obtained.  Skin markings were placed.     MOTION MANAGEMENT NOTE: A four dimensional simulation was then performed to track tumor movement throughout the patients' breathing cycle. The CT images were loaded into the planning software where the target and avoidance structures were contoured.  The GTV was outlined on the free breathing, 4D and MIP image sets.  The radiation prescription was entered and confirmed.   TREATMENT PLANNING NOTE:  Treatment planning then occurred. I have requested 3D simulation with [facility] of the spinal cord, total lungs and gross tumor volume. I have also requested mlcs and an isodose plan.   Special treatment procedure will be performed as [name] will be receiving high dose per fraction.

## 2013-11-09 NOTE — Progress Notes (Signed)
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  He had his biopsy returned out to be a stage I squamous cell carcinoma. He is now ready to proceed on with radiation.  Allergies:  Allergies  Allergen Reactions  . Testosterone Swelling    In legs    Medications:  Current Outpatient Prescriptions  Medication Sig Dispense Refill  . Ascorbic Acid (VITAMIN C) 500 MG tablet Take 500 mg by mouth daily.        [name] atorvastatin (LIPITOR) 80 MG tablet Take 80 mg by mouth daily.       . beta carotene w/minerals (OCUVITE) tablet Take 1 tablet by mouth daily.       . budesonide-formoterol (SYMBICORT) 160-4.5 MCG/ACT inhaler Inhale 2 puffs into the lungs 2 (two) times daily.      . Cholecalciferol (VITAMIN D PO) Take 1 tablet by mouth daily.       [name] co-enzyme Q-10 30 MG capsule Take 100 mg by mouth 4 (four) times daily.      [name] diltiazem (CARDIZEM CD) 180 MG 24 hr capsule TAKE 1 CAPSULE BY MOUTH DAILY  30 capsule  6  . fenofibrate 54 MG tablet Take 54 mg by mouth Daily.       [name] FOLIC ACID PO Take 1 tablet by mouth daily.       . furosemide (LASIX) 80 MG tablet 80 in the am and 40  In the pm      . GRAPE SEED EXTRACT PO Take 1 capsule by mouth 2 (two) times daily.      . Lycopene 10 MG CAPS Take 10 mg by mouth daily.      . Misc Natural Products (BLACK CHERRY CONCENTRATE PO) Take 2 capsules by mouth daily.      . Multiple Vitamin (MULTIVITAMIN PO) Take 1 tablet by mouth daily.       [name] NITROSTAT 0.4 MG SL tablet Place 0.4 mg under the tongue every 5 (five) minutes as needed for chest pain. Take as directed      . Omega-3 Fatty Acids (FISH OIL) 1000 MG CAPS Take 3,000 mg by mouth daily.       [name] OVER THE COUNTER MEDICATION Take 4-5 oz by mouth every evening.  "Diet tonic water"- quinine      . pantoprazole (PROTONIX) 40 MG tablet Take 40 mg by mouth daily.       . potassium chloride (KLOR-CON 10) 10 MEQ CR tablet Take 10 mEq by mouth daily.        . saw palmetto 160 MG capsule Take 160 mg by mouth daily. Takes 5 capsules daily      . SELENIUM PO Take 1 tablet by mouth daily.       [name] tiotropium (SPIRIVA) 18 MCG inhalation capsule Place 18 mcg into inhaler and inhale daily.      . TURMERIC PO Take 1 capsule by mouth daily.      [name] warfarin (COUMADIN) 2.5 MG tablet Take 5 mg by mouth daily at 6 PM. Take as directed       No current facility-administered medications for this encounter.    Physical Exam:  Filed Vitals:   11/09/13 1448  BP: 158/65  Pulse: 81  Temp: 97.7 F (36.5 C)  Weight: 212 lb 4.8 oz (96.299 kg)  SpO2: 98%   pleasant male in no distress sitting comfortably examining table. Normal respiratory effort.  IMPRESSION: [name] Gill is a 80 y.o. male status post biopsy with a T1 N0 squamous cell carcinoma  PLAN:  We discussed the process of simulation the placement tattoos. We discussed the use of for dimensional simulation for creation of a ITP. We discussed the possibility of rib fracture. We discussed followup plans. His daughter [name] Gill present for this discussion. He signed informed consent and will proceed on with simulation.    Thea Silversmith, MD

## 2013-11-09 NOTE — Progress Notes (Signed)
Patient and family here to review ct biopsy results from 10/31/13 reveals invasive squamous cell carcinoma. Pacemaker for completed and returned from cardiologist has been scanned in and copy place in chart.

## 2013-11-11 NOTE — Addendum Note (Signed)
Encounter addended by: Arlyss Repress, RN on: 11/11/2013  1:38 PM<BR>     Documentation filed: Charges VN

## 2013-11-22 ENCOUNTER — Encounter: Payer: Self-pay | Admitting: Radiation Oncology

## 2013-11-22 ENCOUNTER — Ambulatory Visit
Admission: RE | Admit: 2013-11-22 | Discharge: 2013-11-22 | Disposition: A | Discharge status: Home / Self Care | Payer: Medicare Other | Source: Ambulatory Visit | Attending: Radiation Oncology | Admitting: Radiation Oncology

## 2013-11-22 VITALS — BP 128/70 | HR 81 | Temp 97.7°F | Wt 213.2 lb

## 2013-11-22 DIAGNOSIS — C3430 Malignant neoplasm of lower lobe, unspecified bronchus or lung: Secondary | ICD-10-CM

## 2013-11-22 DIAGNOSIS — C3431 Malignant neoplasm of lower lobe, right bronchus or lung: Secondary | ICD-10-CM | POA: Insufficient documentation

## 2013-11-22 HISTORY — DX: Malignant neoplasm of lower lobe, unspecified bronchus or lung: C34.30

## 2013-11-22 NOTE — Progress Notes (Signed)
  Radiation Oncology         [phone] ________________________________  Name: [name] Gill MRN: [id number]  Date: 11/22/2013  DOB: [birth year]  Stereotactic Body Radiotherapy Treatment Procedure Note (Fraction 1/5)  NARRATIVE:  [name] Gill was brought to the stereotactic radiation treatment machine and placed supine on the CT couch. The patient was set up for stereotactic body radiotherapy on the body fix pillow.  3D TREATMENT PLANNING AND DOSIMETRY:  The patient's radiation plan was reviewed and approved prior to starting treatment.  It showed 3-dimensional radiation distributions overlaid onto the planning CT.  The [facility] for the target structures as well as the organs at risk were reviewed. The documentation of this is filed in the radiation oncology EMR.  SIMULATION VERIFICATION:  The patient underwent CT imaging on the treatment unit.  These were carefully aligned to document that the ablative radiation dose would cover the target volume and maximally spare the nearby organs at risk according to the planned distribution.  SPECIAL TREATMENT PROCEDURE: [name] Gill received high dose ablative stereotactic body radiotherapy to the planned target volume without unforeseen complications. Treatment was delivered uneventfully. The high doses associated with stereotactic body radiotherapy and the significant potential risks require careful treatment set up and patient monitoring constituting a special treatment procedure   STEREOTACTIC TREATMENT MANAGEMENT:  Following delivery, the patient was evaluated clinically. The patient tolerated treatment without significant acute effects, and was discharged to home in stable condition.    PLAN: Continue treatment as planned.  _________________________   Thea Silversmith, MD

## 2013-11-22 NOTE — Progress Notes (Signed)
Weekly Management Note Current Dose: 12  Gy  Projected Dose: 60 Gy   Narrative:  The patient presents for routine under treatment assessment.  CBCT/MVCT images/Port film x-rays were reviewed.  The chart was checked. Doing well. No complaints.   Physical Findings: Weight: 213 lb 3.2 oz (96.707 kg). Unchanged  Impression:  The patient is tolerating radiation.  Plan:  Continue treatment as planned.

## 2013-11-22 NOTE — Progress Notes (Signed)
Routine of clinic reviewed.Completed 1 of 5 SBRT treatments to right upper lung on every other day routine.Denies pain, shortness of breath or cough.

## 2013-11-24 ENCOUNTER — Ambulatory Visit
Admission: RE | Admit: 2013-11-24 | Discharge: 2013-11-24 | Disposition: A | Discharge status: Home / Self Care | Payer: Medicare Other | Source: Ambulatory Visit | Attending: Radiation Oncology | Admitting: Radiation Oncology

## 2013-11-24 ENCOUNTER — Encounter: Payer: Self-pay | Admitting: Internal Medicine

## 2013-11-24 ENCOUNTER — Ambulatory Visit (INDEPENDENT_AMBULATORY_CARE_PROVIDER_SITE_OTHER): Payer: Medicare Other | Admitting: Internal Medicine

## 2013-11-24 VITALS — BP 126/60 | HR 86 | Ht 66.0 in | Wt 216.6 lb

## 2013-11-24 DIAGNOSIS — C3430 Malignant neoplasm of lower lobe, unspecified bronchus or lung: Secondary | ICD-10-CM

## 2013-11-24 DIAGNOSIS — R911 Solitary pulmonary nodule: Secondary | ICD-10-CM

## 2013-11-24 DIAGNOSIS — G4733 Obstructive sleep apnea (adult) (pediatric): Secondary | ICD-10-CM

## 2013-11-24 DIAGNOSIS — I2510 Atherosclerotic heart disease of native coronary artery without angina pectoris: Secondary | ICD-10-CM

## 2013-11-24 DIAGNOSIS — D490 Neoplasm of unspecified behavior of digestive system: Secondary | ICD-10-CM

## 2013-11-24 NOTE — Progress Notes (Signed)
  Radiation Oncology         [phone] ________________________________  Name: [name] Gill MRN: [id number]  Date: 11/24/2013  DOB: 08/29/1925  Stereotactic Body Radiotherapy Treatment Procedure Note  NARRATIVE:  [name] Gill was brought to the stereotactic radiation treatment machine and placed supine on the CT couch. The patient was set up for stereotactic body radiotherapy on the body fix pillow.  3D TREATMENT PLANNING AND DOSIMETRY:  The patient's radiation plan was reviewed and approved prior to starting treatment.  It showed 3-dimensional radiation distributions overlaid onto the planning CT.  The [facility] for the target structures as well as the organs at risk were reviewed. The documentation of this is filed in the radiation oncology EMR.  SIMULATION VERIFICATION:  The patient underwent CT imaging on the treatment unit.  These were carefully aligned to document that the ablative radiation dose would cover the target volume and maximally spare the nearby organs at risk according to the planned distribution.  SPECIAL TREATMENT PROCEDURE: [name] Gill received high dose ablative stereotactic body radiotherapy to the planned target volume without unforeseen complications. Treatment was delivered uneventfully. The high doses associated with stereotactic body radiotherapy and the significant potential risks require careful treatment set up and patient monitoring constituting a special treatment procedure   STEREOTACTIC TREATMENT MANAGEMENT:  Following delivery, the patient was evaluated clinically. The patient tolerated treatment without significant acute effects, and was discharged to home in stable condition.    PLAN: Continue treatment as planned.  _________________________   Thea Silversmith, MD

## 2013-11-24 NOTE — Progress Notes (Signed)
[name] Lever, MD 11/18/2011 9:09 PM Pended  Patient ID: [name] Gill, male DOB: [birth year], 79 y.o. MRN: [id number]  HPI  43 yoM followed for OSA and COPD complicated by CAD and AFib/ pacemaker. with obesity hypoventilation syndrome. Last here May 15, 2010. Since then he finished Pulmonary Rehab and says it was life changing. Pacing himself he can now walk around the block with a few rest stops. We talked about ways to maintain the momentum. Otherwise quiet winter- had one respiratory infection- not bad. Heart ok- no new problems or changes by Dr Radford Pax. FEV1 was 1.35/ 58% in 2011. Dr Felipa Eth did CXR during the winter.  He continues BIPAP 11/9 with O2 2 L/M, used at least 5-6 hours every night.  Denies routine cough, phlegm, chest pain, palpitation, bloody or purulent sputum.   05/16/11- 36 yoM followed for OSA and COPD complicated by CAD and AFib/ pacemaker, obesity hypoventilation syndrome.  Wife is here. Had flu shot and shingles vaccine. He feels he is doing very well. Pulmonary rehabilitation was a big help and he has lost 28 pounds since April. He is now in the maintenance program. Comfortable off of oxygen in the daytime. Using Spiriva and Symbicort. Never aware of wheeze. Denies cough or chest pain. He has an old nebulizer machine and rescue inhaler that he never needs. He doesn't notice that he wheezes.  He continues BiPAP 11/9 with oxygen at 2 L, every night for at least 5 or 6 hours. His wife comments that he now stays asleep on long drives and she thinks he is doing very well.   11/14/11- 52 yoM followed for OSA and COPD complicated by CAD and AFib/ pacemaker, obesity hypoventilation syndrome.  He continues to use BiPAP with inspiratory pressure 11/expiratory pressure 9 and says he is doing well with this.  He considers his COPD status "wonderful". Pulmonary rehabilitation as a big help and has made a significant difference in his strength over the past year. Has lost some weight.  Little cough or wheeze. Sleeps with oxygen 2 L. Using Symbicort twice daily and Spiriva. Occasional epistaxis and nasal stuffiness attributed to old nasal trauma.  11/18/11- 46 yoM followed for OSA and COPD complicated by CAD and AFib/ pacemaker, obesity hypoventilation syndrome.  PCP Dr Felipa Eth Just here 4 days ago. Wife brings him in today because he didn't tell me at last visit about recent problems she wanted me to know about. Oxygen saturation had been running 96-98% but has been lower in the past few days. Malaise. Had a gastroenteritis syndrome February 27 and similar March 21. In the last few days has felt tired, chills, feet swelling, stomach bloating. More short of breath off and on. Denies pain, fever, purulent discharge, leg pain or cramps. Was taking AndroGel but stopped last week. Has been eating a lot of licorice, up to 20 sticks/ day over the past month, initially to treat constipation. Then he just kept on because he liked it. Has history of mild renal insufficiency. Has been eating a banana a day and potassium supplement. He reports that the pulmonary rehabilitation nurse thought he might be fluid overloaded. In the last 2 days he had been eating boullion, accepting salt input in order to lose weight by avoiding sandwiches.  05/21/12- 10 yoM followed for OSA and COPD complicated by CAD and AFib/ pacemaker, obesity hypoventilation syndrome.  PCP Dr Felipa Eth   had flu vaccine Feels no longer needs O2-been about 4-5 months since used last; is having severe dry mouth(caused  tooth problems); would like to have another mask (not full face mask-[employer]). Had lost weight and since then has not used oxygen at all. Treated for atrial fibrillation with pacemaker. Doing very well and pulmonary rehabilitation program. Does not require oxygen during that exercise. Continues BiPAP 11/9/[employer] with no humidifier. I discussed the humidifier given his concern about dry mouth. COPD assessment test (CAT)  3/40  11/24/12 86 yoM followed for OSA and COPD complicated by CAD and AFib/ pacemaker, obesity hypoventilation syndrome     PCP Dr Felipa Eth FOLLOWS FOR: patient reports he is feeling much better with his breathing esp after losing 60lbs-- c/o excessive dry mouth--wearing BiPAP11/9  every night for approx 4 hrs per night- tolerating pressure ok He had lost weight, about 25 pounds by our scale while working in pulmonary rehabilitation. He admits he is gained some back. He is now going to [employer]. Denies cough wheeze, edema or chest pain. Using a nasal decongestant spray about twice daily and we discussed this, recommending he cut way down. CXR 11/19/11 IMPRESSION:  Mild basilar predominant pulmonary edema and small bilateral  effusions.  Original Report Authenticated By: Duayne Cal, M.D.  05/27/13- 27 yoM former smoker hefollowed for OSA and COPD complicated by CAD and AFib/ pacemaker, obesity hypoventilation syndrome     PCP Dr Felipa Eth FOLLOWS FOR:  Pt states he did have "cold" and was recently treated with abx. Wears BiPAP  11/9every night for about 4-5 hours; gets extreme dry mouth-has tried biotene since last visit and helps slightly. DME is [employer]. Dryness accentuated by furosemide and Spiriva. Still doing "Silver sneakers" As he was getting over a recent viral pattern cold syndrome he found a lymph node left neck -not tender CXR 5/ 7 / 14 IMPRESSION:  Mild chronic lung disease with bibasilar scarring. No acute  cardiopulmonary abnormality.  Original Report Authenticated By: Carl Best, M.D.  10/06/13- 75 yoM former smoker followed for OSA and COPD complicated by CAD and AFib/ pacemaker, obesity hypoventilation syndrome     PCP Dr Caleen Essex and wife here. L parotid lesion being followed by Dr Lucia Gaskins ENT. Work-up led to CXR and CT chest- RUL nodule w/ central cavitation. PET positive w/o evident mets. We reviewed images together. He denies unusual dyspnea, chest pain, cough  or wheeze and family concurs. No blood and no nodes or masses except L parotid.  He has f/u appt tomorrow w/ Dr Radford Pax Cardiology. BIPAP 11/9 with O2 2L for sleep/ [employer].  CT neck 09/07/13 IMPRESSION:  1. Within the left parotid gland primarily in the superficial lobe  there are 2 adjacent circumscribed soft tissue masses. The larger  more inferior measures up to 22 mm diameter. 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  2. No regional lymphadenopathy. No other neck mass.  3. There is a partially visible 13 mm right lung nodule, not evident  on the 11/24/2012 chest radiographs. Recommend followup chest CT (IV  contrast preferred but could be omitted if the patient has any  history of renal insufficiency) to evaluate persistence and/or  characterize further.  4. 15 mm right thyroid nodule, significance doubtful in this age  group.  Electronically Signed  By: Lars Pinks M.D.  On: 09/07/2013 12:20 CT CHEST WITH CONTRAST 09/20/13 IMPRESSION:  1. Centrally cavitary right upper lobe pulmonary nodule. Suspicious  for primary bronchogenic carcinoma. This is superimposed upon mild  to moderate centrilobular emphysema. Consider further evaluation  with PET.  2. Probable scarring at the left apex. This is somewhat nodular more  medially. This could be re-evaluated at followup PET.  3. Cardiomegaly with small bilateral pleural effusions, suggesting a  component of congestive heart failure/fluid overload.  4. Thoracic adenopathy. This is nonspecific in the setting of  congestive heart failure. Could be secondary. However, metastatic  disease cannot be excluded.  5. Pulmonary artery enlargement suggests pulmonary arterial  hypertension.  6. Cholelithiasis.  7. Incompletely imaged transverse colon containing ventral abdominal  wall hernia.  8. Indeterminate right-sided thyroid nodule. This could  be further  evaluated with thyroid ultrasound. However, given patient age and  other comorbidities, of questionable significance.  Electronically Signed  By: Abigail Miyamoto M.D.  On: 09/23/2013 10:17 PET 09/27/13 IMPRESSION:  1. Hypermetabolism corresponding to the cavitary posterior right  upper lobe lung nodule. Most consistent with primary bronchogenic  carcinoma. The thoracic nodes are not hypermetabolic and are  decreased in size. There were likely related to a congestive heart  failure exacerbation. Therefore, presuming non-small-cell histology,  most consistent with T1aN0M0 or stage IA.  2. Hypermetabolic left parotid nodules. 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 Please see neck CT report of 09/07/2013.  3. Hypermetabolism at the left side of the vallecula. Favored to be  physiologic and related to secretions in this area. This area is  underdistended. This could be re-evaluated on follow-up PET or if  there is a clinical concern of mucosal lesion, direct visualization  performed.  4. Incidental findings, including common iliac artery dilatation,  gallstones, and ventral abdominal wall hernias.  Electronically Signed  By: Abigail Miyamoto M.D.  On: 10/04/2013 16:31  Review of Systems-See HPI Constitutional:   +Deliberate weight loss,   No-night sweats, fevers, chills, fatigue, lassitude. HEENT:   No-  headaches, difficulty swallowing, tooth/dental problems, sore throat,       No-  sneezing, itching, ear ache, +nasal congestion, post nasal drip,  CV:  No-   chest pain, orthopnea, PND, No-swelling in lower extremities,  No-anasarca, dizziness, palpitations Resp: +  shortness of breath with exertion or at rest.              No-   productive cough,  No non-productive cough,  No- coughing up of blood.              No-   change in color of mucus.  No- wheezing.   Skin: No-   rash or lesions. GI:  No-   heartburn, indigestion, abdominal pain, nausea, vomiting,   GU:  MS:  No-    joint pain or swelling.   Neuro-     nothing unusual Psych:  No- change in mood or affect. No depression or anxiety.  No memory loss.  PFT 01/11/10- moderate obstructive airways disease, insignificant response to bronchodilator. FVC 2.28/ 62%, FEV1 1.35/ 58%, FEV1/FVC 0.59, TLC 90%, DLCO 57%  11/24/13- 87 yoM former smoker followed for OSA, COPD, cavitary lung nodule/ Sq Cell Ca/ XRT, complicated by CAD and AFib/ pacemaker, obesity hypoventilation syndrome     PCP Dr Felipa Eth FOLLOWS FOR:  Pt reports breathing is doing well and has no concerns today Gill bx of RUL nodule + squamous cell Ca on 4/17. Has started XRT.  Breathing is comfortable with little cough or wheeze now. She no longer has oxygen for sleep BiPAP 11/9-[employer] all night every night  ROS-see HPI Constitutional:   No-   weight loss, night sweats, fevers, chills, fatigue, lassitude. HEENT:  No-  headaches, difficulty swallowing, tooth/dental problems, sore throat,       No-  sneezing, itching, ear ache, nasal congestion, post nasal drip,  CV:  No-   chest pain, orthopnea, PND, swelling in lower extremities, anasarca,                                  dizziness, palpitations Resp: No-   shortness of breath with exertion or at rest.              No-   productive cough,  No non-productive cough,  No- coughing up of blood.              No-   change in color of mucus.  No- wheezing.   Skin: No-   rash or lesions. GI:  No-   heartburn, indigestion, abdominal pain, nausea, vomiting,  GU:  MS:  No-   joint pain or swelling.   Neuro-     nothing unusual Psych:  No- change in mood or affect. No depression or anxiety.  No memory loss.  Objective:   Physical Exam General- Alert, Oriented, Affect-appropriate, Distress- none acute. Obese. Skin- rash-none, lesions- none, excoriation- none Lymphadenopathy-  +mass at angle L jaw Head- atraumatic            Eyes- Gross vision intact, PERRLA, conjunctivae clear secretions             Ears- +Hearing aid            Nose- Clear, no-Septal dev, mucus, polyps, erosion, perforation             Throat- Mallampati III-IV , mucosa clear/ not very dry , drainage- none, tonsils- atrophic. Neck- flexible , trachea midline, no stridor , thyroid nl, carotid no bruit Chest - symmetrical excursion , unlabored           Heart/CV- IRR , no murmur , no gallop  , no rub, nl s1 s2                           - JVD +1cm , edema- none, stasis changes- none, varices- none           Lung- clear,  cough- none , dullness-none, rub- none           Chest wall- +L pacemaker Abd-  Br/ Gen/ Rectal- Not done, not indicated Extrem- cyanosis- none, clubbing, none, atrophy- none, strength- nl Neuro- grossly intact to observation

## 2013-11-24 NOTE — Patient Instructions (Signed)
Ok to continue Symbicort, and to use your own albuterol rescue inhaler 2 puffs, up to 4 x daily, if needed  We are continuing Bipap 11/9  [employer]  I hope the radiation therapy goes well  Please call as needed

## 2013-11-28 ENCOUNTER — Ambulatory Visit
Admission: RE | Admit: 2013-11-28 | Discharge: 2013-11-28 | Disposition: A | Discharge status: Home / Self Care | Payer: Medicare Other | Source: Ambulatory Visit | Attending: Radiation Oncology | Admitting: Radiation Oncology

## 2013-11-28 DIAGNOSIS — C3430 Malignant neoplasm of lower lobe, unspecified bronchus or lung: Secondary | ICD-10-CM

## 2013-11-28 DIAGNOSIS — R911 Solitary pulmonary nodule: Secondary | ICD-10-CM

## 2013-11-28 NOTE — Progress Notes (Signed)
  Radiation Oncology         (336) [phone] ________________________________  Name: [name] Gill MRN: [id number]  Date: 11/28/2013  DOB: 23-Oct-1925  Stereotactic Body Radiotherapy Treatment Procedure Note  CURRENT FRACTION:    3  PLANNED FRACTIONS:  5  NARRATIVE:  [name] Gill was brought to the stereotactic radiation treatment machine and placed supine on the CT couch. The patient was set up for stereotactic body radiotherapy on the body fix pillow.  3D TREATMENT PLANNING AND DOSIMETRY:  The patient's radiation plan was reviewed and approved prior to starting treatment.  It showed 3-dimensional radiation distributions overlaid onto the planning CT.  The [facility] for the target structures as well as the organs at risk were reviewed. The documentation of this is filed in the radiation oncology EMR.  SIMULATION VERIFICATION:  The patient underwent CT imaging on the treatment unit.  These were carefully aligned to document that the ablative radiation dose would cover the target volume and maximally spare the nearby organs at risk according to the planned distribution.  SPECIAL TREATMENT PROCEDURE: [name] Gill received high dose ablative stereotactic body radiotherapy to the planned target volume without unforeseen complications. Treatment was delivered uneventfully. The high doses associated with stereotactic body radiotherapy and the significant potential risks require careful treatment set up and patient monitoring constituting a special treatment procedure   STEREOTACTIC TREATMENT MANAGEMENT:  Following delivery, the patient was evaluated clinically. The patient tolerated treatment without significant acute effects, and was discharged to home in stable condition.    PLAN: Continue treatment as planned.  ________________________________  Sheral Apley. Tammi Klippel, M.D.

## 2013-11-29 ENCOUNTER — Ambulatory Visit: Payer: Medicare Other | Admitting: Radiation Oncology

## 2013-11-30 ENCOUNTER — Ambulatory Visit
Admission: RE | Admit: 2013-11-30 | Discharge: 2013-11-30 | Disposition: A | Discharge status: Home / Self Care | Payer: Medicare Other | Source: Ambulatory Visit | Attending: Radiation Oncology | Admitting: Radiation Oncology

## 2013-11-30 DIAGNOSIS — C3430 Malignant neoplasm of lower lobe, unspecified bronchus or lung: Secondary | ICD-10-CM

## 2013-11-30 NOTE — Progress Notes (Signed)
  Radiation Oncology         (336) [phone] ________________________________  Name: Overton Boggus MRN: [id number]  Date: 11/30/2013  DOB: January 19, 1926  Stereotactic Body Radiotherapy Treatment Procedure Note ( 4 of 5 planned)  NARRATIVE:  [name] was brought to the stereotactic radiation treatment machine and placed supine on the CT couch. The patient was set up for stereotactic body radiotherapy on the body fix pillow.  3D TREATMENT PLANNING AND DOSIMETRY:  The patient's radiation plan was reviewed and approved prior to starting treatment.  It showed 3-dimensional radiation distributions overlaid onto the planning CT.  The [facility] for the target structures as well as the organs at risk were reviewed. The documentation of this is filed in the radiation oncology EMR.  SIMULATION VERIFICATION:  The patient underwent CT imaging on the treatment unit.  These were carefully aligned to document that the ablative radiation dose would cover the target volume and maximally spare the nearby organs at risk according to the planned distribution.  SPECIAL TREATMENT PROCEDURE: [name] received high dose ablative stereotactic body radiotherapy to the planned target volume without unforeseen complications. Treatment was delivered uneventfully. The high doses associated with stereotactic body radiotherapy and the significant potential risks require careful treatment set up and patient monitoring constituting a special treatment procedure   STEREOTACTIC TREATMENT MANAGEMENT:  Following delivery, the patient was evaluated clinically. The patient tolerated treatment without significant acute effects, and was discharged to home in stable condition.    PLAN: Continue treatment as planned.  ________________________________  Blair Promise, PhD, MD

## 2013-12-01 ENCOUNTER — Ambulatory Visit: Payer: Medicare Other | Admitting: Radiation Oncology

## 2013-12-02 ENCOUNTER — Encounter: Payer: Self-pay | Admitting: Radiation Oncology

## 2013-12-02 ENCOUNTER — Ambulatory Visit
Admission: RE | Admit: 2013-12-02 | Discharge: 2013-12-02 | Disposition: A | Discharge status: Home / Self Care | Payer: Medicare Other | Source: Ambulatory Visit | Attending: Radiation Oncology | Admitting: Radiation Oncology

## 2013-12-02 DIAGNOSIS — C3430 Malignant neoplasm of lower lobe, unspecified bronchus or lung: Secondary | ICD-10-CM

## 2013-12-02 NOTE — Progress Notes (Signed)
  Radiation Oncology (336) [phone]  ________________________________  Stereotactic Body Radiotherapy Treatment Procedure Note ( 5 of 5 planned)   NARRATIVE: [name] Gill was brought to the stereotactic radiation treatment machine and placed supine on the CT couch. The patient was set up for stereotactic body radiotherapy on the body fix pillow.   3D TREATMENT PLANNING AND DOSIMETRY: The patient's radiation plan was reviewed and approved prior to starting treatment. It showed 3-dimensional radiation distributions overlaid onto the planning CT. The [facility] for the target structures as well as the organs at risk were reviewed. The documentation of this is filed in the radiation oncology EMR.   SIMULATION VERIFICATION: The patient underwent CT imaging on the treatment unit. These were carefully aligned to document that the ablative radiation dose would cover the target volume and maximally spare the nearby organs at risk according to the planned distribution.   SPECIAL TREATMENT PROCEDURE: [name] Gill received high dose ablative stereotactic body radiotherapy to the planned target volume without unforeseen complications. Treatment was delivered uneventfully. The high doses associated with stereotactic body radiotherapy and the significant potential risks require careful treatment set up and patient monitoring constituting a special treatment procedure   STEREOTACTIC TREATMENT MANAGEMENT: Following delivery, the patient was evaluated clinically. The patient tolerated treatment without significant acute effects, and was discharged to home in stable condition.   PLAN: Followup in one month.  ------------------------------------------------  Jodelle Gross, MD, PhD

## 2013-12-06 ENCOUNTER — Other Ambulatory Visit: Payer: Self-pay | Admitting: Cardiology

## 2013-12-06 NOTE — Progress Notes (Signed)
  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   Narrative: The patient tolerated radiation treatment relatively well.   He had no ill effects from treatment.   Plan: The patient has completed radiation treatment. The patient will return to radiation oncology clinic for routine followup in one month. I advised them to call or return sooner if they have any questions or concerns related to their recovery or treatment.  ------------------------------------------------  Thea Silversmith, MD

## 2013-12-06 NOTE — Telephone Encounter (Signed)
Do you know if this patient is still on couamdin?

## 2013-12-07 ENCOUNTER — Ambulatory Visit (INDEPENDENT_AMBULATORY_CARE_PROVIDER_SITE_OTHER): Payer: Medicare Other | Admitting: Pharmacist

## 2013-12-07 DIAGNOSIS — I4891 Unspecified atrial fibrillation: Secondary | ICD-10-CM

## 2013-12-07 DIAGNOSIS — Z5181 Encounter for therapeutic drug level monitoring: Secondary | ICD-10-CM

## 2013-12-07 LAB — POCT INR: INR: 4.6

## 2013-12-07 MED ORDER — WARFARIN SODIUM 2.5 MG PO TABS: ORAL_TABLET | ORAL | Status: DC

## 2013-12-13 ENCOUNTER — Encounter (HOSPITAL_COMMUNITY): Payer: Self-pay | Admitting: Pharmacy Technician

## 2013-12-14 ENCOUNTER — Other Ambulatory Visit: Payer: Self-pay | Admitting: Otolaryngology

## 2013-12-14 ENCOUNTER — Encounter (HOSPITAL_COMMUNITY): Payer: Self-pay

## 2013-12-14 ENCOUNTER — Encounter (HOSPITAL_COMMUNITY)
Admission: RE | Admit: 2013-12-14 | Discharge: 2013-12-14 | Disposition: A | Discharge status: Home / Self Care | Payer: Medicare Other | Source: Ambulatory Visit | Attending: Anesthesiology | Admitting: Anesthesiology

## 2013-12-14 ENCOUNTER — Encounter (HOSPITAL_COMMUNITY)
Admission: RE | Admit: 2013-12-14 | Discharge: 2013-12-14 | Disposition: A | Discharge status: Home / Self Care | Payer: Medicare Other | Source: Ambulatory Visit | Attending: Otolaryngology | Admitting: Otolaryngology

## 2013-12-14 LAB — APTT: aPTT: 33 seconds (ref 24–37)

## 2013-12-14 LAB — CBC
HEMATOCRIT: 37 % — AB (ref 39.0–52.0)
Hemoglobin: 11.3 g/dL — ABNORMAL LOW (ref 13.0–17.0)
MCH: 26.5 pg (ref 26.0–34.0)
MCHC: 30.5 g/dL (ref 30.0–36.0)
MCV: 86.7 fL (ref 78.0–100.0)
Platelets: 261 10*3/uL (ref 150–400)
RBC: 4.27 MIL/uL (ref 4.22–5.81)
RDW: 18.6 % — AB (ref 11.5–15.5)
WBC: 5.7 10*3/uL (ref 4.0–10.5)

## 2013-12-14 LAB — BASIC METABOLIC PANEL
BUN: 30 mg/dL — AB (ref 6–23)
CHLORIDE: 101 meq/L (ref 96–112)
CO2: 29 mEq/L (ref 19–32)
Calcium: 9.6 mg/dL (ref 8.4–10.5)
Creatinine, Ser: 1.45 mg/dL — ABNORMAL HIGH (ref 0.50–1.35)
GFR calc Af Amer: 49 mL/min — ABNORMAL LOW (ref >90)
GFR, EST NON AFRICAN AMERICAN: 42 mL/min — AB (ref >90)
Glucose, Bld: 111 mg/dL — ABNORMAL HIGH (ref 70–99)
Potassium: 4.4 mEq/L (ref 3.7–5.3)
Sodium: 143 mEq/L (ref 137–147)

## 2013-12-14 LAB — PROTIME-INR
INR: 1.1 (ref 0.00–1.49)
Prothrombin Time: 14 seconds (ref 11.6–15.2)

## 2013-12-14 MED ORDER — CEFAZOLIN SODIUM-DEXTROSE 2-3 GM-% IV SOLR
2.0000 g | INTRAVENOUS | Status: AC
  Administered 2013-12-15: 2 g via INTRAVENOUS
  Filled 2013-12-14: qty 50

## 2013-12-14 NOTE — Progress Notes (Signed)
Pt denies SOB and chest pain, pt is under the care of Dr.Traci Turner ( cardiology). Pt stated that he stopped Coumadin on Saturday 12/10/13 as instructed by MD. Damaris Schooner with Sharee Pimple from [employer] to make aware that procedure will likely interfere with device function....... Sharee Pimple made aware that pt is scheduled to arrive at 5:30 AM for 7:30 procedure in the AM.

## 2013-12-14 NOTE — H&P (Signed)
PREOPERATIVE H&P  Chief Complaint: left parotid mass  HPI: [name] Gill is a 80 y.o. male who presents for evaluation of left parotid mass that has gradually gotten larger. CT scan demonstrated an approximate 2.5 cm mass within the tail of the left parotid gland. PET scan of a lung nodule showed hyperactivity within the parotid mass also. FNA of the parotid mass did not demonstrate any malignant cells. He's taken to the OR for left superficial parotidectomy to remove the parotid mass.  Past Medical History  Diagnosis Date  . Atrial fibrillation     coumadin, pacemaker/ CHRONIC  . Diverticulitis 1975  . Hypertension   . COPD (chronic obstructive pulmonary disease)     FEV1 1.35/58% 01-11-10  . Pacemaker -[employer]     [employer]  . Ventricular tachycardia, non-sustained     Detected on the device  . Hyperlipidemia     goal LDL less than 70  . PVD (peripheral vascular disease)     w mod left external carotid artery stenosis.   . Obesity   . Ichthyosis   . Renal insufficiency     stage III   . Hyperglycemia     mild glucose 111 on 11/2010  . H/O echocardiogram     nl LVF w severe ASSH and mod cLVH, mildly dilated aorta, milf LAE, mild MR/TR and moderate AVSC  . Incisional hernia     2 upper and 1 lower abdominal incisional hernia.   . Low testosterone     start androgel  . Tachycardia-bradycardia syndrome     s/p PPM  . Chronic anticoagulation   . CHD (coronary heart disease)     s/p CABG with patents grafts at cath 2009  . Aortic root dilatation   . CHF (congestive heart failure)   . Obesity   . Cataract      no surgery  . GERD (gastroesophageal reflux disease)   . Dysrhythmia     afib  . OSA (obstructive sleep apnea)     04-04-08 NPSG AHI 14.5, 3L/M suppl O2 for nadir 86%  . H/O hiatal hernia   . Arthritis   . Anemia     hx  . Malignant neoplasm of lower lobe, bronchus, or lung 11/22/2013   Past Surgical History  Procedure Laterality Date  . Broken nose    .  Pacemaker insertion  02/2008    [employer]  . Coronary artery bypass graft  6063,0160    coronary disease s/p CABG 2004 and Cath 2009  . Cardiac catheterization  2009  . Pacemaker insertion  11/14  . Colon surgery  85    diverticulitis  . Back surgery      herniated disc  . Hernia repair     History   Social History  . Marital Status: Married    Spouse Name: N/A    Number of Children: N/A  . Years of Education: N/A   Occupational History  . retired     [employer]   Social History Main Topics  . Smoking status: Former Smoker -- 2.00 packs/day for 55 years    Types: Cigarettes    Quit date: 10/19/1997  . Smokeless tobacco: Never Used  . Alcohol Use: Yes     Comment: yes "rarely"  . Drug Use: No  . Sexual Activity: Not on file   Other Topics Concern  . Not on file   Social History Narrative  . No narrative on file   Family History  Problem Relation  Age of Onset  . Tuberculosis Father     arrested  . Cancer Father   . Aortic stenosis Mother     ASCVD   Allergies  Allergen Reactions  . Testosterone Swelling    In legs   Prior to Admission medications   Medication Sig Start Date End Date Taking? Authorizing Provider  Ascorbic Acid (VITAMIN C) 500 MG tablet Take 500 mg by mouth daily.     Yes Historical Provider, MD  atorvastatin (LIPITOR) 80 MG tablet Take 80 mg by mouth daily.  04/26/12  Yes Historical Provider, MD  beta carotene w/minerals (OCUVITE) tablet Take 1 tablet by mouth daily.    Yes Historical Provider, MD  budesonide-formoterol (SYMBICORT) 160-4.5 MCG/ACT inhaler Inhale 2 puffs into the lungs 2 (two) times daily.   Yes Historical Provider, MD  Cholecalciferol (VITAMIN D PO) Take 1 tablet by mouth daily.    Yes Historical Provider, MD  co-enzyme Q-10 30 MG capsule Take 2 capsules by mouth 2 (two) times daily.    Yes Historical Provider, MD  diltiazem (DILACOR XR) 180 MG 24 hr capsule Take 180 mg by mouth daily.   Yes Historical Provider, MD   fenofibrate 54 MG tablet Take 54 mg by mouth Daily.    Yes Historical Provider, MD  FOLIC ACID PO Take 1 tablet by mouth daily.    Yes Historical Provider, MD  furosemide (LASIX) 80 MG tablet Take 40-80 mg by mouth 2 (two) times daily. 80mg  in the morning; 40mg  in the afternoon   Yes Historical Provider, MD  GRAPE SEED EXTRACT PO Take 1 capsule by mouth 2 (two) times daily.   Yes Historical Provider, MD  Lycopene 10 MG CAPS Take 10 mg by mouth daily.   Yes Historical Provider, MD  Misc Natural Products (BLACK CHERRY CONCENTRATE PO) Take 2 capsules by mouth daily.   Yes Historical Provider, MD  Multiple Vitamin (MULTIVITAMIN PO) Take 1 tablet by mouth daily.    Yes Historical Provider, MD  NITROSTAT 0.4 MG SL tablet Place 0.4 mg under the tongue every 5 (five) minutes as needed for chest pain. Take as directed   Yes Historical Provider, MD  Omega-3 Fatty Acids (FISH OIL) 1000 MG CAPS Take 3,000 mg by mouth daily.    Yes Historical Provider, MD  OVER THE COUNTER MEDICATION Take 4-5 oz by mouth every evening. "Diet tonic water"- quinine   Yes Historical Provider, MD  pantoprazole (PROTONIX) 40 MG tablet Take 40 mg by mouth daily.    Yes Historical Provider, MD  potassium chloride (KLOR-CON 10) 10 MEQ CR tablet Take 10 mEq by mouth daily.     Yes Historical Provider, MD  RESVERATROL PO Take 1 capsule by mouth 2 (two) times daily.   Yes Historical Provider, MD  saw palmetto 160 MG capsule Take 800 mg by mouth daily. Takes 5 capsules daily   Yes Historical Provider, MD  SELENIUM PO Take 1 tablet by mouth daily.    Yes Historical Provider, MD  tiotropium (SPIRIVA) 18 MCG inhalation capsule Place 18 mcg into inhaler and inhale daily.   Yes Historical Provider, MD  TURMERIC PO Take 1 capsule by mouth daily.   Yes Historical Provider, MD  warfarin (COUMADIN) 2.5 MG tablet Take 2.5-5 mg by mouth daily. 2.5mg  Wednesday and Friday; 5mg  the rest of the week    Historical Provider, MD     Positive ROS: no  pain associated with the mass  All other systems have been reviewed and were  otherwise negative with the exception of those mentioned in the HPI and as above.  Physical Exam: There were no vitals filed for this visit.  General: Alert, no acute distress Oral: Normal oral mucosa and tonsils Nasal: Clear nasal passages Neck: No palpable adenopathy.  2.5 cm left parotid mass in the tail region. Normal facial nerve function. Ear: Ear canal is clear with normal appearing TMs Cardiovascular: IRR, no murmur. Chronic a fib. Has pacemaker. Respiratory: Clear to auscultation Neurologic: Alert and oriented x 3   Assessment/Plan: LEFT PAROTID MASS Plan for Procedure(s): SUPEFICIAL PAROTIDECTOMY WITH FACIAL NERVE DISSECTION (left)   [name], MD 12/14/2013 11:55 AM

## 2013-12-14 NOTE — Pre-Procedure Instructions (Addendum)
[name] Gill  12/14/2013   Your procedure is scheduled on: Thursday, Dec 15, 2013  Report to [place] Stay (use Main Entrance "A'') at 5:30 AM.  Call this number if you have problems the morning of surgery: [phone]   Remember:   Do not eat food or drink liquids after midnight.   Take these medicines the morning of surgery with A SIP OF WATER:diltiazem (DILACOR XR),   pantoprazole (PROTONIX), budesonide-formoterol (SYMBICORT), tiotropium (SPIRIVA), if needed: Nitrostat for chest pain Stop taking Aspirin, Coumadin, vitamins and herbal medications ( fish oil  and etc.)Do not take any NSAIDs ie: Ibuprofen, Advil, Naproxen or any medication containing Aspirin.  Do not wear jewelry, make-up or nail polish.  Do not wear lotions, powders, or perfumes. You may wear deodorant.  Do not shave 48 hours prior to surgery. Men may shave face and neck.  Do not bring valuables to the hospital.  [facility] is not responsible  for any belongings or valuables.               Contacts, dentures or bridgework may not be worn into surgery.  Leave suitcase in the car. After surgery it may be brought to your room.  For patients admitted to the hospital, discharge time is determined by your  treatment team.               Patients discharged the day of surgery will not be allowed to drive home.  Name and phone number of your driver:   Special Instructions:  Special Instructions:Special Instructions: [facility] - Preparing for Surgery  Before surgery, you can play an important role.  Because skin is not sterile, your skin needs to be as free of germs as possible.  You can reduce the number of germs on you skin by washing with CHG (chlorahexidine gluconate) soap before surgery.  CHG is an antiseptic cleaner which kills germs and bonds with the skin to continue killing germs even after washing.  Please DO NOT use if you have an allergy to CHG or antibacterial soaps.  If your skin becomes  reddened/irritated stop using the CHG and inform your nurse when you arrive at Short Stay.  Do not shave (including legs and underarms) for at least 48 hours prior to the first CHG shower.  You may shave your face.  Please follow these instructions carefully:   1.  Shower with CHG Soap the night before surgery and the morning of Surgery.  2.  If you choose to wash your hair, wash your hair first as usual with your normal shampoo.  3.  After you shampoo, rinse your hair and body thoroughly to remove the Shampoo.  4.  Use CHG as you would any other liquid soap.  You can apply chg directly  to the skin and wash gently with scrungie or a clean washcloth.  5.  Apply the CHG Soap to your body ONLY FROM THE NECK DOWN.  Do not use on open wounds or open sores.  Avoid contact with your eyes, ears, mouth and genitals (private parts).  Wash genitals (private parts) with your normal soap.  6.  Wash thoroughly, paying special attention to the area where your surgery will be performed.  7.  Thoroughly rinse your body with warm water from the neck down.  8.  DO NOT shower/wash with your normal soap after using and rinsing off the CHG Soap.  9.  Pat yourself dry with a clean towel.  10.  Wear clean pajamas.            11.  Place clean sheets on your bed the night of your first shower and do not sleep with pets.  Day of Surgery  Do not apply any lotions the morning of surgery.  Please wear clean clothes to the hospital/surgery center.   Please read over the following fact sheets that you were given: Pain Booklet, Coughing and Deep Breathing and Surgical Site Infection Prevention

## 2013-12-14 NOTE — Progress Notes (Signed)
Anesthesia PAT Evaluation: Patient is an 80 year old male scheduled for superficial parotidectomy with facial nerve dissection on 12/15/13 by Dr. Melony Overly. Patient reports that this procedure was initially scheduled at the surgical center but the anesthesiologist requested that it be moved to the main hospital OR.  He also reports that Dr. Lucia Gaskins is planning to keep him admitted overnight.  History includes former smoker, CAD s/p CABG '09, CHF, atrial fibrillation, nonsustained ventricular tachycardia, tachy-brady syndrome, s/p removal of previous dual chamber PPM and insertion of [employer] single chamber pacemaker 06/01/13, COPD, hypertension, moderate left ICA stenosis (by notes), aortic root dilation (aortic root and ascending aorta documented as normal in size by echo 10/12/13), hyperlipidemia, hyperglycemia, CKD stage III, GERD, hiatal hernia, arthritis, obstructive sleep apnea with Bi-PAP (with naps and sleep), anemia, colon surgery for diverticulitis '75, history of nasal fracture, . BMI is ~ 35 consistent with obesity. PCP is Dr. Truddie Coco. Pulmonologist is Dr. Annamaria Boots. EP cardiologist is Dr. Lovena Le. Cardiologist is Dr. Fransico Him who saw him on 10/07/13 for preoperative clearance for bronchoscopy, right VATS, lung resection and was felt to be low risk from a cardiac standpoint. He ultimately decided not to proceed with this procedure.  He did undergo CT guides biopsy of RUL pulmonary nodule on 10/31/13 which showed invasive SCC and he has been having stereotactic radiation treatment with Dr. Lisbeth Renshaw, last treatment 12/06/13. His Coumadin has been on hold since 12/10/13.     EKG on 10/21/13 showed v-paced rhythm, underlying afib, pacer induced left BBB.   Echo on 10/12/13 showed: Normal LV size with moderate focal basal septal hypertrophy (no LV outflow gradient or mitral valve systolic anterior motion). EF 55-60% with basal inferior akinesis. Normal RV size and systolic function. Mild MR, trivial  TR.  Nuclear stress test on 05/28/11 showed: Normal myocardial perfusion scan demonstrating an attenuation artifact in the inferior region of the myocardium consistent with diaphragmatic attenuation. No ischemia or infarct/scar is seen in the remaining myocardium.  CXR on 12/14/13 showed: No active cardiopulmonary disease. Previously noted right upper to mid lung nodule is identified unchanged.  PFTs on 10/12/13 showed: FVC 2.65 (84%), FEV1 1.75 (81%), FEF25-75 0.83 (62%), TLC 5.30 (84%), DLCO unc 12.51 (46%). Mild airflow limitation. No significant response to BD. Decreased diffusion capacity.  Preoperative labs noted.  BUN/Cr 30/1.45 which appears within his baseline. H/H 11.3/37.0. PT/PTT WNL.  Patient evaluated at his PAT visit.  He denies any new CV symptoms since her last visit with Dr. Radford Pax on 10/07/13 in which she cleared him for lung resection with low cardiac risk. He did not have that surgery, but is now scheduled for parotidectomy.  He denies chest pain, SOB at rest or significant DOE.  He was able [place] walk [place]  He gets mild LE edema, which he feels is chronic.  On exam, his heart is irregularly irregular in the 80's.  Lungs were clear.  He had 1+ ankle edema.  No carotid bruits auscultated. Since his cardiologist saw him within the past three months and cleared him for thoracic surgery at that time, I would anticipate that he could proceed as planned since he denies any new CV/CHF symptoms.  George Hugh [facility] Short Stay Center/Anesthesiology Phone [phone] 12/14/2013 1:52 PM

## 2013-12-15 ENCOUNTER — Encounter (HOSPITAL_COMMUNITY): Payer: Medicare Other | Admitting: Vascular Surgery

## 2013-12-15 ENCOUNTER — Encounter (HOSPITAL_COMMUNITY): Payer: Self-pay | Admitting: *Deleted

## 2013-12-15 ENCOUNTER — Ambulatory Visit (HOSPITAL_COMMUNITY): Payer: Medicare Other | Admitting: Anesthesiology

## 2013-12-15 ENCOUNTER — Observation Stay (HOSPITAL_COMMUNITY)
Admission: RE | Admit: 2013-12-15 | Discharge: 2013-12-16 | Disposition: A | Discharge status: Home / Self Care | Payer: Medicare Other | Source: Ambulatory Visit | Attending: Otolaryngology | Admitting: Otolaryngology

## 2013-12-15 ENCOUNTER — Encounter (HOSPITAL_COMMUNITY)
Admission: RE | Disposition: A | Discharge status: Home / Self Care | Payer: Self-pay | Source: Ambulatory Visit | Attending: Otolaryngology

## 2013-12-15 DIAGNOSIS — Z951 Presence of aortocoronary bypass graft: Secondary | ICD-10-CM | POA: Insufficient documentation

## 2013-12-15 DIAGNOSIS — Z7901 Long term (current) use of anticoagulants: Secondary | ICD-10-CM | POA: Insufficient documentation

## 2013-12-15 DIAGNOSIS — E669 Obesity, unspecified: Secondary | ICD-10-CM | POA: Insufficient documentation

## 2013-12-15 DIAGNOSIS — D119 Benign neoplasm of major salivary gland, unspecified: Principal | ICD-10-CM | POA: Insufficient documentation

## 2013-12-15 DIAGNOSIS — I129 Hypertensive chronic kidney disease with stage 1 through stage 4 chronic kidney disease, or unspecified chronic kidney disease: Secondary | ICD-10-CM | POA: Insufficient documentation

## 2013-12-15 DIAGNOSIS — E785 Hyperlipidemia, unspecified: Secondary | ICD-10-CM | POA: Insufficient documentation

## 2013-12-15 DIAGNOSIS — Z87891 Personal history of nicotine dependence: Secondary | ICD-10-CM | POA: Insufficient documentation

## 2013-12-15 DIAGNOSIS — N183 Chronic kidney disease, stage 3 unspecified: Secondary | ICD-10-CM | POA: Insufficient documentation

## 2013-12-15 DIAGNOSIS — Z950 Presence of cardiac pacemaker: Secondary | ICD-10-CM | POA: Insufficient documentation

## 2013-12-15 DIAGNOSIS — Z01812 Encounter for preprocedural laboratory examination: Secondary | ICD-10-CM | POA: Insufficient documentation

## 2013-12-15 DIAGNOSIS — G4733 Obstructive sleep apnea (adult) (pediatric): Secondary | ICD-10-CM | POA: Insufficient documentation

## 2013-12-15 DIAGNOSIS — C3430 Malignant neoplasm of lower lobe, unspecified bronchus or lung: Secondary | ICD-10-CM | POA: Insufficient documentation

## 2013-12-15 DIAGNOSIS — J4489 Other specified chronic obstructive pulmonary disease: Secondary | ICD-10-CM | POA: Insufficient documentation

## 2013-12-15 DIAGNOSIS — J449 Chronic obstructive pulmonary disease, unspecified: Secondary | ICD-10-CM | POA: Insufficient documentation

## 2013-12-15 DIAGNOSIS — Z01818 Encounter for other preprocedural examination: Secondary | ICD-10-CM | POA: Insufficient documentation

## 2013-12-15 DIAGNOSIS — K219 Gastro-esophageal reflux disease without esophagitis: Secondary | ICD-10-CM | POA: Insufficient documentation

## 2013-12-15 DIAGNOSIS — I4891 Unspecified atrial fibrillation: Secondary | ICD-10-CM | POA: Insufficient documentation

## 2013-12-15 DIAGNOSIS — K118 Other diseases of salivary glands: Secondary | ICD-10-CM | POA: Diagnosis present

## 2013-12-15 DIAGNOSIS — I2510 Atherosclerotic heart disease of native coronary artery without angina pectoris: Secondary | ICD-10-CM | POA: Insufficient documentation

## 2013-12-15 DIAGNOSIS — I509 Heart failure, unspecified: Secondary | ICD-10-CM | POA: Insufficient documentation

## 2013-12-15 HISTORY — PX: PAROTIDECTOMY: SHX2163

## 2013-12-15 SURGERY — EXCISION, PAROTID GLAND
Anesthesia: General | Site: Face | Laterality: Left

## 2013-12-15 MED ORDER — NITROGLYCERIN 0.4 MG SL SUBL: 0.4000 mg | SUBLINGUAL_TABLET | SUBLINGUAL | Status: DC | PRN

## 2013-12-15 MED ORDER — BUDESONIDE-FORMOTEROL FUMARATE 160-4.5 MCG/ACT IN AERO
2.0000 | INHALATION_SPRAY | Freq: Two times a day (BID) | RESPIRATORY_TRACT | Status: DC
  Administered 2013-12-15 – 2013-12-16 (×2): 2 via RESPIRATORY_TRACT
  Filled 2013-12-15: qty 6

## 2013-12-15 MED ORDER — BACITRACIN ZINC 500 UNIT/GM EX OINT
TOPICAL_OINTMENT | CUTANEOUS | Status: DC | PRN
  Administered 2013-12-15: 1 via TOPICAL

## 2013-12-15 MED ORDER — SUCCINYLCHOLINE CHLORIDE 20 MG/ML IJ SOLN
INTRAMUSCULAR | Status: DC | PRN
  Administered 2013-12-15: 120 mg via INTRAVENOUS

## 2013-12-15 MED ORDER — MIDAZOLAM HCL 2 MG/2ML IJ SOLN
INTRAMUSCULAR | Status: AC
  Filled 2013-12-15: qty 2

## 2013-12-15 MED ORDER — ONDANSETRON HCL 4 MG/2ML IJ SOLN
INTRAMUSCULAR | Status: DC | PRN
  Administered 2013-12-15: 4 mg via INTRAVENOUS

## 2013-12-15 MED ORDER — PANTOPRAZOLE SODIUM 40 MG PO TBEC
40.0000 mg | DELAYED_RELEASE_TABLET | Freq: Every day | ORAL | Status: DC
  Administered 2013-12-16: 40 mg via ORAL
  Filled 2013-12-15: qty 1

## 2013-12-15 MED ORDER — LIDOCAINE HCL (CARDIAC) 20 MG/ML IV SOLN
INTRAVENOUS | Status: AC
  Filled 2013-12-15: qty 5

## 2013-12-15 MED ORDER — SUCCINYLCHOLINE CHLORIDE 20 MG/ML IJ SOLN
INTRAMUSCULAR | Status: AC
  Filled 2013-12-15: qty 1

## 2013-12-15 MED ORDER — CEFAZOLIN SODIUM 1-5 GM-% IV SOLN
1.0000 g | Freq: Three times a day (TID) | INTRAVENOUS | Status: AC
  Administered 2013-12-15 (×2): 1 g via INTRAVENOUS
  Filled 2013-12-15 (×2): qty 50

## 2013-12-15 MED ORDER — ARTIFICIAL TEARS OP OINT
TOPICAL_OINTMENT | OPHTHALMIC | Status: AC
  Filled 2013-12-15: qty 3.5

## 2013-12-15 MED ORDER — NEOSTIGMINE METHYLSULFATE 10 MG/10ML IV SOLN
INTRAVENOUS | Status: AC
  Filled 2013-12-15: qty 1

## 2013-12-15 MED ORDER — SODIUM CHLORIDE 0.9 % IV SOLN
10.0000 mg | INTRAVENOUS | Status: DC | PRN
  Administered 2013-12-15: 20 ug/min via INTRAVENOUS

## 2013-12-15 MED ORDER — FUROSEMIDE 40 MG PO TABS
40.0000 mg | ORAL_TABLET | Freq: Every day | ORAL | Status: DC
  Administered 2013-12-15: 40 mg via ORAL
  Filled 2013-12-15 (×2): qty 1

## 2013-12-15 MED ORDER — PROPOFOL 10 MG/ML IV BOLUS
INTRAVENOUS | Status: AC
  Filled 2013-12-15: qty 20

## 2013-12-15 MED ORDER — ONDANSETRON HCL 4 MG PO TABS: 4.0000 mg | ORAL_TABLET | ORAL | Status: DC | PRN

## 2013-12-15 MED ORDER — EPHEDRINE SULFATE 50 MG/ML IJ SOLN
INTRAMUSCULAR | Status: DC | PRN
  Administered 2013-12-15: 10 mg via INTRAVENOUS

## 2013-12-15 MED ORDER — HYDROMORPHONE HCL PF 1 MG/ML IJ SOLN
0.2500 mg | INTRAMUSCULAR | Status: DC | PRN
  Administered 2013-12-15 (×2): 0.5 mg via INTRAVENOUS

## 2013-12-15 MED ORDER — KCL IN DEXTROSE-NACL 20-5-0.45 MEQ/L-%-% IV SOLN
INTRAVENOUS | Status: DC
  Administered 2013-12-15 – 2013-12-16 (×2): via INTRAVENOUS
  Filled 2013-12-15 (×3): qty 1000

## 2013-12-15 MED ORDER — FUROSEMIDE 40 MG PO TABS: 40.0000 mg | ORAL_TABLET | Freq: Two times a day (BID) | ORAL | Status: DC

## 2013-12-15 MED ORDER — COENZYME Q10 30 MG PO CAPS: 60.0000 mg | ORAL_CAPSULE | Freq: Two times a day (BID) | ORAL | Status: DC

## 2013-12-15 MED ORDER — ROCURONIUM BROMIDE 50 MG/5ML IV SOLN
INTRAVENOUS | Status: AC
  Filled 2013-12-15: qty 1

## 2013-12-15 MED ORDER — GLYCOPYRROLATE 0.2 MG/ML IJ SOLN
INTRAMUSCULAR | Status: AC
  Filled 2013-12-15: qty 2

## 2013-12-15 MED ORDER — ONDANSETRON HCL 4 MG/2ML IJ SOLN: 4.0000 mg | INTRAMUSCULAR | Status: DC | PRN

## 2013-12-15 MED ORDER — PROPOFOL 10 MG/ML IV BOLUS
INTRAVENOUS | Status: DC | PRN
  Administered 2013-12-15: 80 mg via INTRAVENOUS

## 2013-12-15 MED ORDER — FENTANYL CITRATE 0.05 MG/ML IJ SOLN
INTRAMUSCULAR | Status: DC | PRN
  Administered 2013-12-15 (×3): 50 ug via INTRAVENOUS

## 2013-12-15 MED ORDER — PROPOFOL INFUSION 10 MG/ML OPTIME
INTRAVENOUS | Status: DC | PRN
  Administered 2013-12-15: 25 ug/kg/min via INTRAVENOUS

## 2013-12-15 MED ORDER — DILTIAZEM HCL ER 180 MG PO CP24
180.0000 mg | ORAL_CAPSULE | Freq: Every day | ORAL | Status: DC
  Administered 2013-12-16: 180 mg via ORAL
  Filled 2013-12-15: qty 1

## 2013-12-15 MED ORDER — TIOTROPIUM BROMIDE MONOHYDRATE 18 MCG IN CAPS
18.0000 ug | ORAL_CAPSULE | Freq: Every day | RESPIRATORY_TRACT | Status: DC
  Administered 2013-12-16: 18 ug via RESPIRATORY_TRACT
  Filled 2013-12-15: qty 5

## 2013-12-15 MED ORDER — HYDROCODONE-ACETAMINOPHEN 5-325 MG PO TABS
ORAL_TABLET | ORAL | Status: AC
  Filled 2013-12-15: qty 2

## 2013-12-15 MED ORDER — DEXAMETHASONE SODIUM PHOSPHATE 4 MG/ML IJ SOLN
INTRAMUSCULAR | Status: AC
  Filled 2013-12-15: qty 2

## 2013-12-15 MED ORDER — LIDOCAINE-EPINEPHRINE 1 %-1:100000 IJ SOLN
INTRAMUSCULAR | Status: AC
  Filled 2013-12-15: qty 1

## 2013-12-15 MED ORDER — 0.9 % SODIUM CHLORIDE (POUR BTL) OPTIME
TOPICAL | Status: DC | PRN
  Administered 2013-12-15: 1000 mL

## 2013-12-15 MED ORDER — LIDOCAINE HCL (CARDIAC) 20 MG/ML IV SOLN
INTRAVENOUS | Status: DC | PRN
  Administered 2013-12-15: 60 mg via INTRAVENOUS

## 2013-12-15 MED ORDER — FUROSEMIDE 80 MG PO TABS
80.0000 mg | ORAL_TABLET | Freq: Every day | ORAL | Status: DC
  Administered 2013-12-16: 80 mg via ORAL
  Filled 2013-12-15: qty 1

## 2013-12-15 MED ORDER — BACITRACIN ZINC 500 UNIT/GM EX OINT
TOPICAL_OINTMENT | CUTANEOUS | Status: AC
  Filled 2013-12-15: qty 15

## 2013-12-15 MED ORDER — LIDOCAINE-EPINEPHRINE 1 %-1:100000 IJ SOLN
INTRAMUSCULAR | Status: DC | PRN
  Administered 2013-12-15: 30 mL via INTRADERMAL

## 2013-12-15 MED ORDER — ARTIFICIAL TEARS OP OINT
TOPICAL_OINTMENT | OPHTHALMIC | Status: DC | PRN
  Administered 2013-12-15: 1 via OPHTHALMIC

## 2013-12-15 MED ORDER — LABETALOL HCL 5 MG/ML IV SOLN
INTRAVENOUS | Status: AC
  Filled 2013-12-15: qty 4

## 2013-12-15 MED ORDER — HYDROMORPHONE HCL PF 1 MG/ML IJ SOLN
INTRAMUSCULAR | Status: AC
  Administered 2013-12-15: 0.5 mg via INTRAVENOUS
  Filled 2013-12-15: qty 1

## 2013-12-15 MED ORDER — ACETAMINOPHEN 160 MG/5ML PO SOLN
650.0000 mg | ORAL | Status: DC | PRN
  Filled 2013-12-15: qty 20.3

## 2013-12-15 MED ORDER — HYDROCODONE-ACETAMINOPHEN 5-325 MG PO TABS
1.0000 | ORAL_TABLET | ORAL | Status: DC | PRN
  Administered 2013-12-15: 2 via ORAL

## 2013-12-15 MED ORDER — MORPHINE SULFATE 2 MG/ML IJ SOLN: 2.0000 mg | INTRAMUSCULAR | Status: DC | PRN

## 2013-12-15 MED ORDER — ONDANSETRON HCL 4 MG/2ML IJ SOLN
INTRAMUSCULAR | Status: AC
  Filled 2013-12-15: qty 2

## 2013-12-15 MED ORDER — PROMETHAZINE HCL 25 MG/ML IJ SOLN: 6.2500 mg | INTRAMUSCULAR | Status: DC | PRN

## 2013-12-15 MED ORDER — ACETAMINOPHEN 650 MG RE SUPP
650.0000 mg | RECTAL | Status: DC | PRN
  Filled 2013-12-15: qty 1

## 2013-12-15 MED ORDER — DEXAMETHASONE SODIUM PHOSPHATE 4 MG/ML IJ SOLN
INTRAMUSCULAR | Status: DC | PRN
  Administered 2013-12-15: 8 mg via INTRAVENOUS

## 2013-12-15 MED ORDER — FENTANYL CITRATE 0.05 MG/ML IJ SOLN
INTRAMUSCULAR | Status: AC
  Filled 2013-12-15: qty 5

## 2013-12-15 MED ORDER — LACTATED RINGERS IV SOLN
INTRAVENOUS | Status: DC | PRN
  Administered 2013-12-15: 07:00:00 via INTRAVENOUS

## 2013-12-15 SURGICAL SUPPLY — 54 items
ATTRACTOMAT 16X20 MAGNETIC DRP (DRAPES) IMPLANT
BANDAGE GAUZE ELAST BULKY 4 IN (GAUZE/BANDAGES/DRESSINGS) IMPLANT
BLADE 10 SAFETY STRL DISP (BLADE) ×3 IMPLANT
BLADE SURG 12 STRL SS (BLADE) ×3 IMPLANT
CANISTER SUCTION 2500CC (MISCELLANEOUS) ×3 IMPLANT
CLEANER TIP ELECTROSURG 2X2 (MISCELLANEOUS) ×3 IMPLANT
CONT SPEC 4OZ CLIKSEAL STRL BL (MISCELLANEOUS) ×3 IMPLANT
CORDS BIPOLAR (ELECTRODE) ×3 IMPLANT
COVER SURGICAL LIGHT HANDLE (MISCELLANEOUS) ×3 IMPLANT
DRAIN SNY 10 ROU (WOUND CARE) ×3 IMPLANT
DRAPE SURG 17X11 SM STRL (DRAPES) ×3 IMPLANT
ELECT COATED BLADE 2.86 ST (ELECTRODE) ×3 IMPLANT
ELECT REM PT RETURN 9FT ADLT (ELECTROSURGICAL) ×3
ELECTRODE REM PT RTRN 9FT ADLT (ELECTROSURGICAL) ×1 IMPLANT
EVACUATOR SILICONE 100CC (DRAIN) ×3 IMPLANT
GLOVE BIO SURGEON STRL SZ7 (GLOVE) ×3 IMPLANT
GLOVE BIO SURGEON STRL SZ7.5 (GLOVE) ×3 IMPLANT
GLOVE BIOGEL PI IND STRL 7.0 (GLOVE) ×1 IMPLANT
GLOVE BIOGEL PI INDICATOR 7.0 (GLOVE) ×2
GLOVE SS BIOGEL STRL SZ 7.5 (GLOVE) ×1 IMPLANT
GLOVE SUPERSENSE BIOGEL SZ 7.5 (GLOVE) ×2
GLOVE SURG SS PI 7.0 STRL IVOR (GLOVE) ×3 IMPLANT
GLOVE SURG SS PI 7.5 STRL IVOR (GLOVE) ×3 IMPLANT
GOWN STRL REUS W/ TWL LRG LVL3 (GOWN DISPOSABLE) ×1 IMPLANT
GOWN STRL REUS W/ TWL XL LVL3 (GOWN DISPOSABLE) ×1 IMPLANT
GOWN STRL REUS W/TWL LRG LVL3 (GOWN DISPOSABLE) ×2
GOWN STRL REUS W/TWL XL LVL3 (GOWN DISPOSABLE) ×2
GOWN STRL REUS W/TWL XL LVL4 (GOWN DISPOSABLE) ×3 IMPLANT
KIT BASIN OR (CUSTOM PROCEDURE TRAY) ×3 IMPLANT
KIT ROOM TURNOVER OR (KITS) ×3 IMPLANT
LOCATOR NERVE 3 VOLT (DISPOSABLE) IMPLANT
NS IRRIG 1000ML POUR BTL (IV SOLUTION) ×3 IMPLANT
PAD ARMBOARD 7.5X6 YLW CONV (MISCELLANEOUS) ×6 IMPLANT
PENCIL BUTTON HOLSTER BLD 10FT (ELECTRODE) ×3 IMPLANT
SPONGE GAUZE 4X4 12PLY (GAUZE/BANDAGES/DRESSINGS) ×3 IMPLANT
SPONGE INTESTINAL PEANUT (DISPOSABLE) IMPLANT
SPONGE LAP 18X18 X RAY DECT (DISPOSABLE) ×3 IMPLANT
SUT CHROMIC 3 0 PS 2 (SUTURE) ×3 IMPLANT
SUT ETHILON 5 0 P 3 18 (SUTURE) ×2
SUT ETHILON 8 0 BV130 4 (SUTURE) IMPLANT
SUT NYLON ETHILON 5-0 P-3 1X18 (SUTURE) ×1 IMPLANT
SUT SILK 2 0 (SUTURE) ×2
SUT SILK 2 0 FS (SUTURE) ×6 IMPLANT
SUT SILK 2-0 18XBRD TIE 12 (SUTURE) ×1 IMPLANT
SUT SILK 3 0 (SUTURE) ×2
SUT SILK 3-0 18XBRD TIE 12 (SUTURE) ×1 IMPLANT
SUT SILK 4 0 (SUTURE)
SUT SILK 4-0 18XBRD TIE 12 (SUTURE) IMPLANT
SUT VIC AB 3-0 FS2 27 (SUTURE) ×3 IMPLANT
TAPE CLOTH SURG 4X10 WHT LF (GAUZE/BANDAGES/DRESSINGS) ×3 IMPLANT
TOWEL OR 17X24 6PK STRL BLUE (TOWEL DISPOSABLE) ×3 IMPLANT
TOWEL OR 17X26 10 PK STRL BLUE (TOWEL DISPOSABLE) ×3 IMPLANT
TRAY ENT MC OR (CUSTOM PROCEDURE TRAY) ×3 IMPLANT
WATER STERILE IRR 1000ML POUR (IV SOLUTION) ×3 IMPLANT

## 2013-12-15 NOTE — Anesthesia Postprocedure Evaluation (Signed)
  Anesthesia Post-op Note  Patient: [name] Gill  Procedure(s) Performed: Procedure(s) (LRB): SUPEFICIAL PAROTIDECTOMY WITH FACIAL NERVE DISSECTION (Left)  Patient Location: PACU  Anesthesia Type: General  Level of Consciousness: awake and alert   Airway and Oxygen Therapy: Patient Spontanous Breathing  Post-op Pain: mild  Post-op Assessment: Post-op Vital signs reviewed, Patient's Cardiovascular Status Stable, Respiratory Function Stable, Patent Airway and No signs of Nausea or vomiting  Last Vitals:  Filed Vitals:   12/15/13 0945  BP: 156/74  Pulse: 75  Temp:   Resp: 19    Post-op Vital Signs: stable   Complications: No apparent anesthesia complications

## 2013-12-15 NOTE — Transfer of Care (Signed)
Immediate Anesthesia Transfer of Care Note  Patient: [name] Gill  Procedure(s) Performed: Procedure(s): SUPEFICIAL PAROTIDECTOMY WITH FACIAL NERVE DISSECTION (Left)  Patient Location: PACU  Anesthesia Type:General  Level of Consciousness: awake, alert , oriented and patient cooperative  Airway & Oxygen Therapy: Patient Spontanous Breathing and Patient connected to face mask oxygen  Post-op Assessment: Report given to PACU RN, Post -op Vital signs reviewed and stable and Patient moving all extremities X 4  Post vital signs: Reviewed and stable  Complications: No apparent anesthesia complications

## 2013-12-15 NOTE — Brief Op Note (Signed)
12/15/2013  9:15 AM  PATIENT:  [name] Gill  80 y.o. male  PRE-OPERATIVE DIAGNOSIS:  PAROTID MASS  POST-OPERATIVE DIAGNOSIS:  PAROTID MASS  PROCEDURE:  Procedure(s): SUPEFICIAL PAROTIDECTOMY WITH FACIAL NERVE DISSECTION (Left)  SURGEON:  Surgeon(s) and Role:    * [name], MD - Primary    * Thornell Sartorius, MD - Assisting  PHYSICIAN ASSISTANT:   ASSISTANTS: Minna Merritts   ANESTHESIA:   general  EBL:     BLOOD ADMINISTERED:none  DRAINS: (10) Jackson-Pratt drain(s) with closed bulb suction in the left parotid region   LOCAL MEDICATIONS USED:  LIDOCAINE with EPI  8 cc  SPECIMEN:  Source of Specimen:  left superficial parotid mass  DISPOSITION OF SPECIMEN:  PATHOLOGY  COUNTS:  YES  TOURNIQUET:  * No tourniquets in log *  DICTATION: .Other Dictation: Dictation Number [id number]  PLAN OF CARE: Admit for overnight observation  PATIENT DISPOSITION:  PACU - hemodynamically stable.   Delay start of Pharmacological VTE agent (>24hrs) due to surgical blood loss or risk of bleeding: yes

## 2013-12-15 NOTE — Anesthesia Preprocedure Evaluation (Addendum)
Anesthesia Evaluation  Patient identified by MRN, date of birth, ID band Patient awake    Reviewed: Allergy & Precautions, H&P , NPO status , Patient's Chart, lab work & pertinent test results  Airway Mallampati: II TM Distance: >3 FB Neck ROM: Full    Dental no notable dental hx.    Pulmonary sleep apnea , COPDformer smoker,  breath sounds clear to auscultation  Pulmonary exam normal       Cardiovascular hypertension, Pt. on medications + CAD and + CABG + dysrhythmias Atrial Fibrillation + pacemaker Rhythm:Irregular Rate:Normal - Systolic murmurs  Left ventricle: The cavity size was normal. There was   moderate focal basal hypertrophy of the septum. Systolic   function was normal. The estimated ejection fraction was   in the range of 55% to 60%. Basal inferior akinesis.   Indeterminant diastolic function. - Aortic valve: Trileaflet; moderately calcified leaflets.   There was no stenosis.    Neuro/Psych negative neurological ROS  negative psych ROS   GI/Hepatic negative GI ROS, Neg liver ROS,   Endo/Other  negative endocrine ROSMorbid obesity  Renal/GU Renal InsufficiencyRenal disease  negative genitourinary   Musculoskeletal negative musculoskeletal ROS (+)   Abdominal   Peds negative pediatric ROS (+)  Hematology negative hematology ROS (+)   Anesthesia Other Findings   Reproductive/Obstetrics negative OB ROS                          Anesthesia Physical Anesthesia Plan  ASA: III  Anesthesia Plan: General   Post-op Pain Management:    Induction: Intravenous  Airway Management Planned: Oral ETT  Additional Equipment: Arterial line  Intra-op Plan:   Post-operative Plan: Extubation in OR  Informed Consent: I have reviewed the patients History and Physical, chart, labs and discussed the procedure including the risks, benefits and alternatives for the proposed anesthesia with the  patient or authorized representative who has indicated his/her understanding and acceptance.   Dental advisory given  Plan Discussed with: CRNA and Surgeon  Anesthesia Plan Comments:         Anesthesia Quick Evaluation

## 2013-12-15 NOTE — Progress Notes (Signed)
Post op check Doing well. AF VSS Drain has come out most of the way and was removed. No swelling. Good nerve function. Plan to discharge in AM.

## 2013-12-15 NOTE — Interval H&P Note (Signed)
History and Physical Interval Note:  12/15/2013 7:25 AM  [name] Gill  has presented today for surgery, with the diagnosis of PAROTID MASS  The various methods of treatment have been discussed with the patient and family. After consideration of risks, benefits and other options for treatment, the patient has consented to  Procedure(s): [place] (N/A) as a surgical intervention .  The patient's history has been reviewed, patient examined, no change in status, stable for surgery.  I have reviewed the patient's chart and labs.  Questions were answered to the patient's satisfaction.     [name]

## 2013-12-15 NOTE — Progress Notes (Signed)
PHARMACIST - PHYSICIAN ORDER COMMUNICATION  CONCERNING: P&T Medication Policy on Herbal Medications  DESCRIPTION:  This patient's order for:  Co-Q-10  has been noted.  This product(s) is classified as an "herbal" or natural product. Due to a lack of definitive safety studies or FDA approval, nonstandard manufacturing practices, plus the potential risk of unknown drug-drug interactions while on inpatient medications, the Pharmacy and Therapeutics Committee does not permit the use of "herbal" or natural products of this type within .   ACTION TAKEN: The pharmacy department is unable to verify this order at this time. Please reevaluate patient's clinical condition at discharge and address if the herbal or natural product(s) should be resumed at that time.   

## 2013-12-16 MED ORDER — HYDROCODONE-ACETAMINOPHEN 5-325 MG PO TABS: 1.0000 | ORAL_TABLET | Freq: Four times a day (QID) | ORAL | Status: DC | PRN

## 2013-12-16 NOTE — Op Note (Signed)
NAMEAMERIGO, [name] NO.:  [id number]  MEDICAL RECORD NO.:  [id number]  LOCATION:  6N24C                        FACILITY:  [facility]  PHYSICIAN:  Leonides Sake. Lucia Gaskins, M.D.DATE OF BIRTH:  1926-05-04  DATE OF PROCEDURE:  12/15/2013 DATE OF DISCHARGE:                              OPERATIVE REPORT   PREOPERATIVE DIAGNOSIS:  Left parotid mass.  POSTOPERATIVE DIAGNOSIS:  Left parotid mass.  OPERATION:  Left superficial parotidectomy with facial nerve dissection and removal of a left parotid mass.  SURGEON:  Leonides Sake. Lucia Gaskins, M.D.  ASSISTANT SURGEON:  Minna Merritts, M.D.  ANESTHESIA:  General endotracheal.  COMPLICATIONS:  None.  BRIEF CLINICAL NOTE:  [name] Gill is an 80 year old gentleman who has had a slowly enlarging left parotid mass for over a year.  On examination and CT scan, this demonstrated a 2.5 cm parotid mass in the tail of left parotid gland with a smaller mass adjacent to the larger parotid mass.  This had moderate amount of activity on PET scan.  On evaluation of the parotid mass, he was noted to have a lung nodule consistent with bronchogenic carcinoma and he has undergone treatment for this.  The masses had elevated activity on PET scan.  He has normal facial nerve function.  Fine Gill aspirate of the mass showed no evidence of malignancy.  He is taken to the operating room at this time for a left superficial parotidectomy with facial nerve dissection and removal of left parotid mass.  DESCRIPTION OF PROCEDURE:  After adequate endotracheal anesthesia, the patient received 2 g Ancef IV preoperatively.  The left face was prepped with Betadine solution and draped in a sterile towels.  A standard curvilinear parotid incision was made.  The flaps were elevated anteriorly over the parotid fascia, and then posteriorly down to the sternocleidomastoid muscle.  On CT scan, the masses were adjacent to the sternocleidomastoid muscle and the  tail of the parotid gland.  Following this, dissection was carried down anteriorly to the ear cartilage and identified the styloid process and the facial nerve just inferior to the styloid process in normal anatomical position.  The lower division of the facial nerve was dissected out and the mass was posterior to the lower division of the facial nerve.  Bipolar cautery was used for hemostasis and 3-0 silk ligatures were used for tying off of few of the larger veins.  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  The mass was relatively soft to palpation.  The superficial posterior portion of the parotid gland was totally excised.  This was sent to Pathology.  Hemostasis was obtained with bipolar cautery.  The wound was irrigated and closed with 3-0 chromic sutures subcutaneously and 5-0 nylon sutures to reapproximate the skin edges.  A #10 JP bulbs dissection was brought out through a separate stab incision posteriorly to drain the parotid bed.  This completed the procedure.  [name] Gill was awoken from anesthesia and transferred to recovery room, postoperatively doing well.  DISPOSITION:  He will be observed overnight in the hospital and planned  on removing the JP drain and discharging him home in the morning.  He received perioperative Ancef.  I will follow him up in my office in 1 week to have sutures removed and review final pathology.          ______________________________ Leonides Sake Lucia Gaskins, M.D.     CEN/MEDQ  D:  12/15/2013  T:  12/16/2013  Job:  [id number]  cc:   Hal T. Stoneking, M.D. Minna Merritts, M.D.

## 2013-12-16 NOTE — Discharge Instructions (Signed)
Resume your previous meds and restart your coumadin OK to get incision site wet starting tomorrow. Apply antibiotic ointment to incision site daily. Call office for follow up appt. In 6-7 days  [phone]

## 2013-12-16 NOTE — Progress Notes (Signed)
Discussed discharge summary with pt. Pt received Rx. Pt did not have any further questions. Pt waiting for daughter to come pick him up.

## 2013-12-16 NOTE — Addendum Note (Signed)
Encounter addended by: Thea Silversmith, MD on: 12/16/2013 10:39 AM<BR>     Documentation filed: Notes Section

## 2013-12-16 NOTE — Progress Notes (Signed)
POD 1 AF VSS Wound doing well without swelling. Good facial n function Drain removed yesterday evening Discharge home on regular meds. Restart coumadin. Will call office for follow up in 6-7 days. Discharge dictated [phone]

## 2013-12-17 NOTE — Discharge Summary (Signed)
NAMESHEDRICK, SARLI NO.:  [id number]  MEDICAL RECORD NO.:  [id number]  LOCATION:  6N24C                        FACILITY:  [facility]  PHYSICIAN:  Leonides Sake. Lucia Gaskins, M.D.DATE OF BIRTH:  Sep 21, 1925  DATE OF ADMISSION:  12/15/2013 DATE OF DISCHARGE:  12/16/2013                              DISCHARGE SUMMARY   ADMITTING DIAGNOSES: 1. Left parotid mass. 2. Chronic paroxysmal atrial fibrillation. 3. Coronary artery disease. 4. Recent diagnosis of bronchogenic carcinoma. 5. Chronic obstructive pulmonary disease.  The patient was admitted to the hospital on Dec 15, 2013 and discharged on Dec 16, 2013.  OPERATIONS DURING THIS HOSPITALIZATION:  Left superficial parotidectomy with facial nerve dissection and removal of left parotid mass on Dec 15, 2013.  HOSPITAL COURSE:  Braylyn Kalter was admitted via the operating room on Dec 15, 2013, at which time, he underwent a left superficial parotidectomy with facial nerve dissection and excision of a left parotid mass.  The patient was admitted for observation because of health issues as well as monitor Jackson-Pratt drain.  He tolerated the procedure well, received perioperative antibiotics Ancef.  The JP drain was accidentally removed on the evening of Dec 15, 2013, completed removal later that evening.  There was no significant swelling or drainage involving the wound or surgery.  He had good facial nerve function and received postoperative Ancef.  On the day of discharge, he was afebrile, wound was doing well with no significant swelling.  He had good facial nerve function and was subsequently discharged home on Dec 16, 2013.  DISPOSITION:  The patient was discharged home on his regular medications.  He will restart his Coumadin that was held 5 days preoperatively.  We will continue with his regular medications and he was given hydrocodone to take p.r.n. pain in addition to Tylenol or Motrin if needed.  We will  have him follow up in my office in 6-7 days for recheck, have sutures removed, review final pathology.          ______________________________ Leonides Sake Lucia Gaskins, M.D.     CEN/MEDQ  D:  12/16/2013  T:  12/17/2013  Job:  [id number]

## 2013-12-19 ENCOUNTER — Encounter (HOSPITAL_COMMUNITY): Payer: Self-pay | Admitting: Otolaryngology

## 2013-12-19 ENCOUNTER — Other Ambulatory Visit: Payer: Self-pay | Admitting: Internal Medicine

## 2013-12-22 ENCOUNTER — Ambulatory Visit (INDEPENDENT_AMBULATORY_CARE_PROVIDER_SITE_OTHER): Payer: Medicare Other | Admitting: *Deleted

## 2013-12-22 DIAGNOSIS — I495 Sick sinus syndrome: Secondary | ICD-10-CM

## 2013-12-23 NOTE — Progress Notes (Signed)
Remote pacemaker transmission.   

## 2013-12-26 LAB — MDC_IDC_ENUM_SESS_TYPE_REMOTE
Battery Remaining Longevity: 121 mo
Battery Voltage: 2.78 V
Brady Statistic RV Percent Paced: 48 %
Date Time Interrogation Session: 20150604172242
Lead Channel Pacing Threshold Amplitude: 0.5 V
Lead Channel Sensing Intrinsic Amplitude: 11.2 mV
Lead Channel Setting Pacing Pulse Width: 0.4 ms
MDC IDC MSMT BATTERY IMPEDANCE: 138 Ohm
MDC IDC MSMT LEADCHNL RA IMPEDANCE VALUE: 0 Ohm
MDC IDC MSMT LEADCHNL RV IMPEDANCE VALUE: 556 Ohm
MDC IDC MSMT LEADCHNL RV PACING THRESHOLD PULSEWIDTH: 0.4 ms
MDC IDC SET LEADCHNL RV PACING AMPLITUDE: 2 V
MDC IDC SET LEADCHNL RV SENSING SENSITIVITY: 5.6 mV

## 2013-12-27 ENCOUNTER — Ambulatory Visit (INDEPENDENT_AMBULATORY_CARE_PROVIDER_SITE_OTHER): Payer: Medicare Other | Admitting: *Deleted

## 2013-12-27 DIAGNOSIS — I4891 Unspecified atrial fibrillation: Secondary | ICD-10-CM

## 2013-12-27 DIAGNOSIS — Z5181 Encounter for therapeutic drug level monitoring: Secondary | ICD-10-CM

## 2013-12-27 LAB — POCT INR: INR: 2.6

## 2013-12-29 ENCOUNTER — Other Ambulatory Visit: Payer: Self-pay | Admitting: Internal Medicine

## 2013-12-30 ENCOUNTER — Encounter: Payer: Self-pay | Admitting: Cardiology

## 2014-01-02 NOTE — Assessment & Plan Note (Signed)
Followed by ENT

## 2014-01-02 NOTE — Assessment & Plan Note (Signed)
Good compliance and control with BiPAP 11/9

## 2014-01-02 NOTE — Assessment & Plan Note (Signed)
Confirmed squamous cell lung cancer, treatment begun

## 2014-01-02 NOTE — Assessment & Plan Note (Signed)
Started XRT, no surgery

## 2014-01-03 ENCOUNTER — Encounter: Payer: Self-pay | Admitting: Internal Medicine

## 2014-01-04 ENCOUNTER — Ambulatory Visit: Payer: Medicare Other | Admitting: Podiatry

## 2014-01-06 ENCOUNTER — Ambulatory Visit
Admission: RE | Admit: 2014-01-06 | Discharge: 2014-01-06 | Disposition: A | Discharge status: Home / Self Care | Payer: Medicare Other | Source: Ambulatory Visit | Attending: Radiation Oncology | Admitting: Radiation Oncology

## 2014-01-06 VITALS — BP 154/64 | HR 80 | Temp 97.4°F | Wt 214.5 lb

## 2014-01-06 DIAGNOSIS — C3430 Malignant neoplasm of lower lobe, unspecified bronchus or lung: Secondary | ICD-10-CM

## 2014-01-06 NOTE — Addendum Note (Signed)
Encounter addended by: Thea Silversmith, MD on: 01/06/2014  4:26 PM<BR>     Documentation filed: Dorothey Baseman Staging, Visit Diagnoses, Follow-up Section, LOS Section, Problem List, Notes Section

## 2014-01-06 NOTE — Progress Notes (Signed)
Patient for one month follow up completion of radiation to right upper lobe lung.Completed 5 fractions of 12 Gy each to total dose of 60 Gy.patient had a superficial parotidectomy dissection on 12/15/2013. Chest 2-view completed on 12/14/13 revealed no active disease.Denies pain, cough or shortness of breath.No residual effects from radiation.States the only problem he had was the feeling of tiredness of lower legs which resolved after one week.

## 2014-01-06 NOTE — Progress Notes (Signed)
Department of Radiation Oncology  Phone:  [phone] Fax:        [phone]   Name: Brnadon Eoff MRN: [id number]  DOB: 02/11/1926  Date: 01/06/2014  Follow Up Visit Note  Diagnosis: Stage I NSCLC of th right upper lobe  Summary and Interval since last radiation: 60 Gy in 5 fractions completed 12/15/13  Interval History: Ezel presents today for routine followup.  He is doing well. He had a Warthin's tumor removed from his left parotid. He is still recovering from that. His wife has shingles. He has no pain or increased shortness of breath. His pacemaker was interrogated on 6/4 and was functioning normally.   Allergies: No Active Allergies  Medications:  Current Outpatient Prescriptions  Medication Sig Dispense Refill  . Ascorbic Acid (VITAMIN C) 500 MG tablet Take 500 mg by mouth daily.        [name] atorvastatin (LIPITOR) 80 MG tablet Take 80 mg by mouth daily.       . beta carotene w/minerals (OCUVITE) tablet Take 1 tablet by mouth daily.       . budesonide-formoterol (SYMBICORT) 160-4.5 MCG/ACT inhaler Inhale 2 puffs into the lungs 2 (two) times daily.      . Cholecalciferol (VITAMIN D PO) Take 1 tablet by mouth daily.       [name] co-enzyme Q-10 30 MG capsule Take 2 capsules by mouth 2 (two) times daily.       [name] diltiazem (DILACOR XR) 180 MG 24 hr capsule Take 180 mg by mouth daily.      . fenofibrate 54 MG tablet Take 54 mg by mouth Daily.       [name] FOLIC ACID PO Take 1 tablet by mouth daily.       . furosemide (LASIX) 80 MG tablet Take 40-80 mg by mouth 2 (two) times daily. 80mg  in the morning; 40mg  in the afternoon      . GRAPE SEED EXTRACT PO Take 1 capsule by mouth 2 (two) times daily.      . Lycopene 10 MG CAPS Take 10 mg by mouth daily.      . Misc Natural Products (BLACK CHERRY CONCENTRATE PO) Take 2 capsules by mouth daily.      . Multiple Vitamin (MULTIVITAMIN PO) Take 1 tablet by mouth daily.       [name] NITROSTAT 0.4 MG SL tablet Place 0.4 mg under the tongue every 5 (five)  minutes as needed for chest pain. Take as directed      . Omega-3 Fatty Acids (FISH OIL) 1000 MG CAPS Take 3,000 mg by mouth daily.       [name] OVER THE COUNTER MEDICATION Take 4-5 oz by mouth every evening. "Diet tonic water"- quinine      . pantoprazole (PROTONIX) 40 MG tablet Take 40 mg by mouth daily.       . potassium chloride (KLOR-CON 10) 10 MEQ CR tablet Take 10 mEq by mouth daily.        [name] RESVERATROL PO Take 1 capsule by mouth 2 (two) times daily.      . saw palmetto 160 MG capsule Take 800 mg by mouth daily. Takes 5 capsules daily      . SELENIUM PO Take 1 tablet by mouth daily.       [name] SPIRIVA HANDIHALER 18 MCG inhalation capsule INHALE CONTENTS OF 1 CAPSULE BY MOUTH DAILY AS DIRECTED  90 capsule  1  . TURMERIC PO Take 1 capsule by mouth daily.      [name]  warfarin (COUMADIN) 2.5 MG tablet Take 2.5-5 mg by mouth daily. 2.5mg  Wednesday and Friday; 5mg  the rest of the week       No current facility-administered medications for this encounter.    Physical Exam:  Filed Vitals:   01/06/14 1552  BP: 154/64  Pulse: 80  Temp: 97.4 F (36.3 C)  Weight: 214 lb 8 oz (97.297 kg)  SpO2: 99%   Normal appearing. No distress. Alert.   IMPRESSION: Jeramie is a 80 y.o. male a/p SBRT with no acute effects of treatment  PLAN:  CT scan in 7-10 days for baseline. Follow up in 6 months with scan. Sooner prn.     Thea Silversmith, MD

## 2014-01-09 ENCOUNTER — Telehealth: Payer: Self-pay | Admitting: *Deleted

## 2014-01-09 ENCOUNTER — Ambulatory Visit (INDEPENDENT_AMBULATORY_CARE_PROVIDER_SITE_OTHER): Payer: Medicare Other | Admitting: Podiatry

## 2014-01-09 ENCOUNTER — Encounter: Payer: Self-pay | Admitting: Podiatry

## 2014-01-09 VITALS — BP 154/75 | HR 82 | Resp 17 | Ht 66.5 in | Wt 214.0 lb

## 2014-01-09 DIAGNOSIS — M79609 Pain in unspecified limb: Secondary | ICD-10-CM

## 2014-01-09 DIAGNOSIS — M79673 Pain in unspecified foot: Secondary | ICD-10-CM

## 2014-01-09 DIAGNOSIS — B351 Tinea unguium: Secondary | ICD-10-CM

## 2014-01-09 NOTE — Telephone Encounter (Signed)
CALLED PATIENT TO INFORM OF TEST AND FU, LVM FOR A RETURN  CALL

## 2014-01-10 ENCOUNTER — Ambulatory Visit (INDEPENDENT_AMBULATORY_CARE_PROVIDER_SITE_OTHER): Payer: Medicare Other | Admitting: Pharmacist

## 2014-01-10 DIAGNOSIS — Z5181 Encounter for therapeutic drug level monitoring: Secondary | ICD-10-CM

## 2014-01-10 DIAGNOSIS — I4891 Unspecified atrial fibrillation: Secondary | ICD-10-CM

## 2014-01-10 LAB — POCT INR: INR: 3.1

## 2014-01-10 NOTE — Progress Notes (Signed)
Patient ID: [name] Gill, male   DOB: 1926-01-09, 80 y.o.   MRN: [id number]  Subjective: This patient presents today complaining of painful toenails. He attempted to trim the toenails in the last several days resulting in multiple areas of bleeding and crusting.  Objective: Orientated x3 space white male Dried blood noted in around the fourth left,, fourth right, first right, and fifth right toenail area. All these areas have dry crusted blood without any obvious erythema, edema or active drainage All nail plates are brittle, discolored, incurvated  Assessment: Traumatic patient is bleeding in in the above multiple sites as described without a clinical sign of infection Symptomatic onychomycoses x10  Plan: All 10 toenails are debrided without a bleeding  Patient advised not to attempt to trim toenails at home  Reappoint at 10 week intervals

## 2014-01-17 ENCOUNTER — Ambulatory Visit (HOSPITAL_COMMUNITY)
Admission: RE | Admit: 2014-01-17 | Discharge: 2014-01-17 | Disposition: A | Discharge status: Home / Self Care | Payer: Medicare Other | Source: Ambulatory Visit | Attending: Radiation Oncology | Admitting: Radiation Oncology

## 2014-01-17 DIAGNOSIS — M47814 Spondylosis without myelopathy or radiculopathy, thoracic region: Secondary | ICD-10-CM | POA: Insufficient documentation

## 2014-01-17 DIAGNOSIS — C3490 Malignant neoplasm of unspecified part of unspecified bronchus or lung: Secondary | ICD-10-CM | POA: Insufficient documentation

## 2014-01-17 DIAGNOSIS — J438 Other emphysema: Secondary | ICD-10-CM | POA: Insufficient documentation

## 2014-01-17 DIAGNOSIS — R911 Solitary pulmonary nodule: Secondary | ICD-10-CM | POA: Insufficient documentation

## 2014-01-17 DIAGNOSIS — K8020 Calculus of gallbladder without cholecystitis without obstruction: Secondary | ICD-10-CM | POA: Insufficient documentation

## 2014-01-17 DIAGNOSIS — C3430 Malignant neoplasm of lower lobe, unspecified bronchus or lung: Secondary | ICD-10-CM

## 2014-01-17 DIAGNOSIS — Z951 Presence of aortocoronary bypass graft: Secondary | ICD-10-CM | POA: Insufficient documentation

## 2014-01-17 DIAGNOSIS — K439 Ventral hernia without obstruction or gangrene: Secondary | ICD-10-CM | POA: Insufficient documentation

## 2014-01-18 ENCOUNTER — Telehealth: Payer: Self-pay

## 2014-01-18 NOTE — Telephone Encounter (Signed)
Patient not home.Spoke with wife and informed that ct of chest revealed decrease in tumor size and good response.Follow up in December 2015with repeat ct chest on the 28th  and follow up with Dr.Wentworth on the 31st.

## 2014-01-25 ENCOUNTER — Ambulatory Visit (INDEPENDENT_AMBULATORY_CARE_PROVIDER_SITE_OTHER): Payer: Medicare Other | Admitting: *Deleted

## 2014-01-25 DIAGNOSIS — Z5181 Encounter for therapeutic drug level monitoring: Secondary | ICD-10-CM

## 2014-01-25 DIAGNOSIS — I4891 Unspecified atrial fibrillation: Secondary | ICD-10-CM

## 2014-01-25 LAB — POCT INR: INR: 4

## 2014-01-31 ENCOUNTER — Other Ambulatory Visit: Payer: Self-pay | Admitting: Internal Medicine

## 2014-02-08 ENCOUNTER — Ambulatory Visit (INDEPENDENT_AMBULATORY_CARE_PROVIDER_SITE_OTHER): Payer: Medicare Other | Admitting: *Deleted

## 2014-02-08 DIAGNOSIS — I4891 Unspecified atrial fibrillation: Secondary | ICD-10-CM

## 2014-02-08 DIAGNOSIS — Z5181 Encounter for therapeutic drug level monitoring: Secondary | ICD-10-CM

## 2014-02-08 LAB — POCT INR: INR: 1.6

## 2014-02-22 ENCOUNTER — Ambulatory Visit (INDEPENDENT_AMBULATORY_CARE_PROVIDER_SITE_OTHER): Payer: Medicare Other | Admitting: *Deleted

## 2014-02-22 DIAGNOSIS — Z5181 Encounter for therapeutic drug level monitoring: Secondary | ICD-10-CM

## 2014-02-22 DIAGNOSIS — I4891 Unspecified atrial fibrillation: Secondary | ICD-10-CM

## 2014-02-22 LAB — POCT INR: INR: 2.1

## 2014-03-06 ENCOUNTER — Other Ambulatory Visit: Payer: Self-pay | Admitting: Cardiology

## 2014-03-07 ENCOUNTER — Other Ambulatory Visit: Payer: Self-pay | Admitting: Cardiology

## 2014-03-15 ENCOUNTER — Ambulatory Visit (INDEPENDENT_AMBULATORY_CARE_PROVIDER_SITE_OTHER): Payer: Medicare Other | Admitting: Pharmacist

## 2014-03-15 DIAGNOSIS — I4891 Unspecified atrial fibrillation: Secondary | ICD-10-CM

## 2014-03-15 DIAGNOSIS — Z5181 Encounter for therapeutic drug level monitoring: Secondary | ICD-10-CM

## 2014-03-15 LAB — POCT INR: INR: 1.8

## 2014-03-20 ENCOUNTER — Ambulatory Visit (INDEPENDENT_AMBULATORY_CARE_PROVIDER_SITE_OTHER): Payer: Medicare Other | Admitting: Podiatry

## 2014-03-20 DIAGNOSIS — B351 Tinea unguium: Secondary | ICD-10-CM

## 2014-03-20 DIAGNOSIS — M79673 Pain in unspecified foot: Secondary | ICD-10-CM

## 2014-03-20 DIAGNOSIS — M79609 Pain in unspecified limb: Secondary | ICD-10-CM

## 2014-03-21 NOTE — Progress Notes (Signed)
Patient ID: [name] Gill, male   DOB: October 10, 1925, 80 y.o.   MRN: [id number]  Subjective: This patient presents today complaining of painful toenails  Objective: Elongated, hypertrophic, discolored toenails 6-10  Assessment: Symptomatic onychomycoses 6-10  Plan: Nails x10 are debrided without any bleeding  Reappoint at three-month intervals

## 2014-03-28 ENCOUNTER — Ambulatory Visit (INDEPENDENT_AMBULATORY_CARE_PROVIDER_SITE_OTHER): Payer: Medicare Other | Admitting: *Deleted

## 2014-03-28 DIAGNOSIS — I495 Sick sinus syndrome: Secondary | ICD-10-CM

## 2014-03-28 LAB — MDC_IDC_ENUM_SESS_TYPE_REMOTE
Battery Remaining Longevity: 123 mo
Lead Channel Impedance Value: 0 Ohm
Lead Channel Pacing Threshold Amplitude: 0.5 V
Lead Channel Pacing Threshold Pulse Width: 0.4 ms
Lead Channel Sensing Intrinsic Amplitude: 11.2 mV
Lead Channel Setting Pacing Amplitude: 2 V
MDC IDC MSMT BATTERY IMPEDANCE: 138 Ohm
MDC IDC MSMT BATTERY VOLTAGE: 2.79 V
MDC IDC MSMT LEADCHNL RV IMPEDANCE VALUE: 527 Ohm
MDC IDC SESS DTM: 20150908104418
MDC IDC SET LEADCHNL RV PACING PULSEWIDTH: 0.4 ms
MDC IDC SET LEADCHNL RV SENSING SENSITIVITY: 5.6 mV
MDC IDC STAT BRADY RV PERCENT PACED: 42 %

## 2014-03-28 NOTE — Progress Notes (Signed)
Remote pacemaker transmission.   

## 2014-03-29 ENCOUNTER — Other Ambulatory Visit: Payer: Self-pay | Admitting: Internal Medicine

## 2014-04-05 ENCOUNTER — Ambulatory Visit (INDEPENDENT_AMBULATORY_CARE_PROVIDER_SITE_OTHER): Payer: Medicare Other | Admitting: Cardiology

## 2014-04-05 ENCOUNTER — Ambulatory Visit (INDEPENDENT_AMBULATORY_CARE_PROVIDER_SITE_OTHER): Payer: Medicare Other | Admitting: Pharmacist

## 2014-04-05 ENCOUNTER — Encounter: Payer: Self-pay | Admitting: Cardiology

## 2014-04-05 VITALS — BP 120/68 | HR 97 | Ht 66.5 in | Wt 215.0 lb

## 2014-04-05 DIAGNOSIS — Z7901 Long term (current) use of anticoagulants: Secondary | ICD-10-CM

## 2014-04-05 DIAGNOSIS — Z5181 Encounter for therapeutic drug level monitoring: Secondary | ICD-10-CM

## 2014-04-05 DIAGNOSIS — I2510 Atherosclerotic heart disease of native coronary artery without angina pectoris: Secondary | ICD-10-CM

## 2014-04-05 DIAGNOSIS — I495 Sick sinus syndrome: Secondary | ICD-10-CM

## 2014-04-05 DIAGNOSIS — I77810 Thoracic aortic ectasia: Secondary | ICD-10-CM

## 2014-04-05 DIAGNOSIS — I4891 Unspecified atrial fibrillation: Secondary | ICD-10-CM

## 2014-04-05 DIAGNOSIS — I482 Chronic atrial fibrillation, unspecified: Secondary | ICD-10-CM

## 2014-04-05 LAB — POCT INR: INR: 2.6

## 2014-04-05 NOTE — Progress Notes (Signed)
[facility], [address] [place], [place]  [address] Phone: [phone] Fax:  [phone]  Date:  04/05/2014   ID:  [name], DOB 08-20-1925, MRN [id number]  PCP:  Mathews Argyle, MD  Cardiologist:  Fransico Him, MD     History of Present Illness:[name] Gill is a 80 y.o. male with a history of ASCAD, s/p CABG, dyslipidemia, HTN, chronic atrial fibrillation, chronic systemic anticoagulation and tachybrady syndrome s/p PPM who presents for followup. He had a scan done of his neck and chest for a parotid gland mass and the CT showed a spot on his lung and just had a PET scan done and then lung biopsy by Dr. Servando Snare and was diagnosed with Stage I lung cancer and had a resection.  He underwent XRT and is doing well.  He denies any chest pain, SOB, DOE, palpitations, dizziness or syncope. He occasionally has some LE edema.    Wt Readings from Last 3 Encounters:  04/05/14 215 lb (97.523 kg)  01/09/14 214 lb (97.07 kg)  01/06/14 214 lb 8 oz (97.297 kg)     Past Medical History  Diagnosis Date  . Atrial fibrillation     coumadin, pacemaker/ CHRONIC  . Diverticulitis 1975  . Hypertension   . COPD (chronic obstructive pulmonary disease)     FEV1 1.35/58% 01-11-10  . Pacemaker -Medtronic     Medtronic  . Ventricular tachycardia, non-sustained     Detected on the device  . Hyperlipidemia     goal LDL less than 70  . PVD (peripheral vascular disease)     w mod left external carotid artery stenosis.   . Obesity   . Ichthyosis   . Renal insufficiency     stage III   . Hyperglycemia     mild glucose 111 on 11/2010  . H/O echocardiogram     nl LVF w severe ASSH and mod cLVH, mildly dilated aorta, milf LAE, mild MR/TR and moderate AVSC  . Incisional hernia     2 upper and 1 lower abdominal incisional hernia.   . Low testosterone     start androgel  . Tachycardia-bradycardia syndrome     s/p PPM  . Chronic anticoagulation   . CHD (coronary heart disease)     s/p CABG  with patents grafts at cath 2009  . Aortic root dilatation   . CHF (congestive heart failure)   . Obesity   . Cataract      no surgery  . GERD (gastroesophageal reflux disease)   . Dysrhythmia     afib  . OSA (obstructive sleep apnea)     04-04-08 NPSG AHI 14.5, 3L/M suppl O2 for nadir 86%  . H/O hiatal hernia   . Arthritis   . Anemia     hx  . Malignant neoplasm of lower lobe, bronchus, or lung 11/22/2013  . Radiation 5/5,57/,5/11,5/13,5/152015    right upper lobe 60 Gy in 5 fractions    Current Outpatient Prescriptions  Medication Sig Dispense Refill  . Ascorbic Acid (VITAMIN C) 500 MG tablet Take 500 mg by mouth daily.        [name] atorvastatin (LIPITOR) 80 MG tablet Take 80 mg by mouth daily.       . beta carotene w/minerals (OCUVITE) tablet Take 1 tablet by mouth daily.       [name] CARTIA XT 180 MG 24 hr capsule TAKE 1 CAPSULE BY MOUTH DAILY  30 capsule  0  . Cholecalciferol (VITAMIN D PO)  Take 1 tablet by mouth daily.       [name] co-enzyme Q-10 30 MG capsule Take 2 capsules by mouth 2 (two) times daily.       . fenofibrate 54 MG tablet Take 54 mg by mouth Daily.       [name] FOLIC ACID PO Take 1 tablet by mouth daily.       . furosemide (LASIX) 80 MG tablet TAKE 1 TABLET BY MOUTH DAILY  90 tablet  0  . GRAPE SEED EXTRACT PO Take 1 capsule by mouth 2 (two) times daily.      . Lycopene 10 MG CAPS Take 10 mg by mouth daily.      . Misc Natural Products (BLACK CHERRY CONCENTRATE PO) Take 2 capsules by mouth daily.      . Multiple Vitamin (MULTIVITAMIN PO) Take 1 tablet by mouth daily.       [name] NITROSTAT 0.4 MG SL tablet Place 0.4 mg under the tongue every 5 (five) minutes as needed for chest pain. Take as directed      . Omega-3 Fatty Acids (FISH OIL) 1000 MG CAPS Take 3,000 mg by mouth daily.       [name] OVER THE COUNTER MEDICATION Take 4-5 oz by mouth every evening. "Diet tonic water"- quinine      . pantoprazole (PROTONIX) 40 MG tablet Take 40 mg by mouth daily.       . potassium chloride  (KLOR-CON 10) 10 MEQ CR tablet Take 10 mEq by mouth daily.        [name] RESVERATROL PO Take 1 capsule by mouth 2 (two) times daily.      . saw palmetto 160 MG capsule Take 800 mg by mouth daily. Takes 5 capsules daily      . SELENIUM PO Take 1 tablet by mouth daily.       [name] SPIRIVA HANDIHALER 18 MCG inhalation capsule INHALE CONTENTS OF 1 CAPSULE BY MOUTH DAILY AS DIRECTED  90 capsule  1  . SYMBICORT 160-4.5 MCG/ACT inhaler INHALE 2 PUFFS INTO THE LUNGS 2 TIMES DAILY  10.2 g  4  . TURMERIC PO Take 1 capsule by mouth daily.      [name] warfarin (COUMADIN) 2.5 MG tablet Take 2.5-5 mg by mouth daily. 2.5mg  Wednesday and Friday; 5mg  the rest of the week       No current facility-administered medications for this visit.    Allergies:   No Active Allergies  Social History:  The patient  reports that he quit smoking about 16 years ago. His smoking use included Cigarettes. He has a 110 pack-year smoking history. He has never used smokeless tobacco. He reports that he drinks alcohol. He reports that he does not use illicit drugs.   Family History:  The patient's family history includes Aortic stenosis in his mother; Cancer in his father; Tuberculosis in his father.   ROS:  Please see the history of present illness.      All other systems reviewed and negative.   PHYSICAL EXAM: VS:  BP 120/68  Pulse 97  Ht 5' 6.5" (1.689 m)  Wt 215 lb (97.523 kg)  BMI 34.19 kg/m2 Well nourished, well developed, in no acute distress HEENT: normal Neck: no JVD Cardiac:  normal S1, S2; RRR; no murmur Lungs:  clear to auscultation bilaterally, no wheezing, rhonchi or rales Abd: soft, nontender, no hepatomegaly Ext: no edema Skin: warm and dry Neuro:  CNs 2-12 intact, no focal abnormalities noted     ASSESSMENT  AND PLAN:  1. ASCAD with no angina 2. Chronic atrial fibrillation - rate controlled       - continue warfarin/Cartia 3. Dyslipidemia - continue atorvastatin - check FLP and ALT 4. Chronic systemic  anticoagulation 5. HTN well controlled       - continue Diltiazem  6. Tachybrady syndrome s/p PPM 7. Mildly dilated aortic root - normal size on last echo 8. Chronic LE edema       - continue Lasix        - check BMET  Followup with me in 6 months    Signed, Fransico Him, MD 04/05/2014 11:30 AM

## 2014-04-05 NOTE — Patient Instructions (Addendum)
Your physician recommends that you return for a FASTING lipid, alt and bmet on  Your physician recommends that you continue on your current medications as directed. Please refer to the Current Medication list given to you today.  Your physician wants you to follow-up in: 6 months with Dr. Radford Pax. You will receive a reminder letter in the mail two months in advance. If you don't receive a letter, please call our office to schedule the follow-up appointment.

## 2014-04-10 ENCOUNTER — Other Ambulatory Visit: Payer: Self-pay | Admitting: Cardiology

## 2014-04-11 ENCOUNTER — Encounter: Payer: Self-pay | Admitting: Cardiology

## 2014-04-12 ENCOUNTER — Other Ambulatory Visit: Payer: Medicare Other

## 2014-04-13 ENCOUNTER — Encounter: Payer: Self-pay | Admitting: Internal Medicine

## 2014-04-13 ENCOUNTER — Encounter: Payer: Self-pay | Admitting: Cardiology

## 2014-04-14 ENCOUNTER — Other Ambulatory Visit (INDEPENDENT_AMBULATORY_CARE_PROVIDER_SITE_OTHER): Payer: Medicare Other

## 2014-04-14 DIAGNOSIS — I2510 Atherosclerotic heart disease of native coronary artery without angina pectoris: Secondary | ICD-10-CM

## 2014-04-14 LAB — BASIC METABOLIC PANEL
BUN: 35 mg/dL — ABNORMAL HIGH (ref 6–23)
CALCIUM: 9 mg/dL (ref 8.4–10.5)
CO2: 27 mEq/L (ref 19–32)
CREATININE: 1.5 mg/dL (ref 0.4–1.5)
Chloride: 105 mEq/L (ref 96–112)
GFR: 46.78 mL/min — AB (ref >60.00)
Glucose, Bld: 91 mg/dL (ref 70–99)
Potassium: 4.2 mEq/L (ref 3.5–5.1)
Sodium: 140 mEq/L (ref 135–145)

## 2014-04-14 LAB — LIPID PANEL
CHOLESTEROL: 114 mg/dL (ref 0–200)
HDL: 28.4 mg/dL — ABNORMAL LOW (ref >39.00)
LDL CALC: 70 mg/dL (ref 0–99)
NonHDL: 85.6
Total CHOL/HDL Ratio: 4
Triglycerides: 77 mg/dL (ref 0.0–149.0)
VLDL: 15.4 mg/dL (ref 0.0–40.0)

## 2014-04-14 LAB — ALT: ALT: 13 U/L (ref 0–53)

## 2014-04-14 IMAGING — CT CT BIOPSY
1 of 3 series · 9 of 32 positions shown, 15 images · non-contrast
Comparison: none

CLINICAL DATA: 87-year-old male with a PET positive right upper
lobe cavitary pulmonary nodule concerning for primary bronchogenic
carcinoma. CT-guided biopsy is warranted to confirm tissue
diagnosis.
TECHNIQUE: Informed consent was obtained from the patient following explanation
of the procedure, risks, benefits and alternatives. The patient
understands, agrees and consents for the procedure. All questions
were addressed. A time out was performed.

[Series 2: i-spiral 5.0 b40f · axial · 0.87mm/px · z∈[+1156,+1232]mm · 9 of 28 slices shown, 15 images]
[im 3/28  soft-tissue]
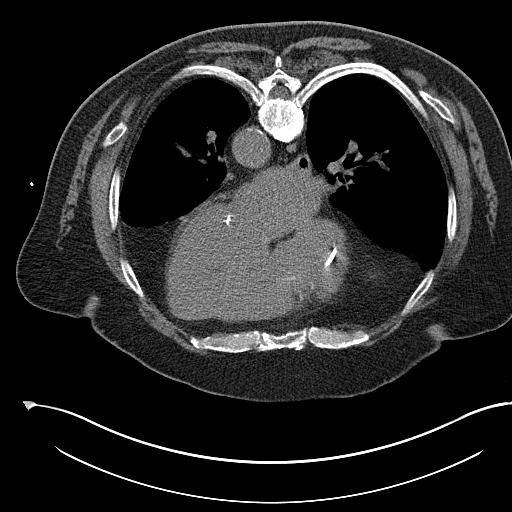
[im 3/28  bone]
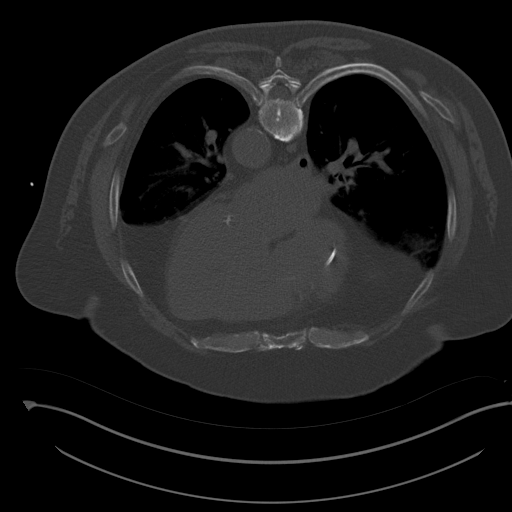
[im 6/28  soft-tissue]
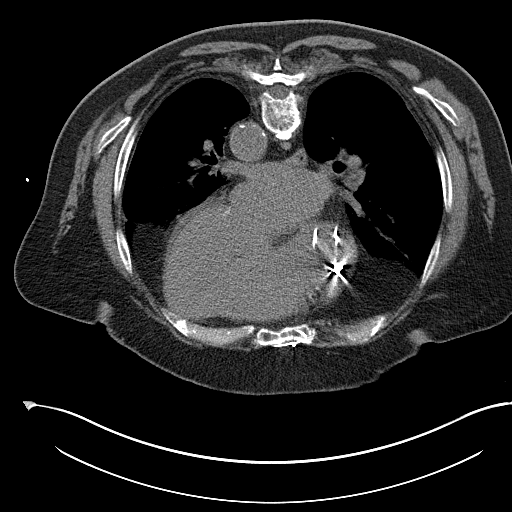
[im 9/28  soft-tissue]
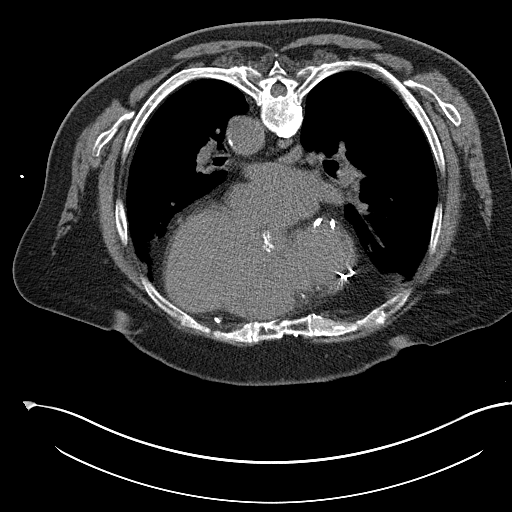
[im 11/28  soft-tissue]
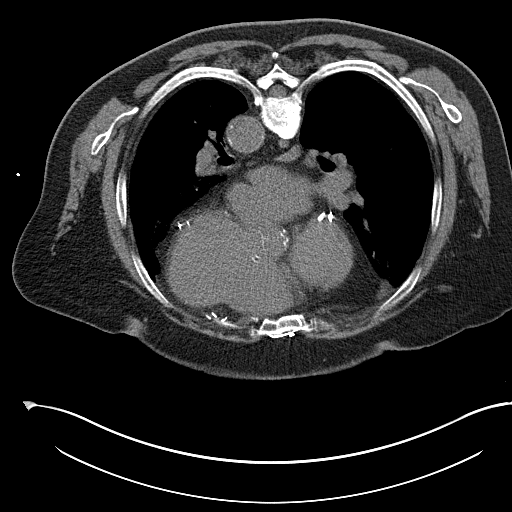
[im 14/28  soft-tissue]
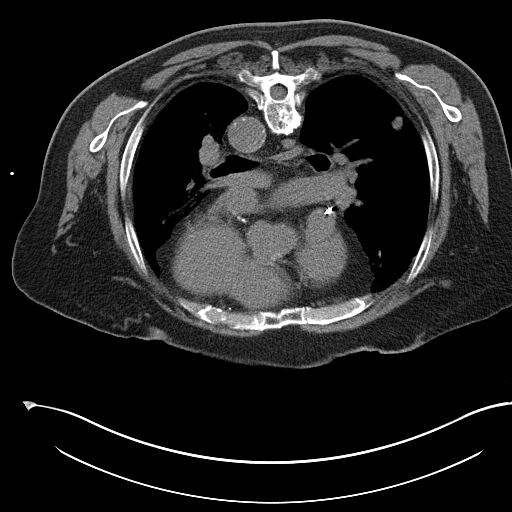
[im 17/28  soft-tissue]
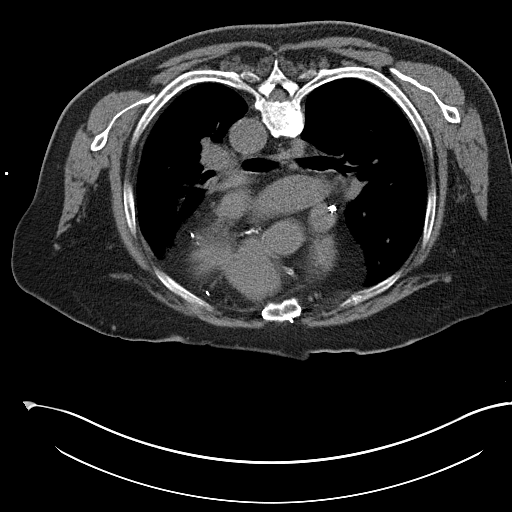
[im 17/28  lung]
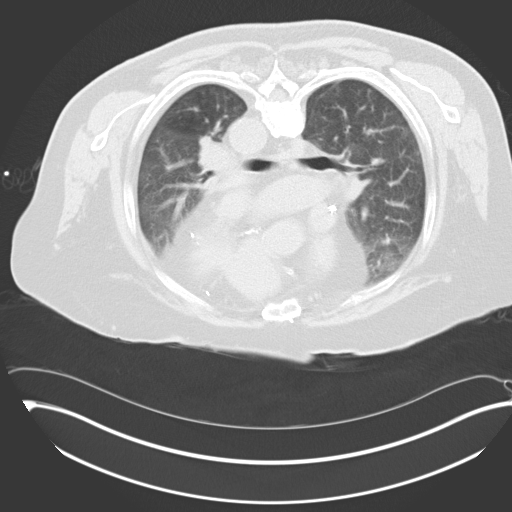
[im 19/28  soft-tissue]
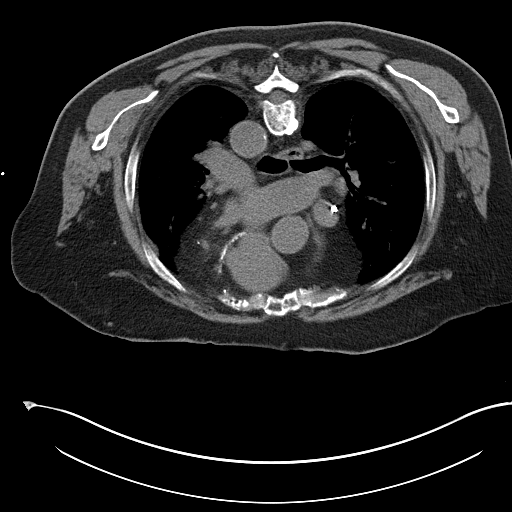
[im 19/28  lung]
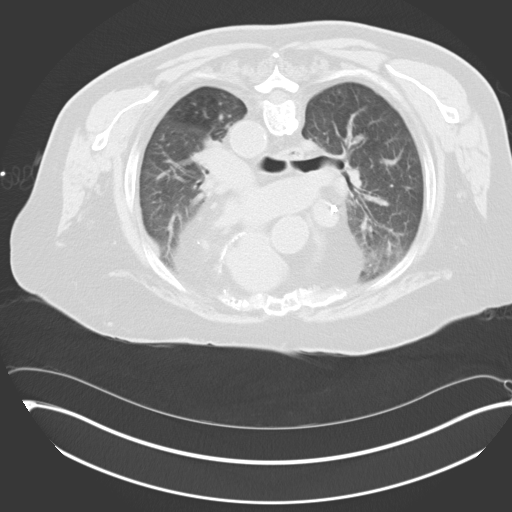
[im 22/28  soft-tissue]
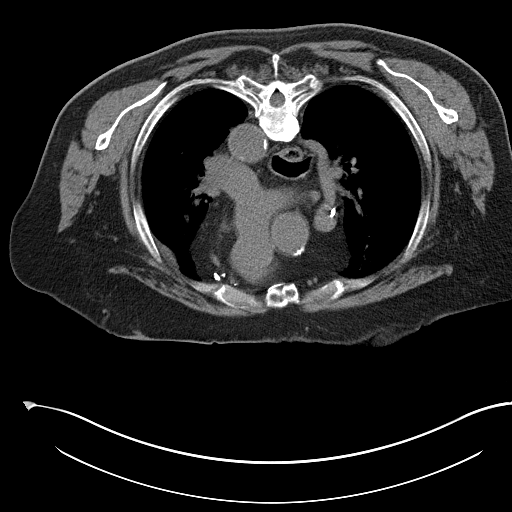
[im 22/28  lung]
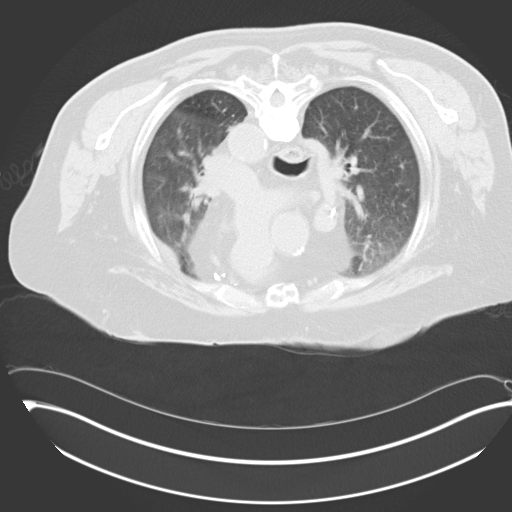
[im 25/28  soft-tissue]
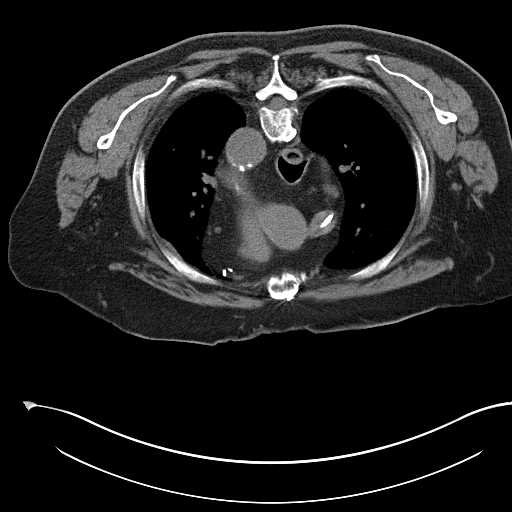
[im 25/28  lung]
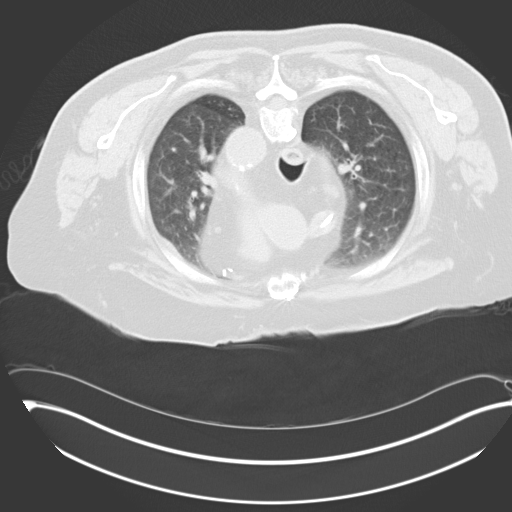
[im 25/28  bone]
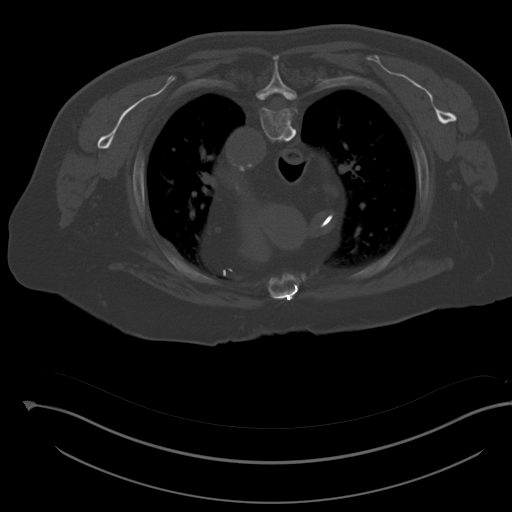

[9 of 32 positions shown; findings below may reference images not displayed]

EXAM:
CT BIOPSY

Date: 10/31/2013

PROCEDURE:
1. CT-guided lung biopsy

ANESTHESIA/SEDATION:
Moderate (conscious) sedation was used. 1 mg Versed, 75 mcg Fentanyl
were administered intravenously. The patient's vital signs were
monitored continuously by radiology nursing throughout the
procedure.

Sedation Time: 15 minutes
A planning axial CT scan was performed. These solitary pulmonary
nodule in the periphery of the right upper lobe was successfully
identified. A suitable skin entry site was selected and marked. The
region was prepped and draped in the standard sterile fashion using
Betadine skin prep. Local anesthesia was attained by infiltration
with 1% lidocaine. A small dermatotomy was made. Using intermittent
CT guidance, a 17 gauge introducer needle was advanced through the
right posterior chest wall, into the lung into the margin of the
nodule. Multiple 18 gauge core biopsies were then coaxially obtained
using the BioPince automated biopsy device. Intermittent CT imaging
documented no immediate complication.

The core biopsies were placed in formalin and delivered to pathology
for further analysis. The biopsy device in trocar needle were
removed. A final axial CT scan demonstrates no evidence of
pneumothorax or significant hemorrhage. There is minimal
peri-lesional alveolar hemorrhage. The patient tolerated the
procedure very well.
IMPRESSION: Successful CT-guided core biopsy of right upper lobe pulmonary
nodule.

## 2014-05-01 ENCOUNTER — Other Ambulatory Visit: Payer: Self-pay | Admitting: Cardiology

## 2014-05-03 ENCOUNTER — Ambulatory Visit (INDEPENDENT_AMBULATORY_CARE_PROVIDER_SITE_OTHER): Payer: Medicare Other | Admitting: Pharmacist

## 2014-05-03 DIAGNOSIS — Z5181 Encounter for therapeutic drug level monitoring: Secondary | ICD-10-CM

## 2014-05-03 DIAGNOSIS — I4891 Unspecified atrial fibrillation: Secondary | ICD-10-CM

## 2014-05-03 LAB — POCT INR: INR: 2.6

## 2014-05-08 ENCOUNTER — Other Ambulatory Visit: Payer: Self-pay | Admitting: Cardiology

## 2014-05-09 ENCOUNTER — Telehealth: Payer: Self-pay | Admitting: Cardiology

## 2014-05-09 MED ORDER — FUROSEMIDE 80 MG PO TABS: ORAL_TABLET | ORAL | Status: DC

## 2014-05-09 NOTE — Telephone Encounter (Signed)
No BMET needed since just done

## 2014-05-09 NOTE — Telephone Encounter (Signed)
I couldn't see where the patient's lasix was increased in his chart. I did call him to confirm that he is on lasix 80 mg one tablet in the am and 1/2 tablet in the pm. I advised him that Dr. Radford Pax is wanting an updated BMP due to the increase. The patient states that he was already on this dose when he had labs 04/14/14. I advised him I would make Dr. Radford Pax aware. Pharmacy has been contacted and aware a new RX is being sent on the patient.

## 2014-05-09 NOTE — Telephone Encounter (Signed)
Please call patient's pharmacy - when I received the email to approve his lasix he is now taking 80mg  1 tablet in am and 1/2 tablet in pm.  I tried to change the dose and the prescription automatically was sent with 80mg  BID before I could change the dose in the instructions.  Please call pharmacy and let them know patient takes 80mg  1 tablet in am and 1/2 tablet in PM - he needs to come in for a BMET on higher diuretic dose

## 2014-05-09 NOTE — Telephone Encounter (Signed)
Please advise on what patients current lasix therapy should be. Thanks, MI

## 2014-05-09 NOTE — Telephone Encounter (Signed)
Noted  

## 2014-05-09 NOTE — Addendum Note (Signed)
Addended by: Alvis Lemmings C on: 05/09/2014 02:17 PM   Modules accepted: Orders

## 2014-05-29 ENCOUNTER — Encounter: Payer: Self-pay | Admitting: Internal Medicine

## 2014-05-29 ENCOUNTER — Ambulatory Visit (INDEPENDENT_AMBULATORY_CARE_PROVIDER_SITE_OTHER): Payer: Medicare Other | Admitting: Internal Medicine

## 2014-05-29 VITALS — BP 120/74 | HR 89 | Ht 66.0 in | Wt 215.8 lb

## 2014-05-29 DIAGNOSIS — J432 Centrilobular emphysema: Secondary | ICD-10-CM

## 2014-05-29 DIAGNOSIS — G4733 Obstructive sleep apnea (adult) (pediatric): Secondary | ICD-10-CM

## 2014-05-29 DIAGNOSIS — I2510 Atherosclerotic heart disease of native coronary artery without angina pectoris: Secondary | ICD-10-CM

## 2014-05-29 NOTE — Patient Instructions (Signed)
We can continue BIPAP  Please call as needed

## 2014-05-29 NOTE — Progress Notes (Signed)
[name], MD 11/18/2011 9:09 PM Pended  Patient ID: [name] Gill, male DOB: 15-Jun-1926, 80 y.Gill. MRN: [id number]  HPI  [name] Gill followed for OSA and COPD complicated by CAD and AFib/ Gill. with [name] Gill. Last here May 15, 2010. Since then [name] Gill finished Pulmonary Rehab and says it was life changing. Pacing himself [name] Gill can now walk around the block with a few rest stops. We talked about ways to maintain the momentum. Otherwise quiet winter- had one respiratory infection- not bad. Heart ok- no new problems or changes by Dr Radford Pax. FEV1 was 1.35/ 58% in 2011. Dr Felipa Eth did CXR during the winter.  [name] Gill continues BIPAP 11/9 with O2 2 L/M, used at least 5-6 hours every night.  Denies routine cough, phlegm, chest pain, palpitation, bloody or purulent sputum.   05/16/11- [name] Gill followed for OSA and COPD complicated by CAD and AFib/ Gill, [name] Gill.  [name] Gill is here. Had flu shot and shingles vaccine. [name] Gill feels [name] Gill is doing very well. Pulmonary rehabilitation was a big help and [name] Gill has lost 28 pounds since April. [name] Gill is now in the maintenance program. Comfortable off of oxygen in the daytime. Using Spiriva and Symbicort. Never aware of wheeze. Denies cough or chest pain. [name] Gill has an old nebulizer machine and rescue inhaler that [name] Gill never needs. [name] Gill doesn't notice that [name] Gill wheezes.  [name] Gill continues BiPAP 11/9 with oxygen at 2 L, every night for at least 5 or 6 hours. His [name] Gill comments that [name] Gill now stays asleep on long drives and she thinks [name] Gill is doing very well.   11/14/11- [name] Gill, [name] Gill.  [name] Gill continues to use BiPAP with inspiratory pressure 11/expiratory pressure 9 and says [name] Gill is doing well with this.  [name] Gill considers his COPD status "wonderful". Pulmonary rehabilitation as a big help and has made a significant difference in his strength over the past year. Has lost some weight.  Little cough or wheeze. Sleeps with oxygen 2 L. Using Symbicort twice daily and Spiriva. Occasional epistaxis and nasal stuffiness attributed to old nasal trauma.  11/18/11- [name] Gill, [name] Gill.  PCP Dr Felipa Eth Just here 4 days ago. [name] Gill brings him in today because [name] Gill didn't tell me at last visit about recent problems she wanted me to know about. Oxygen saturation had been running 96-98% but has been lower in the past few days. Malaise. Had a gastroenteritis Gill February 27 and similar March 21. In the last few days has felt tired, chills, feet swelling, stomach bloating. More short of breath off and on. Denies pain, fever, purulent discharge, leg pain or cramps. Was taking AndroGel but stopped last week. Has been eating a lot of licorice, up to 20 sticks/ day over the past month, initially to treat constipation. Then [name] Gill just kept on because [name] Gill liked it. Has history of mild renal insufficiency. Has been eating a banana a day and potassium supplement. [name] Gill reports that the pulmonary rehabilitation nurse thought [name] Gill might be fluid overloaded. In the last 2 days [name] Gill had been eating boullion, accepting salt input in order to lose weight by avoiding sandwiches.  05/21/12- 6 Gill followed for OSA and COPD complicated by CAD and AFib/ Gill, [name] Gill.  PCP Dr Felipa Eth   had flu vaccine Feels no longer needs O2-been about 4-5 months since used last; is having severe dry mouth(caused  tooth problems); would like to have another mask (not full face mask-[employer]). Had lost weight and since then has not used oxygen at all. Treated for atrial fibrillation with Gill. Doing very well and pulmonary rehabilitation program. Does not require oxygen during that exercise. Continues BiPAP 11/9/[employer] with no humidifier. I discussed the humidifier given his concern about dry mouth. COPD assessment test (CAT)  3/40  11/24/12 87 Gill followed for OSA and COPD complicated by CAD and AFib/ Gill, [name] Gill     PCP Dr Felipa Eth FOLLOWS FOR: patient reports [name] Gill is feeling much better with his breathing esp after losing 60lbs-- c/Gill excessive dry mouth--wearing BiPAP11/9  every night for approx 4 hrs per night- tolerating pressure ok [name] Gill had lost weight, about 25 pounds by our scale while working in pulmonary rehabilitation. [name] Gill admits [name] Gill is gained some back. [name] Gill is now going to [employer]. Denies cough wheeze, edema or chest pain. Using a nasal decongestant spray about twice daily and we discussed this, recommending [name] Gill cut way down. CXR 11/19/11 IMPRESSION:  Mild basilar predominant pulmonary edema and small bilateral  effusions.  Original Report Authenticated By: Duayne Cal, M.D.  05/27/13- 73 Gill former smoker hefollowed for OSA and COPD complicated by CAD and AFib/ Gill, [name] Gill     PCP Dr Felipa Eth FOLLOWS FOR:  Pt states [name] Gill did have "cold" and was recently treated with abx. Wears BiPAP  11/9every night for about 4-5 hours; gets extreme dry mouth-has tried biotene since last visit and helps slightly. DME is [facility]. Dryness accentuated by furosemide and Spiriva. Still doing "Silver sneakers" As [name] Gill was getting over a recent viral pattern cold Gill [name] Gill found a lymph node left neck -not tender CXR 5/ 7 / 14 IMPRESSION:  Mild chronic lung disease with bibasilar scarring. No acute  cardiopulmonary abnormality.  Original Report Authenticated By: Carl Best, M.D.  10/06/13- 51 Gill former smoker followed for OSA and COPD complicated by CAD and AFib/ Gill, [name] Gill      PCP Dr Caleen Essex and [name] Gill here. L parotid lesion being followed by Dr Lucia Gaskins ENT. Work-up led to CXR and CT chest- RUL nodule w/ central cavitation. PET positive w/Gill evident mets. We reviewed images together. [name] Gill denies unusual dyspnea, chest pain,  cough or wheeze and family concurs. No blood and no nodes or masses except L parotid.  [name] Gill has f/u appt tomorrow w/ Dr Radford Pax Cardiology. BIPAP 11/9 with O2 2L for sleep/ [facility].  CT neck 09/07/13 IMPRESSION:  1. Within the left parotid gland primarily in the superficial lobe  there are 2 adjacent circumscribed soft tissue masses. The larger  more inferior measures up to 22 mm diameter. 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  2. No regional lymphadenopathy. No other neck mass.  3. There is a partially visible 13 mm right lung nodule, not evident  on the 11/24/2012 chest radiographs. Recommend followup chest CT (IV  contrast preferred but could be omitted if the patient has any  history of renal insufficiency) to evaluate persistence and/or  characterize further.  4. 15 mm right thyroid nodule, significance doubtful in this age  group.  Electronically Signed  By: Lars Pinks M.D.  On: 09/07/2013 12:20 CT CHEST WITH CONTRAST 09/20/13 IMPRESSION:  1. Centrally cavitary right upper lobe pulmonary nodule. Suspicious  for primary bronchogenic carcinoma. This is superimposed upon mild  to moderate centrilobular emphysema. Consider further  evaluation  with PET.  2. Probable scarring at the left apex. This is somewhat nodular more  medially. This could be re-evaluated at followup PET.  3. Cardiomegaly with small bilateral pleural effusions, suggesting a  component of congestive heart failure/fluid overload.  4. Thoracic adenopathy. This is nonspecific in the setting of  congestive heart failure. Could be secondary. However, metastatic  disease cannot be excluded.  5. Pulmonary artery enlargement suggests pulmonary arterial  hypertension.  6. Cholelithiasis.  7. Incompletely imaged transverse colon containing ventral abdominal  wall hernia.  8. Indeterminate right-sided thyroid nodule. This  could be further  evaluated with thyroid ultrasound. However, given patient age and  other comorbidities, of questionable significance.  Electronically Signed  By: Abigail Miyamoto M.D.  On: 09/23/2013 10:17 PET 09/27/13 IMPRESSION:  1. Hypermetabolism corresponding to the cavitary posterior right  upper lobe lung nodule. Most consistent with primary bronchogenic  carcinoma. The thoracic nodes are not hypermetabolic and are  decreased in size. There were likely related to a congestive heart  failure exacerbation. Therefore, presuming non-small-cell histology,  most consistent with T1aN0M0 or stage IA.  2. Hypermetabolic left parotid nodules. 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 Please see neck CT report of 09/07/2013.  3. Hypermetabolism at the left side of the vallecula. Favored to be  physiologic and related to secretions in this area. This area is  underdistended. This could be re-evaluated on follow-up PET or if  there is a clinical concern of mucosal lesion, direct visualization  performed.  4. Incidental findings, including common iliac artery dilatation,  gallstones, and ventral abdominal wall hernias.  Electronically Signed  By: Abigail Miyamoto M.D.  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 Has started XRT.  Breathing is comfortable with little cough or wheeze now. She no longer has oxygen for sleep BiPAP 11/9-[employer] all night every night  PFT 01/11/10- moderate obstructive airways disease, insignificant response to bronchodilator. FVC 2.28/ 62%, FEV1 1.35/ 58%, FEV1/FVC 0.59, TLC 90%, DLCO 57%  05/29/14- 88 Gill former smoker followed for OSA and COPD complicated by CAD and AFib/  Gill, [name] Gill , CHF,cavitary RUL squamous cell ca, parotid neoplasm    PCP Dr Felipa Eth BIPAP 11/9 Wears BiPAP every night through [place].  Pt states his breathing has been well since last visit; has had multiple health issues and surgeries since last visit.  Finished XRT  For R lung cancer, had basal cell carcinoma resected from scalp and benign left parotid tumor resected. CT chest 01/17/14 IMPRESSION: 1. Reduced size of the right upper lobe pulmonary nodule, indicating interval effective therapy. 2. Subtle 9 mm new ground-glass opacity in the right lower lobe, probably postinflammatory given that it was not present recently, but meriting observation on follow-up exams. 3. Centrilobular emphysema. 4. Upper abdominal ventral hernias, some containing transverse colon in a Richter configuration. 5. Cholelithiasis. 6. There are 2 areas of soft tissue fullness in the tail the pancreas. Both had low-grade metabolic activity on recent PET-CT, but were also present and not significantly changed from 2010. Accordingly, I believe these are probably benign Electronically Signed  By: Sherryl Barters M.D.  On: 01/17/2014 16:19  ROS-see HPI Constitutional:   No-  weight loss, night sweats, fevers, chills, fatigue, lassitude. HEENT:   No-  headaches, difficulty swallowing, tooth/dental problems, sore throat,       No-  sneezing, itching, ear ache, nasal congestion, post nasal drip,  CV:  No-   chest pain, orthopnea, PND, swelling in lower extremities, anasarca,                                  dizziness, palpitations Resp: No-   shortness of breath with exertion or at rest.              No-   productive cough,  No non-productive cough,  No- coughing up of blood.              No-   change in color of mucus.  No- wheezing.   Skin: No-   rash or lesions. GI:  No-   heartburn, indigestion, abdominal pain, nausea, vomiting,  GU:  MS:  No-   joint pain or swelling.    Neuro-     nothing unusual Psych:  No- change in mood or affect. No depression or anxiety.  No memory loss.  Objective:   Physical Exam General- Alert, Oriented, Affect-appropriate, Distress- none acute. Obese. Skin- rash-none, lesions- none, excoriation- none Lymphadenopathy-  +mass at angle L jaw Head- atraumatic            Eyes- Gross vision intact, PERRLA, conjunctivae clear secretions            Ears- +Hearing aid            Nose- Clear, no-Septal dev, mucus, polyps, erosion, perforation             Throat- Mallampati III-IV , mucosa clear/ not very dry , drainage- none, tonsils- atrophic. Neck- flexible , trachea midline, no stridor , thyroid nl, carotid no bruit Chest - symmetrical excursion , unlabored           Heart/CV- IRR , no murmur , no gallop  , no rub, nl s1 s2                           - JVD +1cm , edema- none, stasis changes- none, varices- none           Lung- clear,  cough- none , dullness-none, rub- none           Chest wall- +L Gill Abd-  Br/ Gen/ Rectal- Not done, not indicated Extrem- cyanosis- none, clubbing, none, atrophy- none, strength- nl Neuro- grossly intact to observation

## 2014-05-30 ENCOUNTER — Telehealth: Payer: Self-pay | Admitting: *Deleted

## 2014-05-30 NOTE — Telephone Encounter (Signed)
CALLED PATIENT TO ASK QUESTION, LVM FOR A RETURN CALL 

## 2014-05-31 ENCOUNTER — Ambulatory Visit (INDEPENDENT_AMBULATORY_CARE_PROVIDER_SITE_OTHER): Payer: Medicare Other | Admitting: *Deleted

## 2014-05-31 ENCOUNTER — Telehealth: Payer: Self-pay | Admitting: *Deleted

## 2014-05-31 DIAGNOSIS — Z5181 Encounter for therapeutic drug level monitoring: Secondary | ICD-10-CM

## 2014-05-31 DIAGNOSIS — I4891 Unspecified atrial fibrillation: Secondary | ICD-10-CM

## 2014-05-31 LAB — POCT INR: INR: 2.9

## 2014-05-31 NOTE — Telephone Encounter (Signed)
CALLED PATIENT TO INFORM OF TEST BEING MOVED FROM 1 PM ON 07-17-14 TO 10 AM ON 07-17-14, SPOKE WITH PATIENT AND HE IS AWARE OF THIS CHANGE.

## 2014-06-05 ENCOUNTER — Telehealth: Payer: Self-pay | Admitting: *Deleted

## 2014-06-05 NOTE — Telephone Encounter (Signed)
Received call from Lelan Pons at Dr Lajean Manes office that pt had been placed on Levaquin 500mg  daily for  7 days  due to COPD (e) and pt has nausea and vomiting . Spoke with pt's wife regarding this and appt made to be rechecked in coumadin clinic on Thursday November 19th as coumadin and Levaquin have an interaction.Pt's wife states understanding.

## 2014-06-08 ENCOUNTER — Ambulatory Visit (INDEPENDENT_AMBULATORY_CARE_PROVIDER_SITE_OTHER): Payer: Medicare Other | Admitting: *Deleted

## 2014-06-08 DIAGNOSIS — Z5181 Encounter for therapeutic drug level monitoring: Secondary | ICD-10-CM

## 2014-06-08 DIAGNOSIS — I4891 Unspecified atrial fibrillation: Secondary | ICD-10-CM

## 2014-06-08 LAB — POCT INR: INR: 3.7

## 2014-06-18 NOTE — Assessment & Plan Note (Signed)
Mostly emphysema without complicating bronchospasm or recent respiratory infection. He has tolerated radiation therapy and surgeries with anesthesia

## 2014-06-18 NOTE — Assessment & Plan Note (Signed)
Good compliance and control with BiPAP being used appropriately despite a lot of complicating medical problems this past year. Weight loss would help as discussed.

## 2014-06-19 ENCOUNTER — Ambulatory Visit (INDEPENDENT_AMBULATORY_CARE_PROVIDER_SITE_OTHER): Payer: Medicare Other | Admitting: Podiatry

## 2014-06-19 ENCOUNTER — Encounter: Payer: Self-pay | Admitting: Podiatry

## 2014-06-19 DIAGNOSIS — B351 Tinea unguium: Secondary | ICD-10-CM

## 2014-06-19 DIAGNOSIS — M79676 Pain in unspecified toe(s): Secondary | ICD-10-CM

## 2014-06-19 NOTE — Progress Notes (Signed)
Patient ID: [name] Gill, male   DOB: 08/18/1925, 80 y.o.   MRN: [id number]  Subjective: This patient presents again complaining of painful toenails and walking wearing shoes  Objective: The toenails are brittle, hypertrophic, incurvated, discolored 6-10  Assessment: Symptomatic onychomycoses 6-10  Plan: Debrided toenails 10 Pinpoint bleeding noted fourth left toe treated with topical antibiotic ointment and Band-Aid  Patient advised to continue to apply topical antibiotic ointment and Band-Aid daily fourth left toe until healed  Reappoint 3 months

## 2014-06-19 NOTE — Patient Instructions (Signed)
Apply topical antibiotic ointment to the fourth left toenail daily and cover with a Band-Aid until a scab forms

## 2014-06-29 ENCOUNTER — Encounter (HOSPITAL_COMMUNITY): Payer: Self-pay | Admitting: Internal Medicine

## 2014-07-06 ENCOUNTER — Ambulatory Visit (INDEPENDENT_AMBULATORY_CARE_PROVIDER_SITE_OTHER): Payer: Medicare Other | Admitting: *Deleted

## 2014-07-06 DIAGNOSIS — I4891 Unspecified atrial fibrillation: Secondary | ICD-10-CM

## 2014-07-06 DIAGNOSIS — Z5181 Encounter for therapeutic drug level monitoring: Secondary | ICD-10-CM

## 2014-07-06 LAB — POCT INR: INR: 2.6

## 2014-07-17 ENCOUNTER — Encounter (HOSPITAL_COMMUNITY): Payer: Self-pay

## 2014-07-17 ENCOUNTER — Ambulatory Visit
Admission: RE | Admit: 2014-07-17 | Discharge: 2014-07-17 | Disposition: A | Discharge status: Home / Self Care | Payer: Medicare Other | Source: Ambulatory Visit | Attending: Radiation Oncology | Admitting: Radiation Oncology

## 2014-07-17 ENCOUNTER — Ambulatory Visit (HOSPITAL_COMMUNITY): Payer: Medicare Other

## 2014-07-17 ENCOUNTER — Ambulatory Visit (HOSPITAL_COMMUNITY)
Admission: RE | Admit: 2014-07-17 | Discharge: 2014-07-17 | Disposition: A | Discharge status: Home / Self Care | Payer: Medicare Other | Source: Ambulatory Visit | Attending: Radiation Oncology | Admitting: Radiation Oncology

## 2014-07-17 DIAGNOSIS — C3430 Malignant neoplasm of lower lobe, unspecified bronchus or lung: Secondary | ICD-10-CM | POA: Diagnosis present

## 2014-07-17 DIAGNOSIS — Z87891 Personal history of nicotine dependence: Secondary | ICD-10-CM | POA: Insufficient documentation

## 2014-07-17 DIAGNOSIS — K118 Other diseases of salivary glands: Secondary | ICD-10-CM

## 2014-07-17 DIAGNOSIS — C3431 Malignant neoplasm of lower lobe, right bronchus or lung: Secondary | ICD-10-CM

## 2014-07-17 NOTE — Progress Notes (Signed)
Patient  And son from [place] here for routine follow up completion of radiation to right upper lung lobe (60 Gy in 5 fractions)  Ct of chest 07/17/14 IMPRESSION: 1. Slight decrease in size of pulmonary lesion within the right midlung in an area of new radiation change. 2. Stable to mild increase in size of index right paratracheal lymph node which now measures 1.4 cm. 3. New left upper lobe nodule measuring 6 mm warrants attention on follow-up imaging. 4. No significant change in size of nodules within the tail of pancreas which are indeterminate. Greater than six-month of stability is a assuring feature. However, because these are both indeterminate further evaluation with nonemergent contrast enhanced MR of the pancreas is recommended.  Denies pain.shortness of breath on exertion unchanged.sinus drainage is clear to yellow.

## 2014-07-17 NOTE — Progress Notes (Signed)
Department of Radiation Oncology  Phone:  [phone] Fax:        [phone]   Name: [name] Gill MRN: [id number]  DOB: Apr 19, 1926  Date: 07/17/2014  Follow Up Visit Note  Diagnosis: Stage I NSCLC of th right upper lobe  Summary and Interval since last radiation: 60 Gy in 5 fractions completed 12/15/13  Interval History: [name] Gill presents today for routine followup.  He is doing well. He has no new or worsening pulmonary symptoms. [place]. He denies any new bone pain or headache.  He is accompanied by his son. He had a CT scan earlier today and is here for discussion of those results.   Allergies: No Known Allergies  Medications:  Current Outpatient Prescriptions  Medication Sig Dispense Refill  . Acai 500 MG CAPS Take 500 mg by mouth 2 (two) times daily.    . Ascorbic Acid (VITAMIN C) 500 MG tablet Take 500 mg by mouth daily.      [name] atorvastatin (LIPITOR) 80 MG tablet Take 80 mg by mouth daily.     . beta carotene w/minerals (OCUVITE) tablet Take 2 tablets by mouth daily.     [name] CARTIA XT 180 MG 24 hr capsule TAKE 1 CAPSULE BY MOUTH DAILY 30 capsule 5  . Cholecalciferol (VITAMIN D PO) Take 1 tablet by mouth daily.     [name] co-enzyme Q-10 30 MG capsule Take 2 capsules by mouth 2 (two) times daily.     . fenofibrate 54 MG tablet Take 54 mg by mouth Daily.     [name] FOLIC ACID PO Take 1 tablet by mouth daily.     . furosemide (LASIX) 80 MG tablet Take one tablet by mouth every morning and 1/2 of a tablet by mouth every afternoon. 135 tablet 3  . GRAPE SEED EXTRACT PO Take 1 capsule by mouth 2 (two) times daily.    . Lycopene 10 MG CAPS Take 10 mg by mouth daily.    . Misc Natural Products (BLACK CHERRY CONCENTRATE PO) Take 2 capsules by mouth daily.    . Multiple Vitamin (MULTIVITAMIN PO) Take 1 tablet by mouth daily.     [name] NITROSTAT 0.4 MG SL tablet Place 0.4 mg under the tongue every 5 (five) minutes as needed for chest pain.  Take as directed    . Omega-3 Fatty Acids (FISH OIL) 1000 MG CAPS Take 3,000 mg by mouth daily.     [name] OVER THE COUNTER MEDICATION Take 4-5 oz by mouth every evening. "Diet tonic water"- quinine    . pantoprazole (PROTONIX) 40 MG tablet Take 40 mg by mouth daily.     . potassium chloride (KLOR-CON 10) 10 MEQ CR tablet Take 10 mEq by mouth daily.      . saw palmetto 160 MG capsule Take 800 mg by mouth daily. Takes 5 capsules daily    . SELENIUM PO Take 1 tablet by mouth daily.     [name] SPIRIVA HANDIHALER 18 MCG inhalation capsule INHALE CONTENTS OF 1 CAPSULE BY MOUTH DAILY AS DIRECTED 90 capsule 1  . SYMBICORT 160-4.5 MCG/ACT inhaler INHALE 2 PUFFS INTO THE LUNGS 2 TIMES DAILY 10.2 g 4  . TURMERIC PO Take 2 capsules by mouth daily.     [name] warfarin (COUMADIN) 2.5 MG tablet TAKE AS DIRECTED BY THE COUMADIN CLINIC *THIS IS A 30 DAYS SUPPLY* 60 tablet 2  . RESVERATROL PO Take 1 capsule by mouth 2 (  two) times daily.     No current facility-administered medications for this encounter.    Physical Exam:  Alert, appropriate. No respiratory distress.   CT scan results: I personally reviewed the CT scan results with him and his son. This paratracheal lymph node was actually 1.6 cm in March and PET negative. This continues to be smaller than it was in March. The left upper lobe peripheral nodule is hazy and not well defined. Too small for PET.  IMPRESSION: [name] Gill is a 80 y.o. male a/p SBRT with no acute effects of treatment, possible new left upper lobe nodule.   PLAN:  Follow up in 4 months with CT scan to assess left upper lobe nodue.     Thea Silversmith, MD

## 2014-07-18 ENCOUNTER — Encounter: Payer: Self-pay | Admitting: *Deleted

## 2014-07-18 ENCOUNTER — Telehealth: Payer: Self-pay | Admitting: *Deleted

## 2014-07-18 ENCOUNTER — Other Ambulatory Visit: Payer: Self-pay | Admitting: Internal Medicine

## 2014-07-18 NOTE — Telephone Encounter (Signed)
CALLED PATIENT TO INFORM OF TEST AND FU, SPOKE WITH PATIENT AND HE IS AWARE OF THESE APPTS.

## 2014-07-19 ENCOUNTER — Other Ambulatory Visit: Payer: Self-pay | Admitting: Radiation Oncology

## 2014-07-19 DIAGNOSIS — C3431 Malignant neoplasm of lower lobe, right bronchus or lung: Secondary | ICD-10-CM

## 2014-07-20 ENCOUNTER — Telehealth: Payer: Self-pay | Admitting: *Deleted

## 2014-07-20 ENCOUNTER — Ambulatory Visit: Payer: Medicare Other | Admitting: Radiation Oncology

## 2014-07-20 NOTE — Telephone Encounter (Signed)
CALLED PATIENT TO INFORM OF CT ABDOMEN BEING ADDED TO CT OF CHEST ON 11-16-14 @ [facility], SPOKE WITH PATIENT AND HE IS AWARE OF THIS TEST.

## 2014-07-25 ENCOUNTER — Encounter: Payer: Self-pay | Admitting: Internal Medicine

## 2014-07-25 ENCOUNTER — Ambulatory Visit (INDEPENDENT_AMBULATORY_CARE_PROVIDER_SITE_OTHER): Payer: Medicare Other | Admitting: Internal Medicine

## 2014-07-25 VITALS — BP 124/62 | HR 85 | Ht 67.0 in | Wt 215.6 lb

## 2014-07-25 DIAGNOSIS — I4729 Other ventricular tachycardia: Secondary | ICD-10-CM

## 2014-07-25 DIAGNOSIS — C3490 Malignant neoplasm of unspecified part of unspecified bronchus or lung: Secondary | ICD-10-CM | POA: Insufficient documentation

## 2014-07-25 DIAGNOSIS — I472 Ventricular tachycardia: Secondary | ICD-10-CM

## 2014-07-25 DIAGNOSIS — I4891 Unspecified atrial fibrillation: Secondary | ICD-10-CM

## 2014-07-25 DIAGNOSIS — Z950 Presence of cardiac pacemaker: Secondary | ICD-10-CM

## 2014-07-25 LAB — MDC_IDC_ENUM_SESS_TYPE_INCLINIC
Battery Remaining Longevity: 119 mo
Battery Voltage: 2.79 V
Brady Statistic RV Percent Paced: 40 %
Date Time Interrogation Session: 20160105100117
Lead Channel Impedance Value: 0 Ohm
Lead Channel Impedance Value: 542 Ohm
Lead Channel Pacing Threshold Amplitude: 0.5 V
Lead Channel Pacing Threshold Pulse Width: 0.4 ms
Lead Channel Sensing Intrinsic Amplitude: 15.67 mV
Lead Channel Setting Pacing Pulse Width: 0.4 ms
Lead Channel Setting Sensing Sensitivity: 5.6 mV
MDC IDC MSMT BATTERY IMPEDANCE: 162 Ohm
MDC IDC SET LEADCHNL RV PACING AMPLITUDE: 2 V

## 2014-07-25 NOTE — Progress Notes (Signed)
HPI [name] returns today for PPM followup. He is a pleasant 81 yo man with a h/o chronic atrial fibrillation and symptomatic bradycardia, s/p PPM insertion 12 months ago. He has had no syncope in the interim. He has class 2 dyspnea which is multifactorial. He denies chest pain. He has moderate copd and sleep apnea. He was diagnosed with lung CA several months ago and underwent XRT and all is well. He is trying to lose weight.  No Known Allergies   Current Outpatient Prescriptions  Medication Sig Dispense Refill  . Acai 500 MG CAPS Take 500 mg by mouth 2 (two) times daily.    . Ascorbic Acid (VITAMIN C) 500 MG tablet Take 500 mg by mouth daily.      [name] atorvastatin (LIPITOR) 80 MG tablet Take 80 mg by mouth daily.     . beta carotene w/minerals (OCUVITE) tablet Take 2 tablets by mouth daily.     [name] CARTIA XT 180 MG 24 hr capsule TAKE 1 CAPSULE BY MOUTH DAILY 30 capsule 5  . Cholecalciferol (VITAMIN D PO) Take 1 tablet by mouth daily.     [name] co-enzyme Q-10 30 MG capsule Take 2 capsules by mouth 2 (two) times daily.     . fenofibrate 54 MG tablet Take 54 mg by mouth Daily.     [name] FOLIC ACID PO Take 1 tablet by mouth daily.     . furosemide (LASIX) 80 MG tablet Take one tablet by mouth every morning and 1/2 of a tablet by mouth every afternoon. 135 tablet 3  . GRAPE SEED EXTRACT PO Take 1 capsule by mouth 2 (two) times daily.    . Lycopene 10 MG CAPS Take 10 mg by mouth daily.    . Misc Natural Products (BLACK CHERRY CONCENTRATE PO) Take 2 capsules by mouth daily.    . Multiple Vitamin (MULTIVITAMIN PO) Take 1 tablet by mouth daily.     [name] NITROSTAT 0.4 MG SL tablet Place 0.4 mg under the tongue every 5 (five) minutes as needed for chest pain. Take as directed    . Omega-3 Fatty Acids (FISH OIL) 1000 MG CAPS Take 3,000 mg by mouth daily.     [name] OVER THE COUNTER MEDICATION Take 4-5 oz by mouth every evening. "Diet tonic water"- quinine    . pantoprazole (PROTONIX) 40 MG tablet Take 40 mg  by mouth daily.     . potassium chloride (KLOR-CON 10) 10 MEQ CR tablet Take 10 mEq by mouth daily.      [name] RESVERATROL PO Take 1 capsule by mouth 2 (two) times daily.    . saw palmetto 160 MG capsule Take 800 mg by mouth daily. Takes 5 capsules daily    . SELENIUM PO Take 1 tablet by mouth daily.     [name] SPIRIVA HANDIHALER 18 MCG inhalation capsule INHALE CONTENTS OF 1 CAPSULE BY MOUTH DAILY AS DIRECTED 90 capsule 1  . SYMBICORT 160-4.5 MCG/ACT inhaler INHALE 2 PUFFS INTO THE LUNGS 2 TIMES DAILY 10.2 g 3  . TURMERIC PO Take 2 capsules by mouth daily.     [name] warfarin (COUMADIN) 2.5 MG tablet TAKE AS DIRECTED BY THE COUMADIN CLINIC *THIS IS A 30 DAYS SUPPLY* 60 tablet 2   No current facility-administered medications for this visit.     Past Medical History  Diagnosis Date  . Atrial fibrillation     coumadin, pacemaker/ CHRONIC  . Diverticulitis 1975  . Hypertension   . COPD (  chronic obstructive pulmonary disease)     FEV1 1.35/58% 01-11-10  . Pacemaker -Medtronic     Medtronic  . Ventricular tachycardia, non-sustained     Detected on the device  . Hyperlipidemia     goal LDL less than 70  . PVD (peripheral vascular disease)     w mod left external carotid artery stenosis.   . Obesity   . Ichthyosis   . Renal insufficiency     stage III   . Hyperglycemia     mild glucose 111 on 11/2010  . H/O echocardiogram     nl LVF w severe ASSH and mod cLVH, mildly dilated aorta, milf LAE, mild MR/TR and moderate AVSC  . Incisional hernia     2 upper and 1 lower abdominal incisional hernia.   . Low testosterone     start androgel  . Tachycardia-bradycardia syndrome     s/p PPM  . Chronic anticoagulation   . CHD (coronary heart disease)     s/p CABG with patents grafts at cath 2009  . Aortic root dilatation   . CHF (congestive heart failure)   . Obesity   . Cataract      no surgery  . GERD (gastroesophageal reflux disease)   . Dysrhythmia     afib  . OSA (obstructive sleep apnea)      04-04-08 NPSG AHI 14.5, 3L/M suppl O2 for nadir 86%  . H/O hiatal hernia   . Arthritis   . Anemia     hx  . Malignant neoplasm of lower lobe, bronchus, or lung 11/22/2013  . Radiation 5/5,57/,5/11,5/13,5/152015    right upper lobe 60 Gy in 5 fractions    ROS:   All systems reviewed and negative except as noted in the HPI.   Past Surgical History  Procedure Laterality Date  . Broken nose    . Pacemaker insertion  02/2008    medtronic  . Coronary artery bypass graft  7425,9563    coronary disease s/p CABG 2004 and Cath 2009  . Cardiac catheterization  2009  . Pacemaker insertion  11/14  . Colon surgery  85    diverticulitis  . Back surgery      herniated disc  . Hernia repair    . Parotidectomy Left 12/15/2013    WITH NECK DISECTION  . Parotidectomy Left 12/15/2013    Procedure: SUPEFICIAL PAROTIDECTOMY WITH FACIAL NERVE DISSECTION;  Surgeon: [name], MD;  Location: [facility];  Service: ENT;  Laterality: Left;  . Permanent pacemaker generator change N/A 06/01/2013    Procedure: PERMANENT PACEMAKER GENERATOR CHANGE;  Surgeon: [name], MD;  Location: [facility] CATH LAB;  Service: Cardiovascular;  Laterality: N/A;     Family History  Problem Relation Age of Onset  . Tuberculosis Father     arrested  . Cancer Father   . Aortic stenosis Mother     ASCVD     History   Social History  . Marital Status: Married    Spouse Name: N/A    Number of Children: N/A  . Years of Education: N/A   Occupational History  . retired     [employer]   Social History Main Topics  . Smoking status: Former Smoker -- 2.00 packs/day for 55 years    Types: Cigarettes    Quit date: 10/19/1997  . Smokeless tobacco: Never Used  . Alcohol Use: Yes     Comment: yes "rarely"  . Drug Use: No  . Sexual  Activity: Not on file   Other Topics Concern  . Not on file   Social History Narrative     BP 124/62 mmHg  Pulse 85  Ht 5\' 7"  (1.702 m)  Wt 215 lb 9.6 oz (97.796 kg)   BMI 33.76 kg/m2  Physical Exam:  Well appearing elderly man, NAD HEENT: Unremarkable Neck:  No JVD, no thyromegally Back:  No CVA tenderness Lungs:  Clear with no wheezes HEART:  Regular rate rhythm, no murmurs, no rubs, no clicks Abd:  soft, obese, positive bowel sounds, no organomegally, no rebound, no guarding Ext:  2 plus pulses, no edema, no cyanosis, no clubbing Skin:  No rashes no nodules Neuro:  CN II through XII intact, motor grossly intact  DEVICE  Normal device function.  See PaceArt for details.   Assess/Plan:

## 2014-07-25 NOTE — Assessment & Plan Note (Signed)
His ventricular rate is well controlled. Will follow.  

## 2014-07-25 NOTE — Assessment & Plan Note (Signed)
His medtronic VVI PM is working normally. Will recheck in several months.

## 2014-07-25 NOTE — Patient Instructions (Signed)
Your physician wants you to follow-up in: 12 months with Dr. Knox Saliva will receive a reminder letter in the mail two months in advance. If you don't receive a letter, please call our office to schedule the follow-up appointment.  Remote monitoring is used to monitor your Pacemaker or ICD from home. This monitoring reduces the number of office visits required to check your device to one time per year. It allows [place] to keep an eye on the functioning of your device to ensure it is working properly. You are scheduled for a device check from home on 10/24/14. You may send your transmission at any time that day. If you have a wireless device, the transmission will be sent automatically. After your physician reviews your transmission, you will receive a postcard with your next transmission date.

## 2014-07-25 NOTE — Assessment & Plan Note (Signed)
He is s/p XRT and doing well. A repeat CT scan is planned in 4 months.

## 2014-07-25 NOTE — Assessment & Plan Note (Signed)
I discussed the importance of weight loss. He is planning on starting a diet.

## 2014-07-26 ENCOUNTER — Encounter: Payer: Self-pay | Admitting: Cardiology

## 2014-08-08 ENCOUNTER — Other Ambulatory Visit: Payer: Self-pay | Admitting: Cardiology

## 2014-08-10 ENCOUNTER — Ambulatory Visit (INDEPENDENT_AMBULATORY_CARE_PROVIDER_SITE_OTHER): Payer: Medicare Other | Admitting: *Deleted

## 2014-08-10 DIAGNOSIS — Z5181 Encounter for therapeutic drug level monitoring: Secondary | ICD-10-CM

## 2014-08-10 DIAGNOSIS — I4891 Unspecified atrial fibrillation: Secondary | ICD-10-CM

## 2014-08-10 LAB — POCT INR: INR: 2.6

## 2014-09-18 ENCOUNTER — Ambulatory Visit: Payer: Medicare Other | Admitting: Podiatry

## 2014-09-19 ENCOUNTER — Other Ambulatory Visit: Payer: Self-pay | Admitting: Internal Medicine

## 2014-09-20 ENCOUNTER — Ambulatory Visit: Payer: Medicare Other | Admitting: Podiatry

## 2014-09-21 ENCOUNTER — Ambulatory Visit (INDEPENDENT_AMBULATORY_CARE_PROVIDER_SITE_OTHER): Payer: Medicare Other | Admitting: *Deleted

## 2014-09-21 DIAGNOSIS — Z5181 Encounter for therapeutic drug level monitoring: Secondary | ICD-10-CM

## 2014-09-21 DIAGNOSIS — I4891 Unspecified atrial fibrillation: Secondary | ICD-10-CM

## 2014-09-21 LAB — POCT INR: INR: 2.7

## 2014-09-27 ENCOUNTER — Ambulatory Visit (INDEPENDENT_AMBULATORY_CARE_PROVIDER_SITE_OTHER): Payer: Medicare Other | Admitting: Podiatry

## 2014-09-27 ENCOUNTER — Encounter: Payer: Self-pay | Admitting: Podiatry

## 2014-09-27 DIAGNOSIS — B351 Tinea unguium: Secondary | ICD-10-CM | POA: Diagnosis not present

## 2014-09-27 DIAGNOSIS — M79676 Pain in unspecified toe(s): Secondary | ICD-10-CM | POA: Diagnosis not present

## 2014-09-27 NOTE — Patient Instructions (Signed)
Apply topical antibiotic ointment to the fourth left toe daily until a scab forms

## 2014-09-27 NOTE — Progress Notes (Signed)
Patient ID: [name] Gill, male   DOB: May 23, 1926, 81 y.o.   MRN: [id number]  Subjective: This patient presents complaining of painful toenails and walking wearing shoes and requesting nail debridement  Objective: The toenails are hypertrophic, elongated, discolored, incurvated and tender to palpation 6-10  Assessment: Symptomatic onychomycoses 6-10  Plan: Debridement toenails 10 Pinpoint bleeding distal fourth left toe treated with topical antibiotic ointment and Band-Aid  Patient advised to continue apply topical antibiotic ointment and Band-Aid fourth left toe daily until healed  Reappoint 3 months

## 2014-10-02 NOTE — Progress Notes (Signed)
Cardiology Office Note   Date:  10/03/2014   ID:  [name], DOB 1926-07-04, MRN [id number]  PCP:  Mathews Argyle, MD  Cardiologist:   Sueanne Margarita, MD   Chief Complaint  Patient presents with  . Coronary Artery Disease  . Hypertension  . Hyperlipidemia  . Atrial Fibrillation      History of Present Illness: [name] Gill is a 81 y.o. male with a history of ASCAD, s/p CABG, dyslipidemia, HTN, chronic atrial fibrillation, chronic systemic anticoagulation and tachybrady syndrome s/p PPM who presents for followup. He had a scan done of his neck and chest for a parotid gland mass and the CT showed a spot on his lung and just had a PET scan done and then lung biopsy by Dr. Servando Snare and was diagnosed with Stage I lung cancer and had a resection. He underwent XRT and is doing well. He denies any chest pain, SOB, DOE, palpitations, dizziness or syncope. He occasionally has some LE edema.     Past Medical History  Diagnosis Date  . Atrial fibrillation     coumadin, pacemaker/ CHRONIC  . Diverticulitis 1975  . Hypertension   . COPD (chronic obstructive pulmonary disease)     FEV1 1.35/58% 01-11-10  . Pacemaker -Medtronic     Medtronic  . Ventricular tachycardia, non-sustained     Detected on the device  . Hyperlipidemia     goal LDL less than 70  . PVD (peripheral vascular disease)     w mod left external carotid artery stenosis.   . Obesity   . Ichthyosis   . Renal insufficiency     stage III   . Hyperglycemia     mild glucose 111 on 11/2010  . H/O echocardiogram     nl LVF w severe ASSH and mod cLVH, mildly dilated aorta, milf LAE, mild MR/TR and moderate AVSC  . Incisional hernia     2 upper and 1 lower abdominal incisional hernia.   . Low testosterone     start androgel  . Tachycardia-bradycardia syndrome     s/p PPM  . Chronic anticoagulation   . CHD (coronary heart disease)     s/p CABG with patents grafts at cath 2009  . Aortic root dilatation   .  CHF (congestive heart failure)   . Obesity   . Cataract      no surgery  . GERD (gastroesophageal reflux disease)   . Dysrhythmia     afib  . OSA (obstructive sleep apnea)     04-04-08 NPSG AHI 14.5, 3L/M suppl O2 for nadir 86%  . H/O hiatal hernia   . Arthritis   . Anemia     hx  . Malignant neoplasm of lower lobe, bronchus, or lung 11/22/2013  . Radiation 5/5,57/,5/11,5/13,5/152015    right upper lobe 60 Gy in 5 fractions    Past Surgical History  Procedure Laterality Date  . Broken nose    . Pacemaker insertion  02/2008    medtronic  . Coronary artery bypass graft  0263,7858    coronary disease s/p CABG 2004 and Cath 2009  . Cardiac catheterization  2009  . Pacemaker insertion  11/14  . Colon surgery  85    diverticulitis  . Back surgery      herniated disc  . Hernia repair    . Parotidectomy Left 12/15/2013    WITH NECK DISECTION  . Parotidectomy Left 12/15/2013    Procedure: SUPEFICIAL PAROTIDECTOMY WITH FACIAL NERVE DISSECTION;  Surgeon: [name], MD;  Location: [facility];  Service: ENT;  Laterality: Left;  . Permanent pacemaker generator change N/A 06/01/2013    Procedure: PERMANENT PACEMAKER GENERATOR CHANGE;  Surgeon: [name], MD;  Location: [facility] CATH LAB;  Service: Cardiovascular;  Laterality: N/A;     Current Outpatient Prescriptions  Medication Sig Dispense Refill  . Acai 500 MG CAPS Take 500 mg by mouth 2 (two) times daily.    . Ascorbic Acid (VITAMIN C) 500 MG tablet Take 500 mg by mouth daily.      [name] atorvastatin (LIPITOR) 80 MG tablet Take 80 mg by mouth daily.     . beta carotene w/minerals (OCUVITE) tablet Take 2 tablets by mouth daily.     [name] CARTIA XT 180 MG 24 hr capsule TAKE 1 CAPSULE BY MOUTH DAILY 30 capsule 5  . Cholecalciferol (VITAMIN D PO) Take 1 tablet by mouth daily.     [name] co-enzyme Q-10 30 MG capsule Take 2 capsules by mouth 2 (two) times daily.     . fenofibrate 54 MG tablet Take 54 mg by mouth Daily.     [name] FOLIC ACID PO Take 1  tablet by mouth daily.     . furosemide (LASIX) 80 MG tablet Take one tablet by mouth every morning and 1/2 of a tablet by mouth every afternoon. 135 tablet 3  . GRAPE SEED EXTRACT PO Take 1 capsule by mouth 2 (two) times daily.    . Lycopene 10 MG CAPS Take 10 mg by mouth daily.    . Misc Natural Products (BLACK CHERRY CONCENTRATE PO) Take 2 capsules by mouth daily.    . Multiple Vitamin (MULTIVITAMIN PO) Take 1 tablet by mouth daily.     [name] NITROSTAT 0.4 MG SL tablet Place 0.4 mg under the tongue every 5 (five) minutes as needed for chest pain. Take as directed    . Omega-3 Fatty Acids (FISH OIL) 1000 MG CAPS Take 3,000 mg by mouth daily.     [name] OVER THE COUNTER MEDICATION Take 4-5 oz by mouth every evening. "Diet tonic water"- quinine    . pantoprazole (PROTONIX) 40 MG tablet Take 40 mg by mouth daily.     . potassium chloride (KLOR-CON 10) 10 MEQ CR tablet Take 10 mEq by mouth daily.      [name] RESVERATROL PO Take 1 capsule by mouth 2 (two) times daily.    . saw palmetto 160 MG capsule Take 800 mg by mouth daily. Takes 5 capsules daily    . SELENIUM PO Take 1 tablet by mouth daily.     [name] SPIRIVA HANDIHALER 18 MCG inhalation capsule INHALE CONTENTS OF 1 CAPSULE BY MOUTH DAILY AS DIRECTED 90 capsule 1  . SYMBICORT 160-4.5 MCG/ACT inhaler INHALE 2 PUFFS INTO THE LUNGS 2 TIMES DAILY 10.2 g 3  . TURMERIC PO Take 2 capsules by mouth daily.     [name] warfarin (COUMADIN) 2.5 MG tablet TAKE AS DIRECTED BY THE COUMADIN CLINIC *THIS IS A 30 DAYS SUPPLY* 60 tablet 2   No current facility-administered medications for this visit.    Allergies:   Review of patient's allergies indicates no known allergies.    Social History:  The patient  reports that he quit smoking about 16 years ago. His smoking use included Cigarettes. He has a 110 pack-year smoking history. He has never used smokeless tobacco. He reports that he drinks alcohol. He reports that he does not use illicit drugs.   Family History:  The patient's  family history includes Aortic stenosis in his mother; Cancer in his father; Tuberculosis in his father.    ROS:  Please see the history of present illness.   Otherwise, review of systems are positive for none.   All other systems are reviewed and negative.    PHYSICAL EXAM: VS:  BP 122/68 mmHg  Pulse 80  Ht 5\' 7"  (1.702 m)  Wt 217 lb 6.4 oz (98.612 kg)  BMI 34.04 kg/m2 , BMI Body mass index is 34.04 kg/(m^2). GEN: Well nourished, well developed, in no acute distress HEENT: normal Neck: no JVD, carotid bruits, or masses Cardiac: RRR; no murmurs, rubs, or gallops.  Trace edema Respiratory:  clear to auscultation bilaterally, normal work of breathing GI: soft, nontender, nondistended, + BS MS: no deformity or atrophy Neuro:  Strength and sensation are intact Psych: euthymic mood, full affect   EKG:  EKG was ordered today and showed atrial fibrillation with RBBB and ST/T wave abnormality in the precordial leads and inferiorly    Recent Labs: 10/18/2013: Pro B Natriuretic peptide (BNP) 327.0* 12/14/2013: Hemoglobin 11.3*; Platelets 261 04/14/2014: ALT 13; BUN 35*; Creatinine 1.5; Potassium 4.2; Sodium 140    Lipid Panel    Component Value Date/Time   CHOL 114 04/14/2014 0852   TRIG 77.0 04/14/2014 0852   HDL 28.40* 04/14/2014 0852   CHOLHDL 4 04/14/2014 0852   VLDL 15.4 04/14/2014 0852   LDLCALC 70 04/14/2014 0852      Wt Readings from Last 3 Encounters:  10/03/14 217 lb 6.4 oz (98.612 kg)  07/25/14 215 lb 9.6 oz (97.796 kg)  05/29/14 215 lb 12.8 oz (97.886 kg)       ASSESSMENT AND PLAN:  1. ASCAD with no angina - not on ASA due to warfarin 2. Chronic atrial fibrillation - rate controlled  - continue warfarin/Cartia 3. Dyslipidemia - LDL at goal 03/2014 - continue atorvastatin/fenofibrate - check FLP and ALT 4. Chronic systemic anticoagulation 5. HTN well controlled  - continue Diltiazem  6. Tachybrady syndrome s/p PPM 7. Mildly dilated aortic root -  normal size on last echo 8. Chronic LE edema  - continue Lasix   - check BMET  Current medicines are reviewed at length with the patient today.  The patient does not have concerns regarding medicines.  The following changes have been made:  no change  Labs/ tests ordered today include: Bmet, FLP, ALT   Orders Placed This Encounter  Procedures  . Basic metabolic panel  . Hepatic function panel  . Lipid Profile  . EKG 12-Lead     Disposition:   FU with me in 6 months   Signed, Sueanne Margarita, MD  10/03/2014 10:08 AM    [facility] [address], [place], [place]  [address] Phone: [phone]; Fax: [phone]

## 2014-10-03 ENCOUNTER — Ambulatory Visit (INDEPENDENT_AMBULATORY_CARE_PROVIDER_SITE_OTHER): Payer: Medicare Other | Admitting: Cardiology

## 2014-10-03 ENCOUNTER — Encounter: Payer: Self-pay | Admitting: Cardiology

## 2014-10-03 VITALS — BP 122/68 | HR 80 | Ht 67.0 in | Wt 217.4 lb

## 2014-10-03 DIAGNOSIS — I2510 Atherosclerotic heart disease of native coronary artery without angina pectoris: Secondary | ICD-10-CM

## 2014-10-03 DIAGNOSIS — I495 Sick sinus syndrome: Secondary | ICD-10-CM

## 2014-10-03 DIAGNOSIS — E785 Hyperlipidemia, unspecified: Secondary | ICD-10-CM | POA: Diagnosis not present

## 2014-10-03 DIAGNOSIS — I482 Chronic atrial fibrillation, unspecified: Secondary | ICD-10-CM

## 2014-10-03 DIAGNOSIS — I2583 Coronary atherosclerosis due to lipid rich plaque: Secondary | ICD-10-CM

## 2014-10-03 NOTE — Patient Instructions (Signed)
Your physician recommends that you return for FASTING lab work on Thursday, March 17. You may come ANY TIME between 7:30 AM and 5:00 PM.  Your physician wants you to follow-up in: 6 months with Dr. Radford Pax. You will receive a reminder letter in the mail two months in advance. If you don't receive a letter, please call our office to schedule the follow-up appointment.

## 2014-10-05 ENCOUNTER — Other Ambulatory Visit (INDEPENDENT_AMBULATORY_CARE_PROVIDER_SITE_OTHER): Payer: Medicare Other | Admitting: *Deleted

## 2014-10-05 DIAGNOSIS — I482 Chronic atrial fibrillation, unspecified: Secondary | ICD-10-CM

## 2014-10-05 DIAGNOSIS — E785 Hyperlipidemia, unspecified: Secondary | ICD-10-CM | POA: Diagnosis not present

## 2014-10-05 LAB — BASIC METABOLIC PANEL
BUN: 29 mg/dL — AB (ref 6–23)
CO2: 28 meq/L (ref 19–32)
CREATININE: 1.37 mg/dL (ref 0.40–1.50)
Calcium: 8.7 mg/dL (ref 8.4–10.5)
Chloride: 107 mEq/L (ref 96–112)
GFR: 52.28 mL/min — ABNORMAL LOW (ref >60.00)
GLUCOSE: 88 mg/dL (ref 70–99)
POTASSIUM: 4.1 meq/L (ref 3.5–5.1)
SODIUM: 141 meq/L (ref 135–145)

## 2014-10-05 LAB — LIPID PANEL
CHOL/HDL RATIO: 3
Cholesterol: 97 mg/dL (ref 0–200)
HDL: 31.5 mg/dL — AB (ref >39.00)
LDL Cholesterol: 50 mg/dL (ref 0–99)
NonHDL: 65.5
Triglycerides: 76 mg/dL (ref 0.0–149.0)
VLDL: 15.2 mg/dL (ref 0.0–40.0)

## 2014-10-05 LAB — HEPATIC FUNCTION PANEL
ALBUMIN: 3.7 g/dL (ref 3.5–5.2)
ALT: 15 U/L (ref 0–53)
AST: 19 U/L (ref 0–37)
Alkaline Phosphatase: 35 U/L — ABNORMAL LOW (ref 39–117)
BILIRUBIN TOTAL: 0.4 mg/dL (ref 0.2–1.2)
Bilirubin, Direct: 0.1 mg/dL (ref 0.0–0.3)
Total Protein: 6.6 g/dL (ref 6.0–8.3)

## 2014-10-05 NOTE — Addendum Note (Signed)
Addended by: Eulis Foster on: 10/05/2014 08:09 AM   Modules accepted: Orders

## 2014-10-10 ENCOUNTER — Other Ambulatory Visit: Payer: Self-pay | Admitting: Cardiology

## 2014-10-24 ENCOUNTER — Ambulatory Visit (INDEPENDENT_AMBULATORY_CARE_PROVIDER_SITE_OTHER): Payer: Medicare Other | Admitting: *Deleted

## 2014-10-24 DIAGNOSIS — I495 Sick sinus syndrome: Secondary | ICD-10-CM

## 2014-10-24 LAB — MDC_IDC_ENUM_SESS_TYPE_REMOTE
Battery Impedance: 187 Ohm
Battery Remaining Longevity: 118 mo
Battery Voltage: 2.79 V
Brady Statistic RV Percent Paced: 29 %
Lead Channel Impedance Value: 0 Ohm
Lead Channel Pacing Threshold Amplitude: 0.5 V
Lead Channel Setting Pacing Amplitude: 2 V
Lead Channel Setting Pacing Pulse Width: 0.4 ms
Lead Channel Setting Sensing Sensitivity: 4 mV
MDC IDC MSMT LEADCHNL RV IMPEDANCE VALUE: 531 Ohm
MDC IDC MSMT LEADCHNL RV PACING THRESHOLD PULSEWIDTH: 0.4 ms
MDC IDC MSMT LEADCHNL RV SENSING INTR AMPL: 8 mV
MDC IDC SESS DTM: 20160405123456

## 2014-10-24 NOTE — Progress Notes (Signed)
Remote pacemaker transmission.   

## 2014-11-02 ENCOUNTER — Ambulatory Visit (INDEPENDENT_AMBULATORY_CARE_PROVIDER_SITE_OTHER): Payer: Medicare Other

## 2014-11-02 ENCOUNTER — Encounter: Payer: Self-pay | Admitting: Cardiology

## 2014-11-02 DIAGNOSIS — Z5181 Encounter for therapeutic drug level monitoring: Secondary | ICD-10-CM | POA: Diagnosis not present

## 2014-11-02 DIAGNOSIS — I4891 Unspecified atrial fibrillation: Secondary | ICD-10-CM | POA: Diagnosis not present

## 2014-11-02 LAB — POCT INR: INR: 1.9

## 2014-11-07 ENCOUNTER — Encounter: Payer: Self-pay | Admitting: Internal Medicine

## 2014-11-16 ENCOUNTER — Encounter (HOSPITAL_COMMUNITY): Payer: Self-pay

## 2014-11-16 ENCOUNTER — Ambulatory Visit (HOSPITAL_COMMUNITY)
Admission: RE | Admit: 2014-11-16 | Discharge: 2014-11-16 | Disposition: A | Discharge status: Home / Self Care | Payer: Medicare Other | Source: Ambulatory Visit | Attending: Radiation Oncology | Admitting: Radiation Oncology

## 2014-11-16 ENCOUNTER — Encounter: Payer: Self-pay | Admitting: Cardiology

## 2014-11-16 DIAGNOSIS — C3431 Malignant neoplasm of lower lobe, right bronchus or lung: Secondary | ICD-10-CM | POA: Diagnosis present

## 2014-11-16 DIAGNOSIS — Z923 Personal history of irradiation: Secondary | ICD-10-CM | POA: Insufficient documentation

## 2014-11-16 MED ORDER — IOHEXOL 300 MG/ML  SOLN
50.0000 mL | Freq: Once | INTRAMUSCULAR | Status: AC | PRN
  Administered 2014-11-16: 50 mL via ORAL

## 2014-11-16 NOTE — Progress Notes (Signed)
Department of Radiation Oncology  Phone:  [phone] Fax:        [phone]   Name: [name] Gill MRN: [id number]  DOB: 05-03-1926  Date: 11/17/2014  Follow Up Visit Note  Diagnosis: Stage I NSCLC of th right upper lobe  Summary and Interval since last radiation: 60 Gy in 5 fractions completed 12/15/13  Interval History: [name] Gill presents today for routine followup. He is feeling well. His daughter is unfortunately in the hospital. His CT of the chest and abdomen looks good. The leions in his pancreas are cysts. His left upper lobe scarring is stable. The nodule in the right lung is smaller. He has no cough and no hemoptysis.   Allergies: No Known Allergies  Medications:  Current Outpatient Prescriptions  Medication Sig Dispense Refill  . Acai 500 MG CAPS Take 500 mg by mouth 2 (two) times daily.    . Ascorbic Acid (VITAMIN C) 500 MG tablet Take 500 mg by mouth daily.      [name] atorvastatin (LIPITOR) 80 MG tablet Take 80 mg by mouth daily.     . beta carotene w/minerals (OCUVITE) tablet Take 2 tablets by mouth daily.     [name] CARTIA XT 180 MG 24 hr capsule TAKE 1 CAPSULE BY MOUTH DAILY 30 capsule 5  . Cholecalciferol (VITAMIN D PO) Take 1 tablet by mouth daily.     [name] co-enzyme Q-10 30 MG capsule Take 2 capsules by mouth 2 (two) times daily.     . fenofibrate 54 MG tablet Take 54 mg by mouth Daily.     [name] FOLIC ACID PO Take 1 tablet by mouth daily.     . furosemide (LASIX) 80 MG tablet Take one tablet by mouth every morning and 1/2 of a tablet by mouth every afternoon. 135 tablet 3  . GRAPE SEED EXTRACT PO Take 1 capsule by mouth 2 (two) times daily.    . Lycopene 10 MG CAPS Take 10 mg by mouth daily.    . Misc Natural Products (BLACK CHERRY CONCENTRATE PO) Take 2 capsules by mouth daily.    . Multiple Vitamin (MULTIVITAMIN PO) Take 1 tablet by mouth daily.     [name] NITROSTAT 0.4 MG SL tablet Place 0.4 mg under the tongue every 5 (five) minutes as needed for chest pain. Take as directed     . Omega-3 Fatty Acids (FISH OIL) 1000 MG CAPS Take 3,000 mg by mouth daily.     [name] OVER THE COUNTER MEDICATION Take 4-5 oz by mouth every evening. "Diet tonic water"- quinine    . pantoprazole (PROTONIX) 40 MG tablet Take 40 mg by mouth daily.     . potassium chloride (KLOR-CON 10) 10 MEQ CR tablet Take 10 mEq by mouth daily.      [name] RESVERATROL PO Take 1 capsule by mouth 2 (two) times daily.    . saw palmetto 160 MG capsule Take 800 mg by mouth daily. Takes 5 capsules daily    . SELENIUM PO Take 1 tablet by mouth daily.     [name] SPIRIVA HANDIHALER 18 MCG inhalation capsule INHALE CONTENTS OF 1 CAPSULE BY MOUTH DAILY AS DIRECTED 90 capsule 1  . SYMBICORT 160-4.5 MCG/ACT inhaler INHALE 2 PUFFS INTO THE LUNGS 2 TIMES DAILY 10.2 g 3  . TURMERIC PO Take 2 capsules by mouth daily.     [name] warfarin (COUMADIN) 2.5 MG tablet TAKE AS DIRECTED BY THE COUMADIN CLINIC *THIS IS A 30 DAYS SUPPLY* 60 tablet 2   No  current facility-administered medications for this encounter.    Physical Exam:  Pleasant male in no distress.   IMPRESSION: [name] Gill is a 81 y.o. male a/p SBRT with no evidence of disease  PLAN:  Follow up in 6 months with a CT chest. Pt was given a copy of his scan results.   Thea Silversmith, MD

## 2014-11-17 ENCOUNTER — Encounter: Payer: Self-pay | Admitting: Radiation Oncology

## 2014-11-17 ENCOUNTER — Ambulatory Visit
Admission: RE | Admit: 2014-11-17 | Discharge: 2014-11-17 | Disposition: A | Discharge status: Home / Self Care | Payer: Medicare Other | Source: Ambulatory Visit | Attending: Radiation Oncology | Admitting: Radiation Oncology

## 2014-11-17 VITALS — BP 137/61 | HR 86 | Temp 97.7°F | Resp 22 | Ht 67.0 in | Wt 216.2 lb

## 2014-11-17 DIAGNOSIS — C3411 Malignant neoplasm of upper lobe, right bronchus or lung: Secondary | ICD-10-CM

## 2014-11-17 NOTE — Progress Notes (Signed)
Follow upo s/p rad txs right upper lung, last treatment completed 12/15/13 Had Ct  Chest/abd   11/16/14 here for results, no c/o 3:17 PM BP 137/61 mmHg  Pulse 86  Temp(Src) 97.7 F (36.5 C) (Oral)  Resp 22  Ht '5\' 7"'$  (1.702 m)  Wt 216 lb 3.2 oz (98.068 kg)  BMI 33.85 kg/m2  SpO2 97%  Wt Readings from Last 3 Encounters:  10/03/14 217 lb 6.4 oz (98.612 kg)  07/25/14 215 lb 9.6 oz (97.796 kg)  05/29/14 215 lb 12.8 oz (97.886 kg)

## 2014-11-20 ENCOUNTER — Telehealth: Payer: Self-pay | Admitting: *Deleted

## 2014-11-20 NOTE — Telephone Encounter (Signed)
CALLED PATIENT TO INFORM OF LAB, TEST AND FU, LVM FOR A RETURN CALL 

## 2014-11-27 ENCOUNTER — Ambulatory Visit (INDEPENDENT_AMBULATORY_CARE_PROVIDER_SITE_OTHER): Payer: Medicare Other | Admitting: Internal Medicine

## 2014-11-27 ENCOUNTER — Encounter: Payer: Self-pay | Admitting: Internal Medicine

## 2014-11-27 VITALS — BP 124/76 | HR 87 | Ht 66.0 in | Wt 215.8 lb

## 2014-11-27 DIAGNOSIS — G4733 Obstructive sleep apnea (adult) (pediatric): Secondary | ICD-10-CM | POA: Diagnosis not present

## 2014-11-27 DIAGNOSIS — I2510 Atherosclerotic heart disease of native coronary artery without angina pectoris: Secondary | ICD-10-CM

## 2014-11-27 DIAGNOSIS — J432 Centrilobular emphysema: Secondary | ICD-10-CM

## 2014-11-27 DIAGNOSIS — C3431 Malignant neoplasm of lower lobe, right bronchus or lung: Secondary | ICD-10-CM

## 2014-11-27 NOTE — Progress Notes (Addendum)
[name], MD 11/18/2011 9:09 PM Pended  Patient ID: [name] Gill, [name] Gill DOB: [birth year], 81 y.o. MRN: [id number]  HPI  43 yoM followed for OSA and COPD complicated by CAD and AFib/ pacemaker. with obesity hypoventilation syndrome. Last here May 15, 2010. Since then he finished Pulmonary Rehab and says it was life changing. Pacing himself he can now walk around the block with a few rest stops. We talked about ways to maintain the momentum. Otherwise quiet winter- had one respiratory infection- not bad. Heart ok- no new problems or changes by Dr Radford Pax. FEV1 was 1.35/ 58% in 2011. Dr Felipa Eth did CXR during the winter.  He continues BIPAP 11/9 with O2 2 L/M, used at least 5-6 hours every night.  Denies routine cough, phlegm, chest pain, palpitation, bloody or purulent sputum.   05/16/11- 36 yoM followed for OSA and COPD complicated by CAD and AFib/ pacemaker, obesity hypoventilation syndrome.  Wife is here. Had flu shot and shingles vaccine. He feels he is doing very well. Pulmonary rehabilitation was a big help and he has lost 28 pounds since April. He is now in the maintenance program. Comfortable off of oxygen in the daytime. Using Spiriva and Symbicort. Never aware of wheeze. Denies cough or chest pain. He has an old nebulizer machine and rescue inhaler that he never needs. He doesn't notice that he wheezes.  He continues BiPAP 11/9 with oxygen at 2 L, every night for at least 5 or 6 hours. His wife comments that he now stays asleep on long drives and she thinks he is doing very well.   11/14/11- 52 yoM followed for OSA and COPD complicated by CAD and AFib/ pacemaker, obesity hypoventilation syndrome.  He continues to use BiPAP with inspiratory pressure 11/expiratory pressure 9 and says he is doing well with this.  He considers his COPD status "wonderful". Pulmonary rehabilitation as a big help and has made a significant difference in his strength over the past year. Has lost some weight.  Little cough or wheeze. Sleeps with oxygen 2 L. Using Symbicort twice daily and Spiriva. Occasional epistaxis and nasal stuffiness attributed to old nasal trauma.  11/18/11- 46 yoM followed for OSA and COPD complicated by CAD and AFib/ pacemaker, obesity hypoventilation syndrome.  PCP Dr Felipa Eth Just here 4 days ago. Wife brings him in today because he didn't tell me at last visit about recent problems she wanted me to know about. Oxygen saturation had been running 96-98% but has been lower in the past few days. Malaise. Had a gastroenteritis syndrome February 27 and similar March 21. In the last few days has felt tired, chills, feet swelling, stomach bloating. More short of breath off and on. Denies pain, fever, purulent discharge, leg pain or cramps. Was taking AndroGel but stopped last week. Has been eating a lot of licorice, up to 20 sticks/ day over the past month, initially to treat constipation. Then he just kept on because he liked it. Has history of mild renal insufficiency. Has been eating a banana a day and potassium supplement. He reports that the pulmonary rehabilitation nurse thought he might be fluid overloaded. In the last 2 days he had been eating boullion, accepting salt input in order to lose weight by avoiding sandwiches.  05/21/12- 10 yoM followed for OSA and COPD complicated by CAD and AFib/ pacemaker, obesity hypoventilation syndrome.  PCP Dr Felipa Eth   had flu vaccine Feels no longer needs O2-been about 4-5 months since used last; is having severe dry mouth(caused  tooth problems); would like to have another mask (not full face mask-[facility]). Had lost weight and since then has not used oxygen at all. Treated for atrial fibrillation with pacemaker. Doing very well and pulmonary rehabilitation program. Does not require oxygen during that exercise. Continues BiPAP 11/9/[facility] with no humidifier. I discussed the humidifier given his concern about dry mouth. COPD assessment test (CAT)  3/40  11/24/12 87 yoM followed for OSA and COPD complicated by CAD and AFib/ pacemaker, obesity hypoventilation syndrome     PCP Dr Felipa Eth FOLLOWS FOR: patient reports he is feeling much better with his breathing esp after losing 60lbs-- c/o excessive dry mouth--wearing BiPAP11/9  every night for approx 4 hrs per night- tolerating pressure ok He had lost weight, about 25 pounds by our scale while working in pulmonary rehabilitation. He admits he is gained some back. He is now going to [employer]. Denies cough wheeze, edema or chest pain. Using a nasal decongestant spray about twice daily and we discussed this, recommending he cut way down. CXR 11/19/11 IMPRESSION:  Mild basilar predominant pulmonary edema and small bilateral  effusions.  Original Report Authenticated By: Duayne Cal, M.D.  05/27/13- 73 yoM former smoker hefollowed for OSA and COPD complicated by CAD and AFib/ pacemaker, obesity hypoventilation syndrome     PCP Dr Felipa Eth FOLLOWS FOR:  Pt states he did have "cold" and was recently treated with abx. Wears BiPAP  11/9every night for about 4-5 hours; gets extreme dry mouth-has tried biotene since last visit and helps slightly. DME is [facility]. Dryness accentuated by furosemide and Spiriva. Still doing "Silver sneakers" As he was getting over a recent viral pattern cold syndrome he found a lymph node left neck -not tender CXR 5/ 7 / 14 IMPRESSION:  Mild chronic lung disease with bibasilar scarring. No acute  cardiopulmonary abnormality.  Original Report Authenticated By: Carl Best, M.D.  10/06/13- 51 yoM former smoker followed for OSA and COPD complicated by CAD and AFib/ pacemaker, obesity hypoventilation syndrome      PCP Dr Caleen Essex and wife here. L parotid lesion being followed by Dr Lucia Gaskins ENT. Work-up led to CXR and CT chest- RUL nodule w/ central cavitation. PET positive w/o evident mets. We reviewed images together. He denies unusual dyspnea, chest pain,  cough or wheeze and family concurs. No blood and no nodes or masses except L parotid.  He has f/u appt tomorrow w/ Dr Radford Pax Cardiology. BIPAP 11/9 with O2 2L for sleep/ [facility].  CT neck 09/07/13 IMPRESSION:  1. Within the left parotid gland primarily in the superficial lobe  there are 2 adjacent circumscribed soft tissue masses. The larger  more inferior measures up to 22 mm diameter. 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  2. No regional lymphadenopathy. No other neck mass.  3. There is a partially visible 13 mm right lung nodule, not evident  on the 11/24/2012 chest radiographs. Recommend followup chest CT (IV  contrast preferred but could be omitted if the patient has any  history of renal insufficiency) to evaluate persistence and/or  characterize further.  4. 15 mm right thyroid nodule, significance doubtful in this age  group.  Electronically Signed  By: Lars Pinks M.D.  On: 09/07/2013 12:20 CT CHEST WITH CONTRAST 09/20/13 IMPRESSION:  1. Centrally cavitary right upper lobe pulmonary nodule. Suspicious  for primary bronchogenic carcinoma. This is superimposed upon mild  to moderate centrilobular emphysema. Consider further  evaluation  with PET.  2. Probable scarring at the left apex. This is somewhat nodular more  medially. This could be re-evaluated at followup PET.  3. Cardiomegaly with small bilateral pleural effusions, suggesting a  component of congestive heart failure/fluid overload.  4. Thoracic adenopathy. This is nonspecific in the setting of  congestive heart failure. Could be secondary. However, metastatic  disease cannot be excluded.  5. Pulmonary artery enlargement suggests pulmonary arterial  hypertension.  6. Cholelithiasis.  7. Incompletely imaged transverse colon containing ventral abdominal  wall hernia.  8. Indeterminate right-sided thyroid nodule. This  could be further  evaluated with thyroid ultrasound. However, given patient age and  other comorbidities, of questionable significance.  Electronically Signed  By: Abigail Miyamoto M.D.  On: 09/23/2013 10:17 PET 09/27/13 IMPRESSION:  1. Hypermetabolism corresponding to the cavitary posterior right  upper lobe lung nodule. Most consistent with primary bronchogenic  carcinoma. The thoracic nodes are not hypermetabolic and are  decreased in size. There were likely related to a congestive heart  failure exacerbation. Therefore, presuming non-small-cell histology,  most consistent with T1aN0M0 or stage IA.  2. Hypermetabolic left parotid nodules. 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 Please see neck CT report of 09/07/2013.  3. Hypermetabolism at the left side of the vallecula. Favored to be  physiologic and related to secretions in this area. This area is  underdistended. This could be re-evaluated on follow-up PET or if  there is a clinical concern of mucosal lesion, direct visualization  performed.  4. Incidental findings, including common iliac artery dilatation,  gallstones, and ventral abdominal wall hernias.  Electronically Signed  By: Abigail Miyamoto M.D.  On: 10/04/2013 16:31  11/24/13- 43 yoM former smoker followed for OSA, COPD, cavitary lung nodule/ Sq Cell Ca/ XRT, complicated by CAD and AFib/ pacemaker, obesity hypoventilation syndrome     PCP Dr Felipa Eth FOLLOWS FOR:  Pt reports breathing is doing well and has no concerns today Gill bx of RUL nodule + squamous cell Ca on 4/17. Has started XRT.  Breathing is comfortable with little cough or wheeze now. He no longer has oxygen for sleep BiPAP 11/9-[facility] all night every night  PFT 01/11/10- moderate obstructive airways disease, insignificant response to bronchodilator. FVC 2.28/ 62%, FEV1 1.35/ 58%, FEV1/FVC 0.59, TLC 90%, DLCO 57%  05/29/14- 88 yoM former smoker followed for OSA and COPD complicated by CAD and AFib/  pacemaker, obesity hypoventilation syndrome , CHF,cavitary RUL squamous cell ca, parotid neoplasm    PCP Dr Felipa Eth BIPAP 11/9 Wears BiPAP every night through [place].  Pt states his breathing has been well since last visit; has had multiple health issues and surgeries since last visit.  Finished XRT  For R lung cancer, had basal cell carcinoma resected from scalp and benign left parotid tumor resected. CT chest 01/17/14 IMPRESSION: 1. Reduced size of the right upper lobe pulmonary nodule, indicating interval effective therapy. 2. Subtle 9 mm new ground-glass opacity in the right lower lobe, probably postinflammatory given that it was not present recently, but meriting observation on follow-up exams. 3. Centrilobular emphysema. 4. Upper abdominal ventral hernias, some containing transverse colon in a Richter configuration. 5. Cholelithiasis. 6. There are 2 areas of soft tissue fullness in the tail the pancreas. Both had low-grade metabolic activity on recent PET-CT, but were also present and not significantly changed from 2010. Accordingly, I believe these are probably benign Electronically Signed  By: Sherryl Barters M.D.  On: 01/17/2014 16:19  11/27/14- 39 yoM former smoker followed  for OSA and COPD, lung nodules, complicated by CAD and AFib/ pacemaker, obesity hypoventilation syndrome , CHF,cavitary RUL squamous cell ca, parotid neoplasm    FOLLOWS FOR: Wears BiPAP 11/9 [facility] every night for about 3.5-5 hours; DME is [facility] and no request for DL as of today. Wakes up during the night for restroom breaks or dry mouth issues. Sleep is fragmented by nocturia. Naps with his BIPAP as well. Using Biotene for dry mouth. Patient previously had CPAP- failed in home because he couldn't tolerate the pressure. BIPAP much better tolerated. Followed for his cancer by radiation oncology. CT chest 11/16/14 IMPRESSION: Radiation changes in the right upper lobe. Underlying 9 mm posterior right  upper upper lobe nodule is less conspicuous on the current study. Associated trace right pleural effusion. Small thoracic lymph nodes, grossly unchanged. Two stable pancreatic tail cysts, measuring up to 2.1 cm. Additional ancillary findings as above. Electronically Signed  By: Julian Hy M.D.  On: 11/16/2014 17:07  ROS-see HPI Constitutional:   No-   weight loss, night sweats, fevers, chills, fatigue, lassitude. HEENT:   No-  headaches, difficulty swallowing, tooth/dental problems, sore throat,       No-  sneezing, itching, ear ache, nasal congestion, post nasal drip,  CV:  No-   chest pain, orthopnea, PND, swelling in lower extremities, anasarca,                                                   dizziness, palpitations Resp: No-   shortness of breath with exertion or at rest.              No-   productive cough,  No non-productive cough,  No- coughing up of blood.              No-   change in color of mucus.  No- wheezing.   Skin: No-   rash or lesions. GI:  No-   heartburn, indigestion, abdominal pain, nausea, vomiting,  GU:  MS:  No-   joint pain or swelling.   Neuro-     nothing unusual Psych:  No- change in mood or affect. No depression or anxiety.  No memory loss.  Objective:   Physical Exam General- Alert, Oriented, Affect-appropriate, Distress- none acute. Obese. Skin- rash-none, lesions- none, excoriation- none Lymphadenopathy-  +mass at angle L jaw Head- atraumatic            Eyes- Gross vision intact, PERRLA, conjunctivae clear secretions            Ears- +Hearing aid            Nose- Clear, no-Septal dev, mucus, polyps, erosion, perforation             Throat- Mallampati III-IV , mucosa clear/ not very dry , drainage- none, tonsils- atrophic. Neck- flexible , trachea midline, no stridor , thyroid nl, carotid no bruit Chest - symmetrical excursion , unlabored           Heart/CV- IRR , no murmur , no gallop  , no rub, nl s1 s2                           -  JVD +1cm , edema- none, stasis changes- none, varices- none           Lung-  clear,  cough- none , dullness-none, rub- none           Chest wall- +L pacemaker Abd-  Br/ Gen/ Rectal- Not done, not indicated Extrem- cyanosis- none, clubbing, none, atrophy- none, strength- nl Neuro- grossly intact to observation

## 2014-11-27 NOTE — Patient Instructions (Addendum)
Order- [facility]- download BIPAP for pressure compliance  Dx OSA  You can ask your pharmacist if he can recommend a dry mouth product to try other than Biotene  You can ask [facility] when you might qualify for a new BiPAP machine

## 2014-11-28 ENCOUNTER — Other Ambulatory Visit: Payer: Self-pay | Admitting: Cardiology

## 2014-12-07 ENCOUNTER — Ambulatory Visit (INDEPENDENT_AMBULATORY_CARE_PROVIDER_SITE_OTHER): Payer: Medicare Other

## 2014-12-07 DIAGNOSIS — I4891 Unspecified atrial fibrillation: Secondary | ICD-10-CM

## 2014-12-07 DIAGNOSIS — Z5181 Encounter for therapeutic drug level monitoring: Secondary | ICD-10-CM | POA: Diagnosis not present

## 2014-12-07 LAB — POCT INR: INR: 2.1

## 2014-12-18 NOTE — Assessment & Plan Note (Signed)
BiPAP still causes bothersome dry mouth but he copes with it. Drinking more results in more sleep disturbance by nocturia.

## 2014-12-18 NOTE — Assessment & Plan Note (Signed)
He follows with radiation oncology. Known radiation fibrosis right upper lobe

## 2014-12-18 NOTE — Assessment & Plan Note (Signed)
His physical activity does not exceed his respiratory limit so he feels fairly stable. We discussed impact of radiation therapy changes on his lungs.

## 2014-12-27 ENCOUNTER — Ambulatory Visit (INDEPENDENT_AMBULATORY_CARE_PROVIDER_SITE_OTHER): Payer: Medicare Other | Admitting: Podiatry

## 2014-12-27 ENCOUNTER — Encounter: Payer: Self-pay | Admitting: Podiatry

## 2014-12-27 VITALS — BP 153/71 | HR 89 | Temp 97.2°F | Resp 16

## 2014-12-27 DIAGNOSIS — M79676 Pain in unspecified toe(s): Secondary | ICD-10-CM | POA: Diagnosis not present

## 2014-12-27 DIAGNOSIS — B351 Tinea unguium: Secondary | ICD-10-CM

## 2014-12-27 NOTE — Progress Notes (Signed)
Patient ID: [name] Gill, male   DOB: 1926-07-04, 81 y.o.   MRN: [id number]  Subjective: This patient presents again complaining of painful toenails and requesting toenail debridement  Objective: The toenails are elongated, brittle, discolored, hypertrophic and tender to direct palpation 6-10  Assessment: Symptomatic onychomycoses 6-10  Plan: Debridement toenails 10 without any bleeding  Reappoint 3 months

## 2015-01-02 ENCOUNTER — Other Ambulatory Visit: Payer: Self-pay | Admitting: Internal Medicine

## 2015-01-03 ENCOUNTER — Telehealth: Payer: Self-pay | Admitting: Pulmonary Disease

## 2015-01-03 ENCOUNTER — Telehealth: Payer: Self-pay | Admitting: Internal Medicine

## 2015-01-03 ENCOUNTER — Encounter: Payer: Self-pay | Admitting: Cardiology

## 2015-01-03 DIAGNOSIS — G4733 Obstructive sleep apnea (adult) (pediatric): Secondary | ICD-10-CM

## 2015-01-03 NOTE — Telephone Encounter (Signed)
ATC line busy wcb 

## 2015-01-03 NOTE — Telephone Encounter (Signed)
error 

## 2015-01-04 NOTE — Telephone Encounter (Signed)
lmtcb X1 for pt  

## 2015-01-05 NOTE — Telephone Encounter (Signed)
Pt returned call Pt does not want a return call at this time

## 2015-01-05 NOTE — Telephone Encounter (Signed)
Ok to order DME [facility] replacement BIPAP machine 11/9, mask of choice, humidifier, supplies dx OSA

## 2015-01-05 NOTE — Telephone Encounter (Signed)
Noted, at pt's request we will not contact him about this message thread unless necessary.  Dr. Annamaria Boots are you ok with this new cpap order?  Thanks!

## 2015-01-05 NOTE — Telephone Encounter (Signed)
Pt aware that order placed to [place].  Nothing further needed.

## 2015-01-05 NOTE — Telephone Encounter (Signed)
Patient says he is eligible for a new BiPAP machine and would like an order to get new machine.    Ok to place order with [facility]?  Please advise.

## 2015-01-08 ENCOUNTER — Ambulatory Visit (INDEPENDENT_AMBULATORY_CARE_PROVIDER_SITE_OTHER): Payer: Medicare Other | Admitting: *Deleted

## 2015-01-08 DIAGNOSIS — I4891 Unspecified atrial fibrillation: Secondary | ICD-10-CM

## 2015-01-08 DIAGNOSIS — Z5181 Encounter for therapeutic drug level monitoring: Secondary | ICD-10-CM | POA: Diagnosis not present

## 2015-01-08 LAB — POCT INR: INR: 1.8

## 2015-01-25 ENCOUNTER — Encounter: Payer: Self-pay | Admitting: Internal Medicine

## 2015-01-31 ENCOUNTER — Ambulatory Visit (INDEPENDENT_AMBULATORY_CARE_PROVIDER_SITE_OTHER): Payer: Medicare Other | Admitting: *Deleted

## 2015-01-31 DIAGNOSIS — I495 Sick sinus syndrome: Secondary | ICD-10-CM | POA: Diagnosis not present

## 2015-01-31 NOTE — Progress Notes (Signed)
Remote pacemaker transmission.   

## 2015-02-05 ENCOUNTER — Ambulatory Visit (INDEPENDENT_AMBULATORY_CARE_PROVIDER_SITE_OTHER): Payer: Medicare Other | Admitting: *Deleted

## 2015-02-05 DIAGNOSIS — I4891 Unspecified atrial fibrillation: Secondary | ICD-10-CM | POA: Diagnosis not present

## 2015-02-05 DIAGNOSIS — Z5181 Encounter for therapeutic drug level monitoring: Secondary | ICD-10-CM | POA: Diagnosis not present

## 2015-02-05 LAB — POCT INR: INR: 3.4

## 2015-02-06 LAB — CUP PACEART REMOTE DEVICE CHECK
Battery Impedance: 187 Ohm
Battery Remaining Longevity: 118 mo
Battery Voltage: 2.78 V
Date Time Interrogation Session: 20160713120724
Lead Channel Pacing Threshold Amplitude: 0.5 V
Lead Channel Sensing Intrinsic Amplitude: 16 mV
Lead Channel Setting Pacing Amplitude: 2 V
Lead Channel Setting Pacing Pulse Width: 0.4 ms
Lead Channel Setting Sensing Sensitivity: 4 mV
MDC IDC MSMT LEADCHNL RA IMPEDANCE VALUE: 0 Ohm
MDC IDC MSMT LEADCHNL RV IMPEDANCE VALUE: 527 Ohm
MDC IDC MSMT LEADCHNL RV PACING THRESHOLD PULSEWIDTH: 0.4 ms
MDC IDC STAT BRADY RV PERCENT PACED: 32 %

## 2015-02-14 ENCOUNTER — Other Ambulatory Visit: Payer: Self-pay | Admitting: Internal Medicine

## 2015-02-23 ENCOUNTER — Encounter: Payer: Self-pay | Admitting: Cardiology

## 2015-02-26 ENCOUNTER — Ambulatory Visit (INDEPENDENT_AMBULATORY_CARE_PROVIDER_SITE_OTHER): Payer: Medicare Other | Admitting: *Deleted

## 2015-02-26 DIAGNOSIS — I4891 Unspecified atrial fibrillation: Secondary | ICD-10-CM

## 2015-02-26 DIAGNOSIS — Z5181 Encounter for therapeutic drug level monitoring: Secondary | ICD-10-CM | POA: Diagnosis not present

## 2015-02-26 LAB — POCT INR: INR: 2.3

## 2015-02-27 ENCOUNTER — Encounter: Payer: Self-pay | Admitting: Internal Medicine

## 2015-03-06 ENCOUNTER — Telehealth: Payer: Self-pay | Admitting: Internal Medicine

## 2015-03-06 NOTE — Addendum Note (Signed)
Addended by: Baird Lyons D on: 03/06/2015 05:01 PM   Modules accepted: Miquel Dunn

## 2015-03-06 NOTE — Telephone Encounter (Signed)
Note addended as requested

## 2015-03-06 NOTE — Telephone Encounter (Signed)
Staff message sent to [facility] to let her know this has been taken care of. Nothing more needed at this time.

## 2015-03-06 NOTE — Telephone Encounter (Signed)
[name] Gill states she needs CY to put on 11-27-2014 OV note: Pt was previously on CPAP; pt did not tolerate in home due to CPAP pressure; pt is currently on BiPAP and doing well.

## 2015-03-13 ENCOUNTER — Encounter: Payer: Self-pay | Admitting: Cardiology

## 2015-03-13 ENCOUNTER — Other Ambulatory Visit: Payer: Self-pay | Admitting: Cardiology

## 2015-03-29 ENCOUNTER — Other Ambulatory Visit: Payer: Self-pay | Admitting: General Surgery

## 2015-03-29 ENCOUNTER — Ambulatory Visit (INDEPENDENT_AMBULATORY_CARE_PROVIDER_SITE_OTHER): Payer: Medicare Other

## 2015-03-29 DIAGNOSIS — I4891 Unspecified atrial fibrillation: Secondary | ICD-10-CM

## 2015-03-29 DIAGNOSIS — Z5181 Encounter for therapeutic drug level monitoring: Secondary | ICD-10-CM

## 2015-03-29 LAB — POCT INR: INR: 2

## 2015-03-30 ENCOUNTER — Other Ambulatory Visit: Payer: Self-pay | Admitting: General Surgery

## 2015-03-30 DIAGNOSIS — D179 Benign lipomatous neoplasm, unspecified: Secondary | ICD-10-CM

## 2015-03-30 DIAGNOSIS — R2241 Localized swelling, mass and lump, right lower limb: Secondary | ICD-10-CM

## 2015-04-03 ENCOUNTER — Telehealth: Payer: Self-pay | Admitting: Cardiology

## 2015-04-03 NOTE — Telephone Encounter (Signed)
Confirmed with patient he has an OV with Dr. Radford Pax on Thursday, 9/15 at 1630.  Patient agrees with treatment plan.

## 2015-04-03 NOTE — Telephone Encounter (Signed)
New Message  General Message  Pt states that he was advised that his appointment was on 04/04/2015 at 4:30p but the appointment is scheduled on 9/15 @ 4:30. Pt wishes to consult with a nurse and ask if the appt is really on the 9/15 and not 9/14. We have informed the pt that the schedule was not changed. He is anxious to speak to Dr. Theodosia Blender nurse about this matter. Please assist

## 2015-04-04 ENCOUNTER — Ambulatory Visit
Admission: RE | Admit: 2015-04-04 | Discharge: 2015-04-04 | Disposition: A | Discharge status: Home / Self Care | Payer: Medicare Other | Source: Ambulatory Visit | Attending: General Surgery | Admitting: General Surgery

## 2015-04-04 ENCOUNTER — Ambulatory Visit: Payer: Medicare Other | Admitting: Podiatry

## 2015-04-04 NOTE — Progress Notes (Signed)
Cardiology Office Note   Date:  04/05/2015   ID:  [name], DOB 02-May-1926, MRN [id number]  PCP:  Mathews Argyle, MD    No chief complaint on file.     History of Present Illness: Gabino Hagin is a 81 y.o. male with a history of ASCAD, s/p CABG, dyslipidemia, HTN, chronic atrial fibrillation, chronic systemic anticoagulation and tachybrady syndrome s/p PPM who presents for followup. He had a scan done of his neck and chest for a parotid gland mass and the CT showed a spot on his lung and  had a PET scan done and then lung biopsy by Dr. Servando Snare and was diagnosed with Stage I lung cancer and had a resection. He underwent XRT and is doing well. He denies any chest pain, SOB, DOE, palpitations, dizziness or syncope. He occasionally has some LE edema.     Past Medical History  Diagnosis Date  . Atrial fibrillation     coumadin, pacemaker/ CHRONIC  . Diverticulitis 1975  . Hypertension   . COPD (chronic obstructive pulmonary disease)     FEV1 1.35/58% 01-11-10  . Pacemaker -Medtronic     Medtronic  . Ventricular tachycardia, non-sustained     Detected on the device  . Hyperlipidemia     goal LDL less than 70  . PVD (peripheral vascular disease)     w mod left external carotid artery stenosis.   . Obesity   . Ichthyosis   . Renal insufficiency     stage III   . Hyperglycemia     mild glucose 111 on 11/2010  . H/O echocardiogram     nl LVF w severe ASSH and mod cLVH, mildly dilated aorta, milf LAE, mild MR/TR and moderate AVSC  . Incisional hernia     2 upper and 1 lower abdominal incisional hernia.   . Low testosterone     start androgel  . Tachycardia-bradycardia syndrome     s/p PPM  . Chronic anticoagulation   . CHD (coronary heart disease)     s/p CABG with patents grafts at cath 2009  . Aortic root dilatation   . CHF (congestive heart failure)   . Obesity   . Cataract      no surgery  . GERD (gastroesophageal reflux disease)   .  Dysrhythmia     afib  . OSA (obstructive sleep apnea)     04-04-08 NPSG AHI 14.5, 3L/M suppl O2 for nadir 86%  . H/O hiatal hernia   . Arthritis   . Anemia     hx  . Malignant neoplasm of lower lobe, bronchus, or lung 11/22/2013  . Radiation 5/5,57/,5/11,5/13,5/152015    right upper lobe 60 Gy in 5 fractions    Past Surgical History  Procedure Laterality Date  . Broken nose    . Pacemaker insertion  02/2008    medtronic  . Coronary artery bypass graft  5093,2671    coronary disease s/p CABG 2004 and Cath 2009  . Cardiac catheterization  2009  . Pacemaker insertion  11/14  . Colon surgery  85    diverticulitis  . Back surgery      herniated disc  . Hernia repair    . Parotidectomy Left 12/15/2013    WITH NECK DISECTION  . Parotidectomy Left 12/15/2013    Procedure: SUPEFICIAL PAROTIDECTOMY WITH FACIAL NERVE DISSECTION;  Surgeon: [name], MD;  Location: [facility];  Service: ENT;  Laterality: Left;  . Permanent pacemaker generator change N/A 06/01/2013    Procedure: PERMANENT PACEMAKER GENERATOR CHANGE;  Surgeon: [name], MD;  Location: [facility] C[facility];  Service: Cardiovascular;  Laterality: N/A;     Current Outpatient Prescriptions  Medication Sig Dispense Refill  . Acai 500 MG CAPS Take 500 mg by mouth 2 (two) times daily.    . Ascorbic Acid (VITAMIN C) 500 MG tablet Take 500 mg by mouth daily.      [name] atorvastatin (LIPITOR) 80 MG tablet Take 80 mg by mouth daily.     . beta carotene w/minerals (OCUVITE) tablet Take 2 tablets by mouth daily.     . Bisacodyl (DULCOLAX PO) Take 1 tablet by mouth daily.    [name] CARTIA XT 180 MG 24 hr capsule TAKE 1 CAPSULE BY MOUTH DAILY 30 capsule 1  . Cholecalciferol (VITAMIN D PO) Take 1 tablet by mouth daily.     [name] co-enzyme Q-10 30 MG capsule Take 2 capsules by mouth 2 (two) times daily.     . fenofibrate 54 MG tablet Take 54 mg by mouth Daily.     [name] FOLIC ACID PO Take 1 tablet by mouth daily.     . furosemide (LASIX) 80 MG tablet  Take one tablet by mouth every morning and 1/2 of a tablet by mouth every afternoon. 135 tablet 3  . GRAPE SEED EXTRACT PO Take 1 capsule by mouth 2 (two) times daily.    . Lycopene 10 MG CAPS Take 10 mg by mouth daily.    . Misc Natural Products (BLACK CHERRY CONCENTRATE PO) Take 2 capsules by mouth daily.    . Multiple Vitamin (MULTIVITAMIN PO) Take 1 tablet by mouth daily.     [name] NITROSTAT 0.4 MG SL tablet Place 0.4 mg under the tongue every 5 (five) minutes as needed for chest pain. Take as directed    . Omega-3 Fatty Acids (FISH OIL) 1000 MG CAPS Take 3,000 mg by mouth daily.     [name] OVER THE COUNTER MEDICATION Take 4-5 oz by mouth every evening. "Diet tonic water"- quinine    . pantoprazole (PROTONIX) 40 MG tablet Take 40 mg by mouth daily.     . potassium chloride (KLOR-CON 10) 10 MEQ CR tablet Take 10 mEq by mouth daily.      [name] RESVERATROL PO Take 1 capsule by mouth 2 (two) times daily.    . saw palmetto 160 MG capsule Take 800 mg by mouth daily. Takes 5 capsules daily    . SELENIUM PO Take 1 tablet by mouth daily.     [name] SPIRIVA HANDIHALER 18 MCG inhalation capsule INHALE CONTENTS OF 1 CAPSULE BY MOUTH DAILY AS DIRECTED 90 capsule 0  . SYMBICORT 160-4.5 MCG/ACT inhaler INHALE 2 PUFFS INTO THE LUNGS 2 TIMES DAILY 10.2 g 5  . TURMERIC PO Take 2 capsules by mouth daily.     [name] warfarin (COUMADIN) 2.5 MG tablet TAKE AS DIRECTED BY THE COUMADIN CLINIC *THIS IS A 30 DAYS SUPPLY* 60 tablet 3   No current facility-administered medications for this visit.    Allergies:   Review of patient's allergies indicates no known allergies.    Social History:  The patient  reports that he quit smoking about 17 years ago. His smoking use included Cigarettes. He has a 110 pack-year smoking history. He has never used smokeless tobacco. He reports that he drinks alcohol. He reports that he does not use illicit drugs.  Family History:  The patient's family history includes Aortic stenosis in his mother; Cancer  in his father; Tuberculosis in his father.    ROS:  Please see the history of present illness.   Otherwise, review of systems are positive for none.   All other systems are reviewed and negative.    PHYSICAL EXAM: VS:  BP 130/74 mmHg  Pulse 84  Ht '5\' 6"'$  (1.676 m)  Wt 204 lb 6.4 oz (92.715 kg)  BMI 33.01 kg/m2  SpO2 98% , BMI Body mass index is 33.01 kg/(m^2). GEN: Well nourished, well developed, in no acute distress HEENT: normal Neck: no JVD, carotid bruits, or masses Cardiac: RRR; no murmurs, rubs, or gallops.  Trace to 1+ edema of LE Respiratory:  clear to auscultation bilaterally, normal work of breathing GI: soft, nontender, nondistended, + BS MS: no deformity or atrophy Skin: warm and dry, no rash Neuro:  Strength and sensation are intact Psych: euthymic mood, full affect   EKG:  EKG is not ordered today.    Recent Labs: 10/05/2014: ALT 15; BUN 29*; Creatinine, Ser 1.37; Potassium 4.1; Sodium 141    Lipid Panel    Component Value Date/Time   CHOL 97 10/05/2014 0810   TRIG 76.0 10/05/2014 0810   HDL 31.50* 10/05/2014 0810   CHOLHDL 3 10/05/2014 0810   VLDL 15.2 10/05/2014 0810   LDLCALC 50 10/05/2014 0810      Wt Readings from Last 3 Encounters:  04/05/15 204 lb 6.4 oz (92.715 kg)  11/27/14 215 lb 12.8 oz (97.886 kg)  11/17/14 216 lb 3.2 oz (98.068 kg)    ASSESSMENT AND PLAN:  1. ASCAD with no angina - not on ASA due to warfarin 2. Chronic atrial fibrillation - rate controlled  - continue warfarin/Cartia 3. Dyslipidemia - LDL at goal 03/2014 - continue atorvastatin/fenofibrate - check FLP and ALT 4. Chronic systemic anticoagulation 5. HTN well controlled  - continue Diltiazem  6. Tachybrady syndrome s/p PPM 7. Mildly dilated aortic root - normal size on last echo 8. Chronic LE edema  - continue Lasix   - check BMET    Current medicines are reviewed at length with the patient today.  The patient does not have concerns regarding  medicines.  The following changes have been made:  no change  Labs/ tests ordered today: See above Assessment and Plan No orders of the defined types were placed in this encounter.     Disposition:   FU with me in 6 months  Signed, Sueanne Margarita, MD  04/05/2015 4:10 PM    [facility] [address], [place], [place]  [address] Phone: [phone]; Fax: [phone]

## 2015-04-05 ENCOUNTER — Ambulatory Visit (INDEPENDENT_AMBULATORY_CARE_PROVIDER_SITE_OTHER): Payer: Medicare Other | Admitting: Cardiology

## 2015-04-05 ENCOUNTER — Telehealth: Payer: Self-pay | Admitting: Cardiology

## 2015-04-05 ENCOUNTER — Other Ambulatory Visit: Payer: Self-pay | Admitting: Cardiology

## 2015-04-05 ENCOUNTER — Encounter: Payer: Self-pay | Admitting: Cardiology

## 2015-04-05 DIAGNOSIS — I482 Chronic atrial fibrillation, unspecified: Secondary | ICD-10-CM

## 2015-04-05 DIAGNOSIS — I2510 Atherosclerotic heart disease of native coronary artery without angina pectoris: Secondary | ICD-10-CM | POA: Diagnosis not present

## 2015-04-05 DIAGNOSIS — E785 Hyperlipidemia, unspecified: Secondary | ICD-10-CM

## 2015-04-05 DIAGNOSIS — I2583 Coronary atherosclerosis due to lipid rich plaque: Secondary | ICD-10-CM

## 2015-04-05 DIAGNOSIS — I495 Sick sinus syndrome: Secondary | ICD-10-CM | POA: Diagnosis not present

## 2015-04-05 NOTE — Patient Instructions (Signed)
Medication Instructions:  Your physician recommends that you continue on your current medications as directed. Please refer to the Current Medication list given to you today.   Labwork: Your physician recommends that you return for FASTING lab work. (BMET, LFTs, Lipids)   Testing/Procedures: None  Follow-Up: Your physician wants you to follow-up in: 6 months with Dr. Radford Pax. You will receive a reminder letter in the mail two months in advance. If you don't receive a letter, please call our office to schedule the follow-up appointment.   Any Other Special Instructions Will Be Listed Below (If Applicable).

## 2015-04-05 NOTE — Telephone Encounter (Signed)
error 

## 2015-04-09 ENCOUNTER — Other Ambulatory Visit (INDEPENDENT_AMBULATORY_CARE_PROVIDER_SITE_OTHER): Payer: Medicare Other | Admitting: *Deleted

## 2015-04-09 ENCOUNTER — Other Ambulatory Visit: Payer: Self-pay | Admitting: General Surgery

## 2015-04-09 DIAGNOSIS — M7989 Other specified soft tissue disorders: Secondary | ICD-10-CM

## 2015-04-09 DIAGNOSIS — E785 Hyperlipidemia, unspecified: Secondary | ICD-10-CM | POA: Diagnosis not present

## 2015-04-09 DIAGNOSIS — I482 Chronic atrial fibrillation, unspecified: Secondary | ICD-10-CM

## 2015-04-09 DIAGNOSIS — R229 Localized swelling, mass and lump, unspecified: Secondary | ICD-10-CM

## 2015-04-09 DIAGNOSIS — IMO0002 Reserved for concepts with insufficient information to code with codable children: Secondary | ICD-10-CM

## 2015-04-09 LAB — LIPID PANEL
CHOL/HDL RATIO: 4
Cholesterol: 103 mg/dL (ref 0–200)
HDL: 25.8 mg/dL — AB (ref >39.00)
LDL CALC: 56 mg/dL (ref 0–99)
NONHDL: 76.77
Triglycerides: 103 mg/dL (ref 0.0–149.0)
VLDL: 20.6 mg/dL (ref 0.0–40.0)

## 2015-04-09 LAB — BASIC METABOLIC PANEL
BUN: 23 mg/dL (ref 6–23)
CALCIUM: 9.1 mg/dL (ref 8.4–10.5)
CO2: 31 mEq/L (ref 19–32)
Chloride: 102 mEq/L (ref 96–112)
Creatinine, Ser: 1.32 mg/dL (ref 0.40–1.50)
GFR: 54.51 mL/min — AB (ref >60.00)
GLUCOSE: 78 mg/dL (ref 70–99)
POTASSIUM: 3.8 meq/L (ref 3.5–5.1)
SODIUM: 141 meq/L (ref 135–145)

## 2015-04-09 LAB — HEPATIC FUNCTION PANEL
ALK PHOS: 36 U/L — AB (ref 39–117)
ALT: 16 U/L (ref 0–53)
AST: 25 U/L (ref 0–37)
Albumin: 3.7 g/dL (ref 3.5–5.2)
BILIRUBIN DIRECT: 0.2 mg/dL (ref 0.0–0.3)
BILIRUBIN TOTAL: 0.8 mg/dL (ref 0.2–1.2)
Total Protein: 7 g/dL (ref 6.0–8.3)

## 2015-04-12 ENCOUNTER — Other Ambulatory Visit: Payer: Self-pay | Admitting: Internal Medicine

## 2015-04-18 ENCOUNTER — Other Ambulatory Visit: Payer: Self-pay | Admitting: Cardiology

## 2015-04-18 NOTE — Telephone Encounter (Signed)
Refill for Warfarin

## 2015-04-20 ENCOUNTER — Other Ambulatory Visit: Payer: Self-pay | Admitting: Cardiology

## 2015-04-25 ENCOUNTER — Ambulatory Visit (INDEPENDENT_AMBULATORY_CARE_PROVIDER_SITE_OTHER): Payer: Medicare Other | Admitting: Pharmacist

## 2015-04-25 ENCOUNTER — Encounter: Payer: Self-pay | Admitting: Podiatry

## 2015-04-25 ENCOUNTER — Ambulatory Visit (INDEPENDENT_AMBULATORY_CARE_PROVIDER_SITE_OTHER): Payer: Medicare Other | Admitting: Podiatry

## 2015-04-25 DIAGNOSIS — B351 Tinea unguium: Secondary | ICD-10-CM | POA: Diagnosis not present

## 2015-04-25 DIAGNOSIS — M79676 Pain in unspecified toe(s): Secondary | ICD-10-CM

## 2015-04-25 DIAGNOSIS — I482 Chronic atrial fibrillation, unspecified: Secondary | ICD-10-CM

## 2015-04-25 DIAGNOSIS — I4891 Unspecified atrial fibrillation: Secondary | ICD-10-CM | POA: Diagnosis not present

## 2015-04-25 DIAGNOSIS — Z5181 Encounter for therapeutic drug level monitoring: Secondary | ICD-10-CM | POA: Diagnosis not present

## 2015-04-25 LAB — POCT INR: INR: 2.5

## 2015-04-25 NOTE — Patient Instructions (Signed)
Apply all-purpose skin lotion to your feet twice a day

## 2015-04-26 NOTE — Progress Notes (Signed)
Patient ID: [name] Gill, male   DOB: [birth year], 81 y.o.   MRN: [id number]  Subjective: This patient presents for scheduled visit again complaining of painful toenails and walking wearing shoes request nail debridement  Objective: Dry scaling skin dorsally plantarly bilaterally The toenails are hypertrophic, elongated, brittle, discolored and tender to direct palpation 6-10  Assessment: Symptomatic onychomycoses 6-10 Dry scaling skin bilaterally  Plan: Debridement toenails 10 mechanically and electrically without any bleeding Patient advised to apply all-purpose skin lotion 1-2 times daily  Reappoint 3 month

## 2015-05-03 ENCOUNTER — Ambulatory Visit (INDEPENDENT_AMBULATORY_CARE_PROVIDER_SITE_OTHER): Payer: Medicare Other | Admitting: *Deleted

## 2015-05-03 DIAGNOSIS — I495 Sick sinus syndrome: Secondary | ICD-10-CM

## 2015-05-03 NOTE — Progress Notes (Signed)
Remote pacemaker transmission.   

## 2015-05-04 LAB — CUP PACEART REMOTE DEVICE CHECK
Battery Impedance: 236 Ohm
Battery Remaining Longevity: 112 mo
Battery Voltage: 2.79 V
Date Time Interrogation Session: 20161013103809
Implantable Lead Implant Date: 20090820
Implantable Lead Location: 753860
Implantable Lead Model: 4092
Lead Channel Pacing Threshold Pulse Width: 0.4 ms
Lead Channel Setting Pacing Pulse Width: 0.4 ms
Lead Channel Setting Sensing Sensitivity: 4 mV
MDC IDC MSMT LEADCHNL RA IMPEDANCE VALUE: 0 Ohm
MDC IDC MSMT LEADCHNL RV IMPEDANCE VALUE: 548 Ohm
MDC IDC MSMT LEADCHNL RV PACING THRESHOLD AMPLITUDE: 0.625 V
MDC IDC MSMT LEADCHNL RV SENSING INTR AMPL: 8 mV
MDC IDC SET LEADCHNL RV PACING AMPLITUDE: 2 V
MDC IDC STAT BRADY RV PERCENT PACED: 32 %

## 2015-05-08 ENCOUNTER — Encounter: Payer: Self-pay | Admitting: Cardiology

## 2015-05-16 ENCOUNTER — Ambulatory Visit
Admission: RE | Admit: 2015-05-16 | Discharge: 2015-05-16 | Disposition: A | Discharge status: Home / Self Care | Payer: Medicare Other | Source: Ambulatory Visit | Attending: General Surgery | Admitting: General Surgery

## 2015-05-21 ENCOUNTER — Ambulatory Visit
Admission: RE | Admit: 2015-05-21 | Discharge: 2015-05-21 | Disposition: A | Discharge status: Home / Self Care | Payer: Medicare Other | Source: Ambulatory Visit | Attending: Radiation Oncology | Admitting: Radiation Oncology

## 2015-05-21 ENCOUNTER — Ambulatory Visit (HOSPITAL_COMMUNITY)
Admission: RE | Admit: 2015-05-21 | Discharge: 2015-05-21 | Disposition: A | Discharge status: Home / Self Care | Payer: Medicare Other | Source: Ambulatory Visit | Attending: Radiation Oncology | Admitting: Radiation Oncology

## 2015-05-21 ENCOUNTER — Other Ambulatory Visit: Payer: Self-pay | Admitting: Radiation Oncology

## 2015-05-21 ENCOUNTER — Encounter (HOSPITAL_COMMUNITY): Payer: Self-pay

## 2015-05-21 DIAGNOSIS — K862 Cyst of pancreas: Secondary | ICD-10-CM | POA: Diagnosis not present

## 2015-05-21 DIAGNOSIS — C3431 Malignant neoplasm of lower lobe, right bronchus or lung: Secondary | ICD-10-CM

## 2015-05-21 DIAGNOSIS — R590 Localized enlarged lymph nodes: Secondary | ICD-10-CM | POA: Insufficient documentation

## 2015-05-21 DIAGNOSIS — C3411 Malignant neoplasm of upper lobe, right bronchus or lung: Secondary | ICD-10-CM | POA: Insufficient documentation

## 2015-05-21 DIAGNOSIS — Z08 Encounter for follow-up examination after completed treatment for malignant neoplasm: Secondary | ICD-10-CM | POA: Diagnosis not present

## 2015-05-21 LAB — BUN AND CREATININE (CC13)
BUN: 22.9 mg/dL (ref 7.0–26.0)
CREATININE: 1.4 mg/dL — AB (ref 0.7–1.3)
EGFR: 45 mL/min/{1.73_m2} — ABNORMAL LOW (ref >90)

## 2015-05-23 ENCOUNTER — Other Ambulatory Visit: Payer: Self-pay | Admitting: General Surgery

## 2015-05-23 ENCOUNTER — Encounter: Payer: Self-pay | Admitting: Cardiology

## 2015-05-23 DIAGNOSIS — D171 Benign lipomatous neoplasm of skin and subcutaneous tissue of trunk: Secondary | ICD-10-CM

## 2015-05-25 ENCOUNTER — Ambulatory Visit: Payer: Medicare Other | Admitting: Radiation Oncology

## 2015-05-30 ENCOUNTER — Ambulatory Visit: Payer: Medicare Other | Admitting: Internal Medicine

## 2015-05-31 ENCOUNTER — Encounter: Payer: Self-pay | Admitting: Radiation Oncology

## 2015-05-31 ENCOUNTER — Ambulatory Visit
Admission: RE | Admit: 2015-05-31 | Discharge: 2015-05-31 | Disposition: A | Discharge status: Home / Self Care | Payer: Medicare Other | Source: Ambulatory Visit | Attending: Radiation Oncology | Admitting: Radiation Oncology

## 2015-05-31 ENCOUNTER — Encounter: Payer: Self-pay | Admitting: *Deleted

## 2015-05-31 VITALS — BP 143/69 | HR 87 | Temp 98.3°F | Resp 16 | Wt 205.0 lb

## 2015-05-31 DIAGNOSIS — R300 Dysuria: Secondary | ICD-10-CM | POA: Diagnosis present

## 2015-05-31 DIAGNOSIS — C3431 Malignant neoplasm of lower lobe, right bronchus or lung: Secondary | ICD-10-CM | POA: Diagnosis not present

## 2015-05-31 LAB — CBC WITH DIFFERENTIAL/PLATELET
Basophils Absolute: 0 10*3/uL (ref 0.0–0.1)
Basophils Relative: 1 %
EOS ABS: 0.1 10*3/uL (ref 0.0–0.7)
EOS PCT: 1 %
HCT: 34.4 % — ABNORMAL LOW (ref 39.0–52.0)
Hemoglobin: 11 g/dL — ABNORMAL LOW (ref 13.0–17.0)
LYMPHS ABS: 1.2 10*3/uL (ref 0.7–4.0)
LYMPHS PCT: 14 %
MCH: 28.9 pg (ref 26.0–34.0)
MCHC: 32 g/dL (ref 30.0–36.0)
MCV: 90.3 fL (ref 78.0–100.0)
MONO ABS: 1.1 10*3/uL — AB (ref 0.1–1.0)
Monocytes Relative: 13 %
Neutro Abs: 6.3 10*3/uL (ref 1.7–7.7)
Neutrophils Relative %: 71 %
PLATELETS: 231 10*3/uL (ref 150–400)
RBC: 3.81 MIL/uL — ABNORMAL LOW (ref 4.22–5.81)
RDW: 17.5 % — AB (ref 11.5–15.5)
WBC: 8.7 10*3/uL (ref 4.0–10.5)

## 2015-05-31 LAB — URINALYSIS, ROUTINE W REFLEX MICROSCOPIC
Bilirubin Urine: NEGATIVE
Glucose, UA: NEGATIVE mg/dL
HGB URINE DIPSTICK: NEGATIVE
Ketones, ur: NEGATIVE mg/dL
LEUKOCYTES UA: NEGATIVE
NITRITE: NEGATIVE
PROTEIN: NEGATIVE mg/dL
Specific Gravity, Urine: 1.015 (ref 1.005–1.030)
UROBILINOGEN UA: 1 mg/dL (ref 0.0–1.0)
pH: 6.5 (ref 5.0–8.0)

## 2015-05-31 LAB — COMPREHENSIVE METABOLIC PANEL
ALT: 17 U/L (ref 17–63)
ANION GAP: 7 (ref 5–15)
AST: 24 U/L (ref 15–41)
Albumin: 3.4 g/dL — ABNORMAL LOW (ref 3.5–5.0)
Alkaline Phosphatase: 33 U/L — ABNORMAL LOW (ref 38–126)
BUN: 23 mg/dL — ABNORMAL HIGH (ref 6–20)
CHLORIDE: 107 mmol/L (ref 101–111)
CO2: 28 mmol/L (ref 22–32)
Calcium: 8.8 mg/dL — ABNORMAL LOW (ref 8.9–10.3)
Creatinine, Ser: 1.2 mg/dL (ref 0.61–1.24)
GFR, EST NON AFRICAN AMERICAN: 53 mL/min — AB (ref >60)
Glucose, Bld: 89 mg/dL (ref 65–99)
POTASSIUM: 3.7 mmol/L (ref 3.5–5.1)
SODIUM: 142 mmol/L (ref 135–145)
Total Bilirubin: 1 mg/dL (ref 0.3–1.2)
Total Protein: 6.5 g/dL (ref 6.5–8.1)

## 2015-05-31 NOTE — Progress Notes (Signed)
PAIN: He is currently in no pain.  RESPIRATORY: Denies coughing, wheezing or SOB. Pt is on room air. SWALLOWING/DIET: Pt denies dysphagia. OTHER: Pt complains of "lipoma" to left buttock which is reported to be larger than a 1/2 dollar. Pt fell yesterday and was unable to get up.  Medics were called to assist, he refused to go to the hospital, denies injury.  Daughter reported confusion, Alert to person, difficulty with time- was able to state correct month, struggled with date, able to name day of week, initially state 1916, corrected to 2016.  Difficulty naming place, but was able to state he was seeing Dr. Thea Silversmith. Denies urinary changes, incontinence, or burning.  BP 143/69 mmHg  Pulse 87  Temp(Src) 98.3 F (36.8 C) (Oral)  Resp 16  Wt 205 lb (92.987 kg)  SpO2 100% Wt Readings from Last 3 Encounters:  05/31/15 205 lb (92.987 kg)  04/05/15 204 lb 6.4 oz (92.715 kg)  11/27/14 215 lb 12.8 oz (97.886 kg)

## 2015-05-31 NOTE — Progress Notes (Signed)
I received a called from [name] Gill (his daughter) today.  She tearfully wanted to update [place] on the recent changes with her father. She said her [name] Gill developed a "lipoma" to his hip/buttock, she is unsure what side.  What she is most distressed about is over the past few weeks she has noticed her father having "periods of confusion," "not being himself," and "searching for words." His wife has started hiding the car keys. He is angry about that. In addition, he is preparing meals and medications.  [name] Gill discovered him serving obviously spoiled lunch meat.  She reports he has been having chills and fell yesterday.  They had to call for medics to assist getting him up off the floor. He refused to go to the hospital to be assessed.  I asked about changes in urinary symptoms.  [name] Gill was unsure. Notified Dr. Pablo Ledger.

## 2015-05-31 NOTE — Progress Notes (Signed)
Department of Radiation Oncology  Phone:  [phone] Fax:        [phone]   Name: [name] Gill MRN: [id number]  DOB: Jun 24, 1926  Date: 05/31/2015  Follow Up Visit Note  Diagnosis: Stage I NSCLC of the right upper lobe  Summary and Interval since last radiation: 60 Gy in 5 fractions completed 12/15/13  Interval History: [name] Gill presents today for routine followup. Recently had an operation on his eye because of a growth under his eye. He is forgetting things easily.  He fell and tried to get up on a chair after but couldn't get up on the chair with the help from his grandson. His grandson called 68 to help him get up off the floor and he refused to go to the hospital.  He does not remember why he fell, he mostly remembers what happened after. He seems distracted and slow thinking per his wife and has cursed at her a few times which is unusual. Daughter claims he has felt chills twice. Denies problems going to the bathroom and urinary incontinence. His wife states he is drinking more water and to not take his water pill. He has a lump on the left side of his backside. His surgeon did not want to do surgery on it. He thinks it is growing but his doctor wanted to have a follow up about it. His wife claims over the past two or three weeks that he is not being himself and has to help him get dressed.  From a lung standpoint his most recent CT shows an excellent response with no signs of recurrence. There was mention of growth of the pancreatic cyst but we have scanned this in the past and it has been thought to be benign. His breathing is good and he has no increase in shortness of breath, no hemoptysis.   Allergies: No Known Allergies  Medications:  Current Outpatient Prescriptions  Medication Sig Dispense Refill  . Acai 500 MG CAPS Take 500 mg by mouth 2 (two) times daily.    . Ascorbic Acid (VITAMIN C) 500 MG tablet Take 500 mg by mouth daily.      [name] atorvastatin (LIPITOR) 80 MG  tablet Take 80 mg by mouth daily.     . beta carotene w/minerals (OCUVITE) tablet Take 2 tablets by mouth daily.     . Bisacodyl (DULCOLAX PO) Take 1 tablet by mouth daily.    [name] CARTIA XT 180 MG 24 hr capsule TAKE 1 CAPSULE BY MOUTH DAILY 30 capsule 1  . Cholecalciferol (VITAMIN D PO) Take 1 tablet by mouth daily.     [name] co-enzyme Q-10 30 MG capsule Take 2 capsules by mouth 2 (two) times daily.     . fenofibrate 54 MG tablet Take 54 mg by mouth Daily.     [name] FOLIC ACID PO Take 1 tablet by mouth daily.     . furosemide (LASIX) 80 MG tablet Take one tablet by mouth every morning and 1/2 of a tablet by mouth every afternoon. 135 tablet 3  . GRAPE SEED EXTRACT PO Take 1 capsule by mouth 2 (two) times daily.    . Lycopene 10 MG CAPS Take 10 mg by mouth daily.    . Misc Natural Products (BLACK CHERRY CONCENTRATE PO) Take 2 capsules by mouth daily.    . Multiple Vitamin (MULTIVITAMIN PO) Take 1 tablet by mouth daily.     [name] NITROSTAT 0.4 MG SL tablet Place 0.4 mg under the tongue every 5 (  five) minutes as needed for chest pain. Take as directed    . Omega-3 Fatty Acids (FISH OIL) 1000 MG CAPS Take 3,000 mg by mouth daily.     [name] OVER THE COUNTER MEDICATION Take 4-5 oz by mouth every evening. "Diet tonic water"- quinine    . pantoprazole (PROTONIX) 40 MG tablet Take 40 mg by mouth daily.     . potassium chloride (KLOR-CON 10) 10 MEQ CR tablet Take 10 mEq by mouth daily.      [name] RESVERATROL PO Take 1 capsule by mouth 2 (two) times daily.    . saw palmetto 160 MG capsule Take 800 mg by mouth daily. Takes 5 capsules daily    . SELENIUM PO Take 1 tablet by mouth daily.     [name] SPIRIVA HANDIHALER 18 MCG inhalation capsule INHALE CONTENTS OF 1 CAPSULE BY MOUTH DAILY AS DIRECTED 90 capsule 0  . SYMBICORT 160-4.5 MCG/ACT inhaler INHALE 2 PUFFS INTO THE LUNGS 2 TIMES DAILY 10.2 g 5  . TURMERIC PO Take 2 capsules by mouth daily.     [name] warfarin (COUMADIN) 2.5 MG tablet TAKE AS DIRECTED BY THE COUMADIN CLINIC *THIS IS  A 30 DAYS SUPPLY* 60 tablet 3  . warfarin (COUMADIN) 2.5 MG tablet TAKE AS DIRECTED BY THE COUMADIN CLINIC *THIS IS A 30 DAYS SUPPLY* 60 tablet 3   No current facility-administered medications for this encounter.    Physical Exam:  Filed Vitals:   05/31/15 1705  BP: 143/69  Pulse: 87  Temp: 98.3 F (36.8 C)  TempSrc: Oral  Resp: 16  Weight: 205 lb (92.987 kg)  SpO2: 100%  Appears disheveled and unshaven.  Slow cognition and difficulty with word finding.   IMPRESSION: [name] Gill is a 81 y.o. male a/p SBRT, currently NED with new mental status changes  PLAN:  He is here after 5 so we don't have a lot of resources available. He does not want to come into the hospital. I will order a CMP, CBC and UA for tonight and see if we can get him an appointment with his PCP, Dr. Felipa Eth or someone in their office tomorrow morning.   I will see him back in 1 month to discuss following up on the pancreatic cyst.    Thea Silversmith, MD    This document serves as a record of services personally performed by Thea Silversmith, MD. It was created on her behalf by  Lendon Collar, a trained medical scribe. The creation of this record is based on the scribe's personal observations and the provider's statements to them. This document has been checked and approved by the attending provider.

## 2015-06-01 ENCOUNTER — Telehealth: Payer: Self-pay | Admitting: *Deleted

## 2015-06-01 ENCOUNTER — Telehealth: Payer: Self-pay

## 2015-06-01 LAB — URINE CULTURE: Culture: NO GROWTH

## 2015-06-01 NOTE — Telephone Encounter (Signed)
I called and left a voice mail with Mr. Couey's home number and his daughter [name]) cell number. I told them the lab results from last night were normal, and also is urine sample. I told them to call and be seen by his PCP, Dr. Felipa Eth today. I told them to call back with any questions.

## 2015-06-01 NOTE — Telephone Encounter (Signed)
CALLED PATIENT'S DAUGHTER -TARA TO INFORM OF APPT. WITH NURSE PRACTIONER  @ [facility] ON 06-01-15- ARRIVAL TIME - 10:55 AM, SPOKE WITH HIS DAUGHTER AND SHE IS AWARE OF THIS APPT.

## 2015-06-04 ENCOUNTER — Telehealth: Payer: Self-pay | Admitting: Cardiology

## 2015-06-04 NOTE — Telephone Encounter (Signed)
Spoke with pts daughter and discussed she thinks he missed some doses last week, as pt is becoming a little confused. She states they were unaware of his dose, thus gave him 1 pill on Fri, Sat and Sun. Therefore, instructed her to give him 2 pills today and regular 2 pills tomorrow and keep  follow up appt Wednesday. Please instructed Grandson to ask for 2 copies of Anti-coag encounter.

## 2015-06-04 NOTE — Telephone Encounter (Signed)
New message      Daughter states that pt has been confused and not sure what dosage of coumadin he is to be taking.  Please call and let her know.  He has an appt this week.

## 2015-06-06 ENCOUNTER — Ambulatory Visit (INDEPENDENT_AMBULATORY_CARE_PROVIDER_SITE_OTHER): Payer: Medicare Other | Admitting: *Deleted

## 2015-06-06 DIAGNOSIS — Z5181 Encounter for therapeutic drug level monitoring: Secondary | ICD-10-CM

## 2015-06-06 DIAGNOSIS — I4891 Unspecified atrial fibrillation: Secondary | ICD-10-CM | POA: Diagnosis not present

## 2015-06-06 DIAGNOSIS — I482 Chronic atrial fibrillation, unspecified: Secondary | ICD-10-CM

## 2015-06-06 LAB — POCT INR: INR: 1.5

## 2015-06-07 ENCOUNTER — Other Ambulatory Visit (HOSPITAL_COMMUNITY): Payer: Self-pay | Admitting: General Surgery

## 2015-06-07 DIAGNOSIS — D171 Benign lipomatous neoplasm of skin and subcutaneous tissue of trunk: Secondary | ICD-10-CM

## 2015-06-10 ENCOUNTER — Other Ambulatory Visit: Payer: Self-pay | Admitting: Cardiology

## 2015-06-13 ENCOUNTER — Ambulatory Visit (INDEPENDENT_AMBULATORY_CARE_PROVIDER_SITE_OTHER): Payer: Medicare Other | Admitting: *Deleted

## 2015-06-13 DIAGNOSIS — I4891 Unspecified atrial fibrillation: Secondary | ICD-10-CM | POA: Diagnosis not present

## 2015-06-13 DIAGNOSIS — Z5181 Encounter for therapeutic drug level monitoring: Secondary | ICD-10-CM | POA: Diagnosis not present

## 2015-06-13 DIAGNOSIS — I482 Chronic atrial fibrillation, unspecified: Secondary | ICD-10-CM

## 2015-06-13 LAB — POCT INR: INR: 3.1

## 2015-06-15 ENCOUNTER — Other Ambulatory Visit: Payer: Self-pay | Admitting: Radiology

## 2015-06-19 ENCOUNTER — Ambulatory Visit (INDEPENDENT_AMBULATORY_CARE_PROVIDER_SITE_OTHER): Payer: Medicare Other | Admitting: Neurology

## 2015-06-19 ENCOUNTER — Encounter: Payer: Self-pay | Admitting: *Deleted

## 2015-06-19 ENCOUNTER — Other Ambulatory Visit: Payer: Self-pay | Admitting: Radiology

## 2015-06-19 ENCOUNTER — Telehealth: Payer: Self-pay | Admitting: Neurology

## 2015-06-19 ENCOUNTER — Encounter: Payer: Self-pay | Admitting: Neurology

## 2015-06-19 VITALS — BP 118/67 | HR 60 | Ht 66.0 in | Wt 198.0 lb

## 2015-06-19 DIAGNOSIS — I482 Chronic atrial fibrillation, unspecified: Secondary | ICD-10-CM

## 2015-06-19 DIAGNOSIS — R413 Other amnesia: Secondary | ICD-10-CM | POA: Diagnosis not present

## 2015-06-19 DIAGNOSIS — I63332 Cerebral infarction due to thrombosis of left posterior cerebral artery: Secondary | ICD-10-CM | POA: Diagnosis not present

## 2015-06-19 DIAGNOSIS — R269 Unspecified abnormalities of gait and mobility: Secondary | ICD-10-CM

## 2015-06-19 DIAGNOSIS — I639 Cerebral infarction, unspecified: Secondary | ICD-10-CM | POA: Insufficient documentation

## 2015-06-19 NOTE — Progress Notes (Signed)
PATIENT: [name] Gill DOB: [birth year]  Chief Complaint  Patient presents with  . Memory Loss    MMSE 18/30 - 8 animals.  He is here with his wife, Mardene Celeste and his daughter, [name].  Reports having a sudden decline in memory over the last month.     HISTORICAL  [name] Gill is 81 years old right-handed male, accompanied by his wife, and his daughter [name], seen in refer by his primary care physician Dr. Felipa Eth in June 19 2015 for memory loss   He had a history of chronic atrial fibrillation, on Coumadin treatment, pacemaker around 2010, severe COPD, incidental finding of stage I lung cancer status post radiation therapy in 2015, is a heavy smoker 2 packs each day, quit around 1999, obstructive sleep , using CPAP machine followed by Dr. Annamaria Boots, coronary artery disease status post CABG in 2004, cardiac catheter in 2009, chronic renal insufficiency, stage III, anemia, abnormal glucose  He is a retired Forensic psychologist for [employer], was very active at home, take care of his wife, he was driving until April 26 2015, he had acute onset of confusion, daughter calls him almost daily, noticed he has intermittent confusion, slurred speech, at the same time, he was noted to have gait difficulty, fell few times, over the past one month, he had slight improvement, but he still tends to repeat himself, continue have unsteady gait, he has good appetite, sleep well,  I have personally reviewed CAT scan of the brain with and without contrast in November 2016 at [facility], cerebral atrophy, with probable remote or late subacute lacunar infarction in the pons, I reviewed laboratory evaluation in November 2016, normal CBC with exception of hemoglobin 11, creatinine was 1.4  He takes care of his dog and cat at home,  Echocardiogram in 2015: The estimated ejection fraction was inn the range of 55% to 60%. Basal inferior akinesis.Indeterminant diastolic function.  REVIEW OF SYSTEMS: Full  14 system review of systems performed and notable only for memory loss, confusion, slurred speech, fatigue, swelling in legs, hearing loss ALLERGIES: No Known Allergies  HOME MEDICATIONS: Current Outpatient Prescriptions  Medication Sig Dispense Refill  . Acai 500 MG CAPS Take 500 mg by mouth 2 (two) times daily.    . Ascorbic Acid (VITAMIN C) 500 MG tablet Take 500 mg by mouth daily.      [name] atorvastatin (LIPITOR) 80 MG tablet Take 80 mg by mouth daily.     . beta carotene w/minerals (OCUVITE) tablet Take 2 tablets by mouth daily.     . Bisacodyl (DULCOLAX PO) Take 1 tablet by mouth daily.    [name] CARTIA XT 180 MG 24 hr capsule TAKE 1 CAPSULE BY MOUTH DAILY (Patient taking differently: TAKE 180 MG BY MOUTH DAILY) 30 capsule 1  . Cholecalciferol (VITAMIN D PO) Take 1 tablet by mouth daily.     [name] co-enzyme Q-10 30 MG capsule Take 60 mg by mouth 2 (two) times daily.     . fenofibrate 54 MG tablet Take 54 mg by mouth Daily.     . ferrous sulfate 325 (65 FE) MG tablet Take 325 mg by mouth daily with breakfast.    . FOLIC ACID PO Take 1 tablet by mouth daily.     . furosemide (LASIX) 80 MG tablet TAKE ONE TABLET BY MOUTH EVERY MORNING AND 1/2 OF A TABLET BY MOUTH EVERY AFTERNOON. (Patient taking differently: TAKE 80 MG BY MOUTH EVERY MORNING AND 40 MG BY MOUTH EVERY  AFTERNOON.) 135 tablet 2  . GRAPE SEED EXTRACT PO Take 1 capsule by mouth 2 (two) times daily.    . Lycopene 10 MG CAPS Take 10 mg by mouth daily.    . Misc Natural Products (BLACK CHERRY CONCENTRATE PO) Take 2 capsules by mouth daily.    . Multiple Vitamin (MULTIVITAMIN PO) Take 1 tablet by mouth daily.     [name] NITROSTAT 0.4 MG SL tablet Place 0.4 mg under the tongue every 5 (five) minutes as needed for chest pain. Take as directed    . Omega-3 Fatty Acids (FISH OIL) 1000 MG CAPS Take 3,000 mg by mouth daily.     . pantoprazole (PROTONIX) 40 MG tablet Take 40 mg by mouth daily.     . potassium chloride (KLOR-CON 10) 10 MEQ CR tablet Take  10 mEq by mouth daily.      [name] RESVERATROL PO Take 1 capsule by mouth 2 (two) times daily.    . saw palmetto 160 MG capsule Take 800 mg by mouth daily. Takes 5 capsules daily    . SELENIUM PO Take 1 tablet by mouth daily.     [name] SPIRIVA HANDIHALER 18 MCG inhalation capsule INHALE CONTENTS OF 1 CAPSULE BY MOUTH DAILY AS DIRECTED 90 capsule 0  . SYMBICORT 160-4.5 MCG/ACT inhaler INHALE 2 PUFFS INTO THE LUNGS 2 TIMES DAILY 10.2 g 5  . TURMERIC PO Take 2 capsules by mouth daily.     [name] warfarin (COUMADIN) 2.5 MG tablet TAKE AS DIRECTED BY THE COUMADIN CLINIC *THIS IS A 30 DAYS SUPPLY* (Patient taking differently: Take 5 mg by mouth every day except 2.5 mg by mouth on Monday and Saturday.) 60 tablet 3  . warfarin (COUMADIN) 2.5 MG tablet TAKE AS DIRECTED BY THE COUMADIN CLINIC *THIS IS A 30 DAYS SUPPLY* 60 tablet 3   No current facility-administered medications for this visit.    PAST MEDICAL HISTORY: Past Medical History  Diagnosis Date  . Atrial fibrillation (HCC)     coumadin, pacemaker/ CHRONIC  . Diverticulitis 1975  . Hypertension   . COPD (chronic obstructive pulmonary disease) (HCC)     FEV1 1.35/58% 01-11-10  . Pacemaker -Medtronic     Medtronic  . Ventricular tachycardia, non-sustained (HCC)     Detected on the device  . Hyperlipidemia     goal LDL less than 70  . PVD (peripheral vascular disease) ([place])     w mod left external carotid artery stenosis.   . Obesity   . Ichthyosis   . Renal insufficiency     stage III   . Hyperglycemia     mild glucose 111 on 11/2010  . H/O echocardiogram     nl LVF w severe ASSH and mod cLVH, mildly dilated aorta, milf LAE, mild MR/TR and moderate AVSC  . Incisional hernia     2 upper and 1 lower abdominal incisional hernia.   . Low testosterone     start androgel  . Tachycardia-bradycardia syndrome ([place])     s/p PPM  . Chronic anticoagulation   . CHD (coronary heart disease)     s/p CABG with patents grafts at cath 2009  . Aortic root  dilatation ([place])   . CHF (congestive heart failure) ([place])   . Obesity   . Cataract      no surgery  . GERD (gastroesophageal reflux disease)   . Dysrhythmia     afib  . OSA (obstructive sleep apnea)     04-04-08 NPSG AHI  14.5, 3L/M suppl O2 for nadir 86%  . H/O hiatal hernia   . Arthritis   . Anemia     hx  . Malignant neoplasm of lower lobe, bronchus, or lung 11/22/2013  . Radiation 5/5,57/,5/11,5/13,5/152015    right upper lobe 60 Gy in 5 fractions  . Memory loss     PAST SURGICAL HISTORY: Past Surgical History  Procedure Laterality Date  . Broken nose    . Pacemaker insertion  02/2008    medtronic  . Coronary artery bypass graft  4580,9983    coronary disease s/p CABG 2004 and Cath 2009  . Cardiac catheterization  2009  . Pacemaker insertion  11/14  . Colon surgery  85    diverticulitis  . Back surgery      herniated disc  . Hernia repair    . Parotidectomy Left 12/15/2013    WITH NECK DISECTION  . Parotidectomy Left 12/15/2013    Procedure: SUPEFICIAL PAROTIDECTOMY WITH FACIAL NERVE DISSECTION;  Surgeon: [name], MD;  Location: [facility];  Service: ENT;  Laterality: Left;  . Permanent pacemaker generator change N/A 06/01/2013    Procedure: PERMANENT PACEMAKER GENERATOR CHANGE;  Surgeon: [name], MD;  Location: [facility] CATH LAB;  Service: Cardiovascular;  Laterality: N/A;    FAMILY HISTORY: Family History  Problem Relation Age of Onset  . Tuberculosis Father     arrested  . Cancer Father   . Aortic stenosis Mother     ASCVD    SOCIAL HISTORY:  Social History   Social History  . Marital Status: Married    Spouse Name: N/A  . Number of Children: 4  . Years of Education: College   Occupational History  . retired     [employer] attorney   Social History Main Topics  . Smoking status: Former Smoker -- 2.00 packs/day for 55 years    Types: Cigarettes    Quit date: 10/19/1997  . Smokeless tobacco: Never Used  . Alcohol Use: 0.0 oz/week     0 Standard drinks or equivalent per week     Comment: One glass of wine daily.  . Drug Use: No  . Sexual Activity: Not on file   Other Topics Concern  . Not on file   Social History Narrative   Lives at home with wife.   Right-handed.   1-2 cups coffee per day.     PHYSICAL EXAM   Filed Vitals:   06/19/15 1303  BP: 118/67  Pulse: 60  Height: '5\' 6"'$  (1.676 m)  Weight: 198 lb (89.812 kg)    Not recorded      Body mass index is 31.97 kg/(m^2).  PHYSICAL EXAMNIATION:  Gen: NAD, conversant, well nourised, obese, well groomed                     Cardiovascular: Regular rate rhythm, no peripheral edema, warm, nontender. Eyes: Conjunctivae clear without exudates or hemorrhage Neck: Supple, no carotid bruise. Pulmonary: Clear to auscultation bilaterally   NEUROLOGICAL EXAM:  MENTAL STATUS: Speech:    Speech is normal; fluent and spontaneous with normal comprehension.  Cognition: Mini-Mental Status Examination is 18 out of 30, animal naming was 8     Orientation to time, place and person: She is not oriented to date, year, day,     Recent and remote memory: She missed 3 out of 3 recalls     Normal Attention span and concentration     Normal Language, naming,  repeating,spontaneous speech, has difficulty following three-step command, has difficulty copy design     Fund of knowledge   CRANIAL NERVES: CN II: Visual fields are full to confrontation. Fundoscopic exam is normal with sharp discs and no vascular changes. Pupils are round equal and briskly reactive to light. CN III, IV, VI: extraocular movement are normal. No ptosis. CN V: Facial sensation is intact to pinprick in all 3 divisions bilaterally. Corneal responses are intact.  CN VII: Face is symmetric with normal eye closure and smile. CN VIII: Hearing is normal to rubbing fingers CN IX, X: Palate elevates symmetrically. Phonation is normal. CN XI: Head turning and shoulder shrug are intact CN XII: Tongue is  midline with normal movements and no atrophy.  MOTOR: There is no pronator drift of out-stretched arms. Muscle bulk and tone are normal. Muscle strength is normal.  REFLEXES: Reflexes are 2+ and symmetric at the biceps, triceps, knees, and ankles. Plantar responses are flexor.  SENSORY: Intact to light touch, pinprick, position sense, and vibration sense are intact in fingers and toes.  COORDINATION: Rapid alternating movements and fine finger movements are intact. There is no dysmetria on finger-to-nose and heel-knee-shin.    GAIT/STANCE: Anterocollis, mildly unsteady gait  DIAGNOSTIC DATA (LABS, IMAGING, TESTING) - I reviewed patient records, labs, notes, testing and imaging myself where available.   ASSESSMENT AND PLAN  [name] Gill is a 81 y.o. male   Acute onset of memory loss  Differentiation diagnosis including stroke, versus central nervous system degenerative disorder  CAT scan of the brain showed generalized atrophy, left pontine old versus subacute stroke  Complete evaluation with ultrasound of carotid artery, echocardiogram  Laboratory evaluation from primary care office Chronic atrial fibrillation  On Coumadin daily Gait difficulties   home physical therapy     [name], M.D. Ph.D.  [facility] [address], [place], [place] [address] Ph: [phone] Fax: [phone]  CC:Lajean Manes, MD

## 2015-06-19 NOTE — Telephone Encounter (Signed)
Please change US CAROTID BILATERAL order# to [id number]. The order needs to be changed before I can schedule with appointment. Please advise once the order is updated. Thanks!

## 2015-06-20 ENCOUNTER — Ambulatory Visit (HOSPITAL_COMMUNITY): Admission: RE | Admit: 2015-06-20 | Payer: Medicare Other | Source: Ambulatory Visit

## 2015-06-21 ENCOUNTER — Ambulatory Visit: Payer: Medicare Other | Admitting: Internal Medicine

## 2015-06-22 ENCOUNTER — Ambulatory Visit (HOSPITAL_COMMUNITY)
Admission: RE | Admit: 2015-06-22 | Discharge: 2015-06-22 | Disposition: A | Discharge status: Home / Self Care | Payer: Medicare Other | Source: Ambulatory Visit | Attending: Neurology | Admitting: Neurology

## 2015-06-22 DIAGNOSIS — R410 Disorientation, unspecified: Secondary | ICD-10-CM | POA: Insufficient documentation

## 2015-06-22 DIAGNOSIS — I482 Chronic atrial fibrillation, unspecified: Secondary | ICD-10-CM

## 2015-06-22 DIAGNOSIS — R413 Other amnesia: Secondary | ICD-10-CM | POA: Diagnosis not present

## 2015-06-22 DIAGNOSIS — R269 Unspecified abnormalities of gait and mobility: Secondary | ICD-10-CM | POA: Insufficient documentation

## 2015-06-22 DIAGNOSIS — I63332 Cerebral infarction due to thrombosis of left posterior cerebral artery: Secondary | ICD-10-CM | POA: Diagnosis not present

## 2015-06-22 NOTE — Progress Notes (Signed)
Preliminary results by tech - Carotid Duplex Completed. Mild plaque visualized in both internal carotid arteries with no significant stenosis noted. Vertebral arteries demonstrate antegrade flow. Oda Cogan, BS, RDMS, RVT

## 2015-06-26 ENCOUNTER — Telehealth: Payer: Self-pay | Admitting: Neurology

## 2015-06-26 NOTE — Telephone Encounter (Signed)
[facility] called and needs verbal orders for PT. 1 week 2, 2 week 4 and 1 week 2. Please call and [facility] [phone]

## 2015-06-26 NOTE — Telephone Encounter (Signed)
Spoke to Elliott - she is the physical therapist working with this patient - ok per Dr. Krista Blue to continue PT.  Verbal orders provided - she will fax over additional orders for her signature.

## 2015-06-27 ENCOUNTER — Ambulatory Visit (INDEPENDENT_AMBULATORY_CARE_PROVIDER_SITE_OTHER): Payer: Medicare Other | Admitting: Pharmacist

## 2015-06-27 DIAGNOSIS — Z5181 Encounter for therapeutic drug level monitoring: Secondary | ICD-10-CM | POA: Diagnosis not present

## 2015-06-27 DIAGNOSIS — I4891 Unspecified atrial fibrillation: Secondary | ICD-10-CM

## 2015-06-27 DIAGNOSIS — I482 Chronic atrial fibrillation, unspecified: Secondary | ICD-10-CM

## 2015-06-27 LAB — POCT INR: INR: 4

## 2015-06-28 ENCOUNTER — Other Ambulatory Visit: Payer: Self-pay | Admitting: *Deleted

## 2015-06-28 DIAGNOSIS — R269 Unspecified abnormalities of gait and mobility: Secondary | ICD-10-CM

## 2015-06-28 DIAGNOSIS — R413 Other amnesia: Secondary | ICD-10-CM

## 2015-06-28 DIAGNOSIS — I482 Chronic atrial fibrillation, unspecified: Secondary | ICD-10-CM

## 2015-06-28 DIAGNOSIS — I63332 Cerebral infarction due to thrombosis of left posterior cerebral artery: Secondary | ICD-10-CM

## 2015-06-28 NOTE — Telephone Encounter (Signed)
Nicki Reaper would like a call from the physician to discuss his father.  Cell [phone].

## 2015-06-28 NOTE — Telephone Encounter (Signed)
I have called his son Nicki Reaper, discussed his father's medical condition, he has multiple vascular risk factor, already had gradual onset memory trouble over the past couple years, sudden worsening since October 2016, not a candidate for MRI due to pacemaker, CAT scan showed chronic versus subacute to chronic left pontine, which could potentially explain his acute worsening gait difficulty, memory loss,  Continue workup, return to clinic in July 30 2014, may consider adding on Namenda 10 mg twice a day, Aricept 10 mg daily

## 2015-06-28 NOTE — Telephone Encounter (Signed)
[facility] PT with [facility] says pt  is refusing home health services and wants out pt. [facility] will not be seeing the pt [phone]

## 2015-06-28 NOTE — Telephone Encounter (Signed)
Please call patient, ultrasound of carotid artery showed less than 40% stenosis at bilateral internal carotid artery, keep the previously discussed treatment plan  1. No evidence of a significant stenosis in the right or left  carotid arteries - 1-39%. 2. 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

## 2015-06-28 NOTE — Telephone Encounter (Signed)
Spoke to patient's wife and son, Nicki Reaper (both on HIPPA) - they would prefer the patient have outpatient PT so that he is getting dressed and out of the house some.  New orders placed for outpatient PT and they are aware to expect a call for scheduling.

## 2015-06-28 NOTE — Telephone Encounter (Signed)
Spoke to his son (on HIPPA) - he is aware of results and will provide to the patient.

## 2015-07-11 ENCOUNTER — Ambulatory Visit (INDEPENDENT_AMBULATORY_CARE_PROVIDER_SITE_OTHER): Payer: Medicare Other | Admitting: *Deleted

## 2015-07-11 DIAGNOSIS — I4891 Unspecified atrial fibrillation: Secondary | ICD-10-CM | POA: Diagnosis not present

## 2015-07-11 DIAGNOSIS — Z5181 Encounter for therapeutic drug level monitoring: Secondary | ICD-10-CM | POA: Diagnosis not present

## 2015-07-11 DIAGNOSIS — I482 Chronic atrial fibrillation, unspecified: Secondary | ICD-10-CM

## 2015-07-11 LAB — POCT INR: INR: 2.4

## 2015-07-19 ENCOUNTER — Ambulatory Visit: Payer: Medicare Other | Admitting: Rehabilitation

## 2015-07-25 ENCOUNTER — Ambulatory Visit (INDEPENDENT_AMBULATORY_CARE_PROVIDER_SITE_OTHER): Payer: Medicare Other | Admitting: *Deleted

## 2015-07-25 DIAGNOSIS — I482 Chronic atrial fibrillation, unspecified: Secondary | ICD-10-CM

## 2015-07-25 DIAGNOSIS — I4891 Unspecified atrial fibrillation: Secondary | ICD-10-CM | POA: Diagnosis not present

## 2015-07-25 DIAGNOSIS — Z5181 Encounter for therapeutic drug level monitoring: Secondary | ICD-10-CM | POA: Diagnosis not present

## 2015-07-25 LAB — POCT INR: INR: 2

## 2015-07-26 ENCOUNTER — Telehealth: Payer: Self-pay | Admitting: Physical Therapy

## 2015-07-26 ENCOUNTER — Encounter: Payer: Self-pay | Admitting: Physical Therapy

## 2015-07-26 ENCOUNTER — Ambulatory Visit: Payer: Medicare Other | Attending: Neurology | Admitting: Physical Therapy

## 2015-07-26 DIAGNOSIS — R269 Unspecified abnormalities of gait and mobility: Secondary | ICD-10-CM | POA: Insufficient documentation

## 2015-07-26 DIAGNOSIS — R29898 Other symptoms and signs involving the musculoskeletal system: Secondary | ICD-10-CM | POA: Insufficient documentation

## 2015-07-26 NOTE — Therapy (Signed)
[facility] [address] [place], [place], [address] Phone: [phone]   Fax:  [phone]  Physical Therapy Evaluation  Patient Details  Name: [name] Gill MRN: [id number] Date of Birth: [birth year] Referring Provider: Dr. [name]  Encounter Date: 07/26/2015      PT End of Session - 07/26/15 1314    Visit Number 1  G1   Date for PT Re-Evaluation 08/26/15   Authorization Type Medicare   Authorization Time Period 07-26-15 - 09-22-15   PT Start Time 0932   PT Stop Time 1018   PT Time Calculation (min) 46 min      Past Medical History  Diagnosis Date  . Atrial fibrillation (HCC)     coumadin, pacemaker/ CHRONIC  . Diverticulitis 1975  . Hypertension   . COPD (chronic obstructive pulmonary disease) (HCC)     FEV1 1.35/58% 01-11-10  . Pacemaker -Medtronic     Medtronic  . Ventricular tachycardia, non-sustained (HCC)     Detected on the device  . Hyperlipidemia     goal LDL less than 70  . PVD (peripheral vascular disease) ([place])     w mod left external carotid artery stenosis.   . Obesity   . Ichthyosis   . Renal insufficiency     stage III   . Hyperglycemia     mild glucose 111 on 11/2010  . H/O echocardiogram     nl LVF w severe ASSH and mod cLVH, mildly dilated aorta, milf LAE, mild MR/TR and moderate AVSC  . Incisional hernia     2 upper and 1 lower abdominal incisional hernia.   . Low testosterone     start androgel  . Tachycardia-bradycardia syndrome ([place])     s/p PPM  . Chronic anticoagulation   . CHD (coronary heart disease)     s/p CABG with patents grafts at cath 2009  . Aortic root dilatation ([place])   . CHF (congestive heart failure) ([place])   . Obesity   . Cataract      no surgery  . GERD (gastroesophageal reflux disease)   . Dysrhythmia     afib  . OSA (obstructive sleep apnea)     04-04-08 NPSG AHI 14.5, 3L/M suppl O2 for nadir 86%  . H/O hiatal hernia   . Arthritis   . Anemia     hx   . Malignant neoplasm of lower lobe, bronchus, or lung 11/22/2013  . Radiation 5/5,57/,5/11,5/13,5/152015    right upper lobe 60 Gy in 5 fractions  . Memory loss     Past Surgical History  Procedure Laterality Date  . Broken nose    . Pacemaker insertion  02/2008    medtronic  . Coronary artery bypass graft  3825,0539    coronary disease s/p CABG 2004 and Cath 2009  . Cardiac catheterization  2009  . Pacemaker insertion  11/14  . Colon surgery  85    diverticulitis  . Back surgery      herniated disc  . Hernia repair    . Parotidectomy Left 12/15/2013    WITH NECK DISECTION  . Parotidectomy Left 12/15/2013    Procedure: SUPEFICIAL PAROTIDECTOMY WITH FACIAL NERVE DISSECTION;  Surgeon: [name], MD;  Location: [facility];  Service: ENT;  Laterality: Left;  . Permanent pacemaker generator change N/A 06/01/2013    Procedure: PERMANENT PACEMAKER GENERATOR CHANGE;  Surgeon: [name], MD;  Location: [facility] CATH LAB;  Service: Cardiovascular;  Laterality: N/A;  There were no vitals filed for this visit.  Visit Diagnosis:  Abnormality of gait - Plan: PT plan of care cert/re-cert  Weakness of right lower extremity - Plan: PT plan of care cert/re-cert      Subjective Assessment - 07/26/15 1250    Subjective Pt reports he isn't sure why he is here for PT - states he does not have any problems with balance or walking   Pertinent History CVA - possibly Aug. 2016 (exact date unknown)   Patient Stated Goals pt isn't sure he needs PT - son wants him evaluated since he has been diagnosed with having had a CVA - wants recommendations for any needed equipment or exercise   Currently in Pain? No/denies            [facility] PT Assessment - 07/26/15 0951    Assessment   Medical Diagnosis Gait Abnormality;  s/p CVA   Referring Provider Dr. [name]   Onset Date/Surgical Date --  August 2016   Precautions   Precautions Fall   Balance Screen   Has the patient fallen in the past 6  months Yes  about 6 weeks   How many times? 1   Has the patient had a decrease in activity level because of a fear of falling?  No   Is the patient reluctant to leave their home because of a fear of falling?  No   Home Environment   Living Environment Private residence   Type of [place] One level   Prior Function   Level of Independence Independent with household mobility without device;Independent with basic ADLs  pt is currently not driving   ROM / Strength   AROM / PROM / Strength Strength  RLE is slightly weaker than LLE   Ambulation/Gait   Ambulation/Gait Yes   Ambulation/Gait Assistance 6: Modified independent (Device/Increase time)   Ambulation Distance (Feet) 100 Feet   Assistive device None  pt states he    Gait Pattern Step-through pattern   Ambulation Surface Level;Indoor   Standardized Balance Assessment   Standardized Balance Assessment Timed Up and Go Test   Berg Balance Test   Sit to Stand Able to stand without using hands and stabilize independently   Standing Unsupported Able to stand safely 2 minutes   Sitting with Back Unsupported but Feet Supported on Floor or Stool Able to sit safely and securely 2 minutes   Stand to Sit Sits safely with minimal use of hands   Transfers Able to transfer safely, minor use of hands   Standing Unsupported with Eyes Closed Able to stand 10 seconds safely   Standing Ubsupported with Feet Together Able to place feet together independently and stand 1 minute safely   From Standing, Reach Forward with Outstretched Arm Can reach confidently >25 cm (10")   From Standing Position, Pick up Object from Floor Able to pick up shoe safely and easily   From Standing Position, Turn to Look Behind Over each Shoulder Looks behind from both sides and weight shifts well   Turn 360 Degrees Able to turn 360 degrees safely but slowly  L= 6.87   R= 6.47 secs   Standing Unsupported, Alternately Place  Feet on Step/Stool Able to complete >2 steps/needs minimal assist   Standing Unsupported, One Foot in Front Able to plae foot ahead of the other independently and hold 30 seconds   Standing on One Leg Tries to lift leg/unable  to hold 3 seconds but remains standing independently   Total Score 47   Timed Up and Go Test   Normal TUG (seconds) 11.65  no device                                PT Long Term Goals - 08/09/15 1325    PT LONG TERM GOAL #1   Title Independent in HEP for balance and strengthening.  (08-26-15)   Time 4   Period Weeks   Status New   PT LONG TERM GOAL #2   Title Amb. with use of cane with S 200' on even and uneven surfaces.  (08-26-15)   Time 4   Period Weeks   Status New   PT LONG TERM GOAL #3   Title Negotiate curb and ramp with cane with SBA for incr. safety with community ambulation.  (08-26-15)   Time 4   Period Weeks   Status New   PT LONG TERM GOAL #4   Title Report plan to transition back to Silver Sneakers upon D/C from PT.  (08-26-15)   Period Weeks   Status New               Plan - 08/09/2015 1316    Clinical Impression Statement Pt is an 82 year old gentleman with gait abnormality and mild balance deficits.  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  Clinical presentation is stable. Pt is at minimal fall risk per Merrilee Jansky with score 47/56 and no fall risk per TUG iwth score 11.65.  Pt would benefit from use of cane for assistance with community ambulation due to decr. hihg level balance skills.   Pt will benefit from skilled therapeutic intervention in order to improve on the following deficits Decreased balance;Decreased cognition;Abnormal gait;Decreased strength   Rehab Potential Good   PT Frequency 2x / week   PT Duration 2 weeks    PT Next Visit Plan instruct in HEP for balance and strengthening - sit to stand, heel raises, exercises in standing with theraband   PT Home Exercise Plan see above   Consulted and Agree with Plan of Care Patient;Family member/caregiver   Family Member Consulted son, Ginger Carne - 08/09/15 1330    Functional Assessment Tool Used Berg score 47/56;  TUG score 11.65 secs without device   Functional Limitation Mobility: Walking and moving around   Mobility: Walking and Moving Around Current Status [phone]) At least 20 percent but less than 40 percent impaired, limited or restricted   Mobility: Walking and Moving Around Goal Status [phone]) At least 1 percent but less than 20 percent impaired, limited or restricted       Problem List Patient Active Problem List   Diagnosis Date Noted  . Memory loss 06/19/2015  . Abnormality of gait 06/19/2015  . Stroke ([place]) 06/19/2015  . Dysuria 05/31/2015  . Parotid mass 12/15/2013  . Primary cancer of right lower lobe of lung ([place]) 11/22/2013  . Encounter for therapeutic drug monitoring 10/07/2013  . Tachycardia-bradycardia syndrome ([place])   . Chronic anticoagulation   . CAD (coronary artery disease)   . Lung nodule 10/06/2013  . Parotid neoplasm 06/11/2013  . Ventricular tachycardia, non-sustained (  [place])   . Renal insufficiency   . Pacemaker -Medtronic   . Rhinitis 11/18/2011  . Gastroenteritis, acute 11/18/2011  . OBESITY HYPOVENTILATION SYNDROME 12/03/2009  . Dyslipidemia 05/09/2008  . DYSPNEA 03/21/2008  . Atrial fibrillation ([place]) 03/01/2008  . COPD with emphysema ([place]) 03/01/2008  . Obstructive sleep apnea 03/01/2008    Alda Lea, PT 07/26/2015, 1:34 PM  [facility] [address] [place], [place], [address] Phone: [phone]   Fax:  [phone]  Name: [name] Gill MRN: [id number] Date of Birth: 27-Feb-1926

## 2015-07-26 NOTE — Telephone Encounter (Signed)
Dr. Krista Blue, [name] Gill was evaluated by PT this am - we are requesting a referral for OT due to c/o's decreased fine motor coordination with difficulty with buttons etc. per his son's report - if you agree would you please send a referral through Epic for OT? He is doing very well overall and I feel that only a few sessions of PT and OT would be beneficial for him to maximize his current functional status. He and his son, [name] are in agreement with the request for an OT referral -  Thank you, Guido Sander, PT

## 2015-07-26 NOTE — Addendum Note (Signed)
Addended by: [name] on: 07/26/2015 01:59 PM   Modules accepted: Orders

## 2015-07-31 ENCOUNTER — Telehealth: Payer: Self-pay | Admitting: Cardiology

## 2015-07-31 ENCOUNTER — Encounter: Payer: Self-pay | Admitting: Neurology

## 2015-07-31 ENCOUNTER — Telehealth: Payer: Self-pay | Admitting: *Deleted

## 2015-07-31 ENCOUNTER — Ambulatory Visit (INDEPENDENT_AMBULATORY_CARE_PROVIDER_SITE_OTHER): Payer: Medicare Other | Admitting: Neurology

## 2015-07-31 VITALS — BP 122/78 | HR 72 | Ht 66.0 in | Wt 204.0 lb

## 2015-07-31 DIAGNOSIS — F0390 Unspecified dementia without behavioral disturbance: Secondary | ICD-10-CM | POA: Diagnosis not present

## 2015-07-31 MED ORDER — MEMANTINE HCL 10 MG PO TABS: 10.0000 mg | ORAL_TABLET | Freq: Two times a day (BID) | ORAL | Status: AC

## 2015-07-31 NOTE — Progress Notes (Signed)
Chief Complaint  Patient presents with  . Memory Loss    MMSE 18/30 - 9 animals. He is here with his daughter, [name].  Feels he has had intermittent moments of clarity in his memory.  They would like to discuss his test results today.  . Gait Problem    He will be starting PT this week at neuro rehab.      PATIENT: [name] Gill DOB: 12/26/1925  Chief Complaint  Patient presents with  . Memory Loss    MMSE 18/30 - 9 animals. He is here with his daughter, [name].  Feels he has had intermittent moments of clarity in his memory.  They would like to discuss his test results today.  . Gait Problem    He will be starting PT this week at neuro rehab.     HISTORICAL  [name] Gill is 82 years old right-handed male, accompanied by his wife, and his daughter [name], seen in refer by his primary care physician Dr. Felipa Eth in June 19 2015 for memory loss   He had a history of chronic atrial fibrillation, on Coumadin treatment, pacemaker around 2010, severe COPD, incidental finding of stage I lung cancer status post radiation therapy in 2015, is a heavy smoker 2 packs each day, quit around 1999, obstructive sleep , using CPAP machine followed by Dr. Annamaria Boots, coronary artery disease status post CABG in 2004, cardiac catheter in 2009, chronic renal insufficiency, stage III, anemia, abnormal glucose  He is a retired Forensic psychologist for [employer], was very active at home, take care of his wife, he was driving until April 26 2015, he had acute onset of confusion, daughter calls him almost daily, noticed he has intermittent confusion, slurred speech, at the same time, he was noted to have gait difficulty, fell few times, over the past one month, he had slight improvement, but he still tends to repeat himself, continue have unsteady gait, he has good appetite, sleep well,  I have personally reviewed CAT scan of the brain with and without contrast in November 2016 at [facility], cerebral atrophy,  with probable remote or late subacute lacunar infarction in the pons, I reviewed laboratory evaluation in November 2016, normal CBC with exception of hemoglobin 11, creatinine was 1.4  He takes care of his dog and cat at home,  Echocardiogram in 2015: The estimated ejection fraction was inn the range of 55% to 60%. Basal inferior akinesis.Indeterminant diastolic function.  UPDATE Jul 31 2015: He was started outpatient physical therapy soon, he continued to have intermittent confusion, tends to repeat himself, slurred speech sometimes, getting worse during ear infection and urinary tract infection,  Ultrasound of carotid artery showed less than 40% stenosis bilateral internal carotid artery, also right thyroid no vascular mass, I have forwarded reported to his primary care Dr. Felipa Eth, who will continue follow-up on it  REVIEW OF SYSTEMS: Full 14 system review of systems performed and notable only for memory loss, confusion, slurred speech, fatigue, swelling in legs, hearing loss ALLERGIES: No Known Allergies  HOME MEDICATIONS: Current Outpatient Prescriptions  Medication Sig Dispense Refill  . atorvastatin (LIPITOR) 80 MG tablet Take 80 mg by mouth daily.     . beta carotene w/minerals (OCUVITE) tablet Take 2 tablets by mouth daily.     . Bisacodyl (DULCOLAX PO) Take 1 tablet by mouth daily.    [name] CARTIA XT 180 MG 24 hr capsule TAKE 1 CAPSULE BY MOUTH DAILY (Patient taking differently: TAKE 180 MG BY MOUTH DAILY) 30  capsule 1  . fenofibrate 54 MG tablet Take 54 mg by mouth Daily.     . ferrous sulfate 325 (65 FE) MG tablet Take 325 mg by mouth daily with breakfast.    . furosemide (LASIX) 80 MG tablet TAKE ONE TABLET BY MOUTH EVERY MORNING AND 1/2 OF A TABLET BY MOUTH EVERY AFTERNOON. (Patient taking differently: TAKE 80 MG BY MOUTH EVERY MORNING AND 40 MG BY MOUTH EVERY AFTERNOON.) 135 tablet 2  . NITROSTAT 0.4 MG SL tablet Place 0.4 mg under the tongue every 5 (five) minutes as needed  for chest pain. Take as directed    . pantoprazole (PROTONIX) 40 MG tablet Take 40 mg by mouth daily.     . potassium chloride (KLOR-CON 10) 10 MEQ CR tablet Take 10 mEq by mouth daily.      [name] SPIRIVA HANDIHALER 18 MCG inhalation capsule INHALE CONTENTS OF 1 CAPSULE BY MOUTH DAILY AS DIRECTED 90 capsule 0  . SYMBICORT 160-4.5 MCG/ACT inhaler INHALE 2 PUFFS INTO THE LUNGS 2 TIMES DAILY 10.2 g 5  . warfarin (COUMADIN) 2.5 MG tablet TAKE AS DIRECTED BY THE COUMADIN CLINIC *THIS IS A 30 DAYS SUPPLY* (Patient taking differently: Take 5 mg by mouth every day except 2.5 mg by mouth on Monday and Saturday.) 60 tablet 3   No current facility-administered medications for this visit.    PAST MEDICAL HISTORY: Past Medical History  Diagnosis Date  . Atrial fibrillation (HCC)     coumadin, pacemaker/ CHRONIC  . Diverticulitis 1975  . Hypertension   . COPD (chronic obstructive pulmonary disease) (HCC)     FEV1 1.35/58% 01-11-10  . Pacemaker -Medtronic     Medtronic  . Ventricular tachycardia, non-sustained (HCC)     Detected on the device  . Hyperlipidemia     goal LDL less than 70  . PVD (peripheral vascular disease) ([place])     w mod left external carotid artery stenosis.   . Obesity   . Ichthyosis   . Renal insufficiency     stage III   . Hyperglycemia     mild glucose 111 on 11/2010  . H/O echocardiogram     nl LVF w severe ASSH and mod cLVH, mildly dilated aorta, milf LAE, mild MR/TR and moderate AVSC  . Incisional hernia     2 upper and 1 lower abdominal incisional hernia.   . Low testosterone     start androgel  . Tachycardia-bradycardia syndrome ([place])     s/p PPM  . Chronic anticoagulation   . CHD (coronary heart disease)     s/p CABG with patents grafts at cath 2009  . Aortic root dilatation ([place])   . CHF (congestive heart failure) ([place])   . Obesity   . Cataract      no surgery  . GERD (gastroesophageal reflux disease)   . Dysrhythmia     afib  . OSA (obstructive sleep  apnea)     04-04-08 NPSG AHI 14.5, 3L/M suppl O2 for nadir 86%  . H/O hiatal hernia   . Arthritis   . Anemia     hx  . Malignant neoplasm of lower lobe, bronchus, or lung 11/22/2013  . Radiation 5/5,57/,5/11,5/13,5/152015    right upper lobe 60 Gy in 5 fractions  . Memory loss     PAST SURGICAL HISTORY: Past Surgical History  Procedure Laterality Date  . Broken nose    . Pacemaker insertion  02/2008    medtronic  . Coronary artery bypass  graft  0350,0938    coronary disease s/p CABG 2004 and Cath 2009  . Cardiac catheterization  2009  . Pacemaker insertion  11/14  . Colon surgery  85    diverticulitis  . Back surgery      herniated disc  . Hernia repair    . Parotidectomy Left 12/15/2013    WITH NECK DISECTION  . Parotidectomy Left 12/15/2013    Procedure: SUPEFICIAL PAROTIDECTOMY WITH FACIAL NERVE DISSECTION;  Surgeon: [name], MD;  Location: [place];  Service: ENT;  Laterality: Left;  . Permanent pacemaker generator change N/A 06/01/2013    Procedure: PERMANENT PACEMAKER GENERATOR CHANGE;  Surgeon: [name], MD;  Location: [facility] CATH LAB;  Service: Cardiovascular;  Laterality: N/A;    FAMILY HISTORY: Family History  Problem Relation Age of Onset  . Tuberculosis Father     arrested  . Cancer Father   . Aortic stenosis Mother     ASCVD    SOCIAL HISTORY:  Social History   Social History  . Marital Status: Married    Spouse Name: N/A  . Number of Children: 4  . Years of Education: College   Occupational History  . retired     [employer] attorney   Social History Main Topics  . Smoking status: Former Smoker -- 2.00 packs/day for 55 years    Types: Cigarettes    Quit date: 10/19/1997  . Smokeless tobacco: Never Used  . Alcohol Use: 0.0 oz/week    0 Standard drinks or equivalent per week     Comment: One glass of wine daily.  . Drug Use: No  . Sexual Activity: Not on file   Other Topics Concern  . Not on file   Social History  Narrative   Lives at home with wife.   Right-handed.   1-2 cups coffee per day.     PHYSICAL EXAM   Filed Vitals:   07/31/15 1135  BP: 122/78  Pulse: 72  Height: '5\' 6"'$  (1.676 m)  Weight: 204 lb (92.534 kg)    Not recorded      Body mass index is 32.94 kg/(m^2).  PHYSICAL EXAMNIATION:  Gen: NAD, conversant, well nourised, obese, well groomed                     Cardiovascular: Regular rate rhythm, no peripheral edema, warm, nontender. Eyes: Conjunctivae clear without exudates or hemorrhage Neck: Supple, no carotid bruise. Pulmonary: Clear to auscultation bilaterally   NEUROLOGICAL EXAM:  MENTAL STATUS: Speech:    Speech is normal; fluent and spontaneous with normal comprehension.  Cognition: Mini-Mental Status Examination is 18 out of 30, animal naming was 9     Orientation to time, place and person: he is not oriented to date, year, day,     Recent and remote memory: he missed 3 out of 3 recalls     Normal Attention span and concentration     Normal Language, naming, repeating,spontaneous speech, has difficulty following three-step command, has difficulty copy design     Fund of knowledge   CRANIAL NERVES: CN II: Visual fields are full to confrontation. Fundoscopic exam is normal with sharp discs and no vascular changes. Pupils are round equal and briskly reactive to light. CN III, IV, VI: extraocular movement are normal. No ptosis. CN V: Facial sensation is intact to pinprick in all 3 divisions bilaterally. Corneal responses are intact.  CN VII: Face is symmetric with normal eye closure and smile. CN  VIII: Hearing is normal to rubbing fingers CN IX, X: Palate elevates symmetrically. Phonation is normal. CN XI: Head turning and shoulder shrug are intact CN XII: Tongue is midline with normal movements and no atrophy.  MOTOR: There is no pronator drift of out-stretched arms. Muscle bulk and tone are normal. Muscle strength is normal.  REFLEXES: Reflexes are 2+  and symmetric at the biceps, triceps, knees, and ankles. Plantar responses are flexor.  SENSORY: Intact to light touch, pinprick, position sense, and vibration sense are intact in fingers and toes.  COORDINATION: Rapid alternating movements and fine finger movements are intact. There is no dysmetria on finger-to-nose and heel-knee-shin.    GAIT/STANCE: mildly unsteady, wide based gait  DIAGNOSTIC DATA (LABS, IMAGING, TESTING) - I reviewed patient records, labs, notes, testing and imaging myself where available.   ASSESSMENT AND PLAN  Aikeem Lilley is a 82 y.o. male    Dementia  Mini-Mental Status Examination is 28 out of 30  Differentiation diagnosis including stroke, versus central nervous system degenerative disorder  CAT scan of the brain showed generalized atrophy, left pontine old versus subacute stroke  Start Namenda '10mg'$  bid  Chronic atrial fibrillation  Take Coumadin daily  Gait difficulties   home physical therapy     [name], M.D. Ph.D.  [facility] [address] [place], [place] [address] Ph: [phone] Fax: [phone]  CC:Lajean Manes, MD

## 2015-07-31 NOTE — Telephone Encounter (Signed)
Echo was never completed. Had [facility] call the patient. Patient scheduled ECHO for 1/24.  Results will be available after that date.

## 2015-07-31 NOTE — Telephone Encounter (Signed)
Pt is scheduled for 08/14/15.

## 2015-07-31 NOTE — Telephone Encounter (Signed)
New message      [facility] ordered pt to have an echo.  They cannot see it in epic. Did Dr Radford Pax read it?

## 2015-07-31 NOTE — Telephone Encounter (Signed)
Left message for echocardiogram scheduler to call me back to see if this test has been completed.

## 2015-08-06 ENCOUNTER — Telehealth: Payer: Self-pay | Admitting: Neurology

## 2015-08-06 DIAGNOSIS — R269 Unspecified abnormalities of gait and mobility: Secondary | ICD-10-CM

## 2015-08-06 NOTE — Telephone Encounter (Signed)
Patient's daughter [name] is calling in regard to her need for a referral for PT.  Intially her father was having in-home therapy but wanted OP therapy.  She states that now he would like to go back to in-home therapy.  She apologized for the confusion but states her father's mental status after the stroke is not good.  Please call.

## 2015-08-06 NOTE — Telephone Encounter (Signed)
Spoke to pt's daughter [name] (per DPR) and advised her that Dr. Krista Blue placed an order for home PT and a company will be calling her to set that up. Pt's daughter verbalized understanding and appreciation.

## 2015-08-06 NOTE — Telephone Encounter (Signed)
I have referred him to home physical therapy, please let patient's daughter [name] know

## 2015-08-07 ENCOUNTER — Ambulatory Visit: Payer: Medicare Other | Admitting: Physical Therapy

## 2015-08-08 ENCOUNTER — Ambulatory Visit (INDEPENDENT_AMBULATORY_CARE_PROVIDER_SITE_OTHER): Payer: Medicare Other | Admitting: Podiatry

## 2015-08-08 ENCOUNTER — Encounter: Payer: Self-pay | Admitting: Podiatry

## 2015-08-08 DIAGNOSIS — M79676 Pain in unspecified toe(s): Secondary | ICD-10-CM | POA: Diagnosis not present

## 2015-08-08 DIAGNOSIS — B351 Tinea unguium: Secondary | ICD-10-CM

## 2015-08-08 NOTE — Progress Notes (Signed)
Patient ID: [name] Gill, male   DOB: July 16, 1926, 82 y.o.   MRN: [id number]  Subjective: This patient presents for ongoing care complaining of thick and elongated toenails which are cough walking wearing shoes request nail debridement. Patient's grandson and patient's grandson's friend present in treatment room  Objective: Hard of hearing patient presents orientated 3 Dry scaling skin dorsally plantarly bilaterally No open skin lesions bilaterally The toenails are elongated, brittle, discolored, incurvated, deformed and tender to direct palpation 6-10  Assessment: Symptomatic onychomycoses 6-10 Dry scaling skin bilaterally  Plan: Debrided toenails 6-10 mechanical and electrical without any bleeding Advised patient to use all-purpose skin lotion daily on legs and feet  Reappoint 3 months

## 2015-08-08 NOTE — Patient Instructions (Signed)
Use all-purpose skin lotion on your lower legs and feet on ongoing daily basis

## 2015-08-09 ENCOUNTER — Ambulatory Visit: Payer: Medicare Other | Admitting: Physical Therapy

## 2015-08-13 ENCOUNTER — Ambulatory Visit: Payer: Medicare Other | Admitting: Physical Therapy

## 2015-08-14 ENCOUNTER — Other Ambulatory Visit (HOSPITAL_COMMUNITY): Payer: Medicare Other

## 2015-08-15 ENCOUNTER — Ambulatory Visit (INDEPENDENT_AMBULATORY_CARE_PROVIDER_SITE_OTHER): Payer: Medicare Other | Admitting: Internal Medicine

## 2015-08-15 ENCOUNTER — Ambulatory Visit (HOSPITAL_COMMUNITY): Payer: Medicare Other | Attending: Internal Medicine

## 2015-08-15 ENCOUNTER — Encounter: Payer: Self-pay | Admitting: Internal Medicine

## 2015-08-15 ENCOUNTER — Other Ambulatory Visit: Payer: Self-pay

## 2015-08-15 VITALS — BP 140/80 | HR 68 | Ht 66.0 in | Wt 196.6 lb

## 2015-08-15 DIAGNOSIS — E669 Obesity, unspecified: Secondary | ICD-10-CM | POA: Insufficient documentation

## 2015-08-15 DIAGNOSIS — I340 Nonrheumatic mitral (valve) insufficiency: Secondary | ICD-10-CM | POA: Insufficient documentation

## 2015-08-15 DIAGNOSIS — Z87891 Personal history of nicotine dependence: Secondary | ICD-10-CM | POA: Diagnosis not present

## 2015-08-15 DIAGNOSIS — I482 Chronic atrial fibrillation, unspecified: Secondary | ICD-10-CM

## 2015-08-15 DIAGNOSIS — I639 Cerebral infarction, unspecified: Secondary | ICD-10-CM | POA: Diagnosis not present

## 2015-08-15 DIAGNOSIS — E785 Hyperlipidemia, unspecified: Secondary | ICD-10-CM | POA: Insufficient documentation

## 2015-08-15 DIAGNOSIS — I739 Peripheral vascular disease, unspecified: Secondary | ICD-10-CM | POA: Diagnosis not present

## 2015-08-15 DIAGNOSIS — I63332 Cerebral infarction due to thrombosis of left posterior cerebral artery: Secondary | ICD-10-CM | POA: Diagnosis not present

## 2015-08-15 DIAGNOSIS — I480 Paroxysmal atrial fibrillation: Secondary | ICD-10-CM

## 2015-08-15 DIAGNOSIS — I341 Nonrheumatic mitral (valve) prolapse: Secondary | ICD-10-CM | POA: Insufficient documentation

## 2015-08-15 DIAGNOSIS — I472 Ventricular tachycardia: Secondary | ICD-10-CM

## 2015-08-15 DIAGNOSIS — R413 Other amnesia: Secondary | ICD-10-CM

## 2015-08-15 DIAGNOSIS — R269 Unspecified abnormalities of gait and mobility: Secondary | ICD-10-CM | POA: Diagnosis not present

## 2015-08-15 DIAGNOSIS — I517 Cardiomegaly: Secondary | ICD-10-CM | POA: Insufficient documentation

## 2015-08-15 DIAGNOSIS — I5032 Chronic diastolic (congestive) heart failure: Secondary | ICD-10-CM | POA: Insufficient documentation

## 2015-08-15 DIAGNOSIS — G4733 Obstructive sleep apnea (adult) (pediatric): Secondary | ICD-10-CM | POA: Insufficient documentation

## 2015-08-15 DIAGNOSIS — I10 Essential (primary) hypertension: Secondary | ICD-10-CM | POA: Diagnosis not present

## 2015-08-15 DIAGNOSIS — Z950 Presence of cardiac pacemaker: Secondary | ICD-10-CM

## 2015-08-15 DIAGNOSIS — I350 Nonrheumatic aortic (valve) stenosis: Secondary | ICD-10-CM | POA: Insufficient documentation

## 2015-08-15 DIAGNOSIS — I4729 Other ventricular tachycardia: Secondary | ICD-10-CM

## 2015-08-15 DIAGNOSIS — I071 Rheumatic tricuspid insufficiency: Secondary | ICD-10-CM | POA: Insufficient documentation

## 2015-08-15 DIAGNOSIS — I358 Other nonrheumatic aortic valve disorders: Secondary | ICD-10-CM | POA: Insufficient documentation

## 2015-08-15 LAB — CUP PACEART INCLINIC DEVICE CHECK
Battery Impedance: 236 Ohm
Brady Statistic RV Percent Paced: 34 %
Implantable Lead Implant Date: 20090820
Implantable Lead Model: 4092
Lead Channel Impedance Value: 0 Ohm
Lead Channel Sensing Intrinsic Amplitude: 11.2 mV
Lead Channel Setting Pacing Amplitude: 2 V
Lead Channel Setting Pacing Pulse Width: 0.4 ms
MDC IDC LEAD LOCATION: 753860
MDC IDC MSMT BATTERY REMAINING LONGEVITY: 112 mo
MDC IDC MSMT BATTERY VOLTAGE: 2.79 V
MDC IDC MSMT LEADCHNL RV IMPEDANCE VALUE: 511 Ohm
MDC IDC MSMT LEADCHNL RV PACING THRESHOLD AMPLITUDE: 0.5 V
MDC IDC MSMT LEADCHNL RV PACING THRESHOLD PULSEWIDTH: 0.4 ms
MDC IDC SESS DTM: 20170125143933
MDC IDC SET LEADCHNL RV SENSING SENSITIVITY: 2.8 mV

## 2015-08-15 MED ORDER — FUROSEMIDE 80 MG PO TABS: 80.0000 mg | ORAL_TABLET | Freq: Two times a day (BID) | ORAL | Status: AC

## 2015-08-15 NOTE — Assessment & Plan Note (Signed)
His medtronic DDD PPM is working normally. Will recheck in several months.

## 2015-08-15 NOTE — Progress Notes (Signed)
HPI Mr. Enck returns today for PPM followup. He is a pleasant 82 yo man with a h/o chronic atrial fibrillation and symptomatic bradycardia, s/p PPM insertion 2 years ago. He has had no syncope in the interim. He has class 2 dyspnea which is multifactorial. He denies chest pain. He has moderate copd and sleep apnea. He was diagnosed with lung CA over a year ago and underwent XRT remotely. He has had trouble with peripheral edema. He admits to dietary indiscretion with sodium.  No Known Allergies   Current Outpatient Prescriptions  Medication Sig Dispense Refill  . beta carotene w/minerals (OCUVITE) tablet Take 2 tablets by mouth daily.     . Bisacodyl (DULCOLAX PO) Take 1 tablet by mouth daily.    [name] CARTIA XT 180 MG 24 hr capsule TAKE 1 CAPSULE BY MOUTH DAILY (Patient taking differently: TAKE 180 MG BY MOUTH DAILY) 30 capsule 1  . fenofibrate 54 MG tablet Take 54 mg by mouth Daily.     . ferrous sulfate 325 (65 FE) MG tablet Take 325 mg by mouth daily with breakfast.    . furosemide (LASIX) 80 MG tablet Take 1 tablet (80 mg total) by mouth 2 (two) times daily. 180 tablet 3  . memantine (NAMENDA) 10 MG tablet Take 1 tablet (10 mg total) by mouth 2 (two) times daily. 60 tablet 11  . NITROSTAT 0.4 MG SL tablet Place 0.4 mg under the tongue every 5 (five) minutes as needed for chest pain. Take as directed    . pantoprazole (PROTONIX) 40 MG tablet Take 40 mg by mouth daily.     . potassium chloride (KLOR-CON 10) 10 MEQ CR tablet Take 10 mEq by mouth daily.      [name] SPIRIVA HANDIHALER 18 MCG inhalation capsule INHALE CONTENTS OF 1 CAPSULE BY MOUTH DAILY AS DIRECTED 90 capsule 0  . SYMBICORT 160-4.5 MCG/ACT inhaler INHALE 2 PUFFS INTO THE LUNGS 2 TIMES DAILY 10.2 g 5  . warfarin (COUMADIN) 2.5 MG tablet TAKE AS DIRECTED BY THE COUMADIN CLINIC *THIS IS A 30 DAYS SUPPLY* (Patient taking differently: Take 5 mg by mouth every day except 2.5 mg by mouth on Monday and Saturday.) 60 tablet 3   No  current facility-administered medications for this visit.     Past Medical History  Diagnosis Date  . Atrial fibrillation (HCC)     coumadin, pacemaker/ CHRONIC  . Diverticulitis 1975  . Hypertension   . COPD (chronic obstructive pulmonary disease) (HCC)     FEV1 1.35/58% 01-11-10  . Pacemaker -Medtronic     Medtronic  . Ventricular tachycardia, non-sustained (HCC)     Detected on the device  . Hyperlipidemia     goal LDL less than 70  . PVD (peripheral vascular disease) ([place])     w mod left external carotid artery stenosis.   . Obesity   . Ichthyosis   . Renal insufficiency     stage III   . Hyperglycemia     mild glucose 111 on 11/2010  . H/O echocardiogram     nl LVF w severe ASSH and mod cLVH, mildly dilated aorta, milf LAE, mild MR/TR and moderate AVSC  . Incisional hernia     2 upper and 1 lower abdominal incisional hernia.   . Low testosterone     start androgel  . Tachycardia-bradycardia syndrome ([place])     s/p PPM  . Chronic anticoagulation   . CHD (coronary heart disease)  s/p CABG with patents grafts at cath 2009  . Aortic root dilatation ([place])   . CHF (congestive heart failure) ([place])   . Obesity   . Cataract      no surgery  . GERD (gastroesophageal reflux disease)   . Dysrhythmia     afib  . OSA (obstructive sleep apnea)     04-04-08 NPSG AHI 14.5, 3L/M suppl O2 for nadir 86%  . H/O hiatal hernia   . Arthritis   . Anemia     hx  . Malignant neoplasm of lower lobe, bronchus, or lung 11/22/2013  . Radiation 5/5,57/,5/11,5/13,5/152015    right upper lobe 60 Gy in 5 fractions  . Memory loss     ROS:   All systems reviewed and negative except as noted in the HPI.   Past Surgical History  Procedure Laterality Date  . Broken nose    . Pacemaker insertion  02/2008    medtronic  . Coronary artery bypass graft  4650,3546    coronary disease s/p CABG 2004 and Cath 2009  . Cardiac catheterization  2009  . Pacemaker insertion  11/14  . Colon surgery   85    diverticulitis  . Back surgery      herniated disc  . Hernia repair    . Parotidectomy Left 12/15/2013    WITH NECK DISECTION  . Parotidectomy Left 12/15/2013    Procedure: SUPEFICIAL PAROTIDECTOMY WITH FACIAL NERVE DISSECTION;  Surgeon: [name], MD;  Location: [facility];  Service: ENT;  Laterality: Left;  . Permanent pacemaker generator change N/A 06/01/2013    Procedure: PERMANENT PACEMAKER GENERATOR CHANGE;  Surgeon: [name], MD;  Location: [facility] CATH LAB;  Service: Cardiovascular;  Laterality: N/A;     Family History  Problem Relation Age of Onset  . Tuberculosis Father     arrested  . Cancer Father   . Aortic stenosis Mother     ASCVD     Social History   Social History  . Marital Status: Married    Spouse Name: N/A  . Number of Children: 4  . Years of Education: College   Occupational History  . retired     [employer] attorney   Social History Main Topics  . Smoking status: Former Smoker -- 2.00 packs/day for 55 years    Types: Cigarettes    Quit date: 10/19/1997  . Smokeless tobacco: Never Used  . Alcohol Use: 0.0 oz/week    0 Standard drinks or equivalent per week     Comment: One glass of wine daily.  . Drug Use: No  . Sexual Activity: Not on file   Other Topics Concern  . Not on file   Social History Narrative   Lives at home with wife.   Right-handed.   1-2 cups coffee per day.     BP 140/80 mmHg  Pulse 68  Ht '5\' 6"'$  (1.676 m)  Wt 196 lb 9.6 oz (89.177 kg)  BMI 31.75 kg/m2  Physical Exam:  Well appearing elderly man, NAD HEENT: Unremarkable Neck:  6 cm JVD, no thyromegally Back:  No CVA tenderness Lungs:  Clear with no wheezes HEART:  Regular rate rhythm, no murmurs, no rubs, no clicks Abd:  soft, obese, positive bowel sounds, no organomegally, no rebound, no guarding Ext:  2 plus pulses, 3+ woody edema, no cyanosis, no clubbing Skin:  No rashes no nodules Neuro:  CN II through XII intact, motor grossly  intact  DEVICE  Normal  device function.  See PaceArt for details.   Assess/Plan:

## 2015-08-15 NOTE — Assessment & Plan Note (Signed)
He is maintaining NSR. He will continue his current meds. 

## 2015-08-15 NOTE — Assessment & Plan Note (Signed)
His symptoms are class 3. He has severe peripheral edema. I have recommended he wear compression stocking and increase his dose of diuretic.

## 2015-08-15 NOTE — Assessment & Plan Note (Signed)
He is asymptomatic. No change in meds.

## 2015-08-15 NOTE — Patient Instructions (Signed)
Medication Instructions:  Your physician has recommended you make the following change in your medication:  1) STOP Lipitor 2) INCREASE Lasix to 80 mg twice a day  Labwork: None ordered  Testing/Procedures: None ordered  Follow-Up: Remote monitoring is used to monitor your Pacemaker of ICD from home. This monitoring reduces the number of office visits required to check your device to one time per year. It allows [place] to keep an eye on the functioning of your device to ensure it is working properly. You are scheduled for a device check from home on 11/14/15. You may send your transmission at any time that day. If you have a wireless device, the transmission will be sent automatically. After your physician reviews your transmission, you will receive a postcard with your next transmission date.  Your physician wants you to follow-up in: 1 year with Dr. Lovena Le. You will receive a reminder letter in the mail two months in advance. If you don't receive a letter, please call our office to schedule the follow-up appointment.  If you need a refill on your cardiac medications before your next appointment, please call your pharmacy.   Any Other Special Instructions Will Be Listed Below (If Applicable). Decrease sodium intake.   Start using TED hose (compression hose) - handwritten prescription given to you    Thank you for choosing [facility]!!

## 2015-08-16 ENCOUNTER — Telehealth: Payer: Self-pay | Admitting: Internal Medicine

## 2015-08-16 ENCOUNTER — Telehealth: Payer: Self-pay | Admitting: Neurology

## 2015-08-16 ENCOUNTER — Ambulatory Visit: Payer: Medicare Other | Admitting: Physical Therapy

## 2015-08-16 NOTE — Telephone Encounter (Signed)
New problem   Pt's daughter is requesting call back to go back over pt's echo results. Please call

## 2015-08-16 NOTE — Telephone Encounter (Signed)
Duplicate encounter - see other note for more information.

## 2015-08-16 NOTE — Telephone Encounter (Signed)
Pt's daughter called and would like to talk with someone about results. Her mother took the call this morning but does not understand what was said. Please call and advise [phone]-Daughter , [name] Gill.

## 2015-08-16 NOTE — Telephone Encounter (Signed)
I have called patient ECHO result, forward the result to Dr. Felipa Eth  - Compared to a prior study in 2015, the EF is higher with severe focal basal septal hypertrophy. The aortic valve is calcified, but only very mildly stenotic. There is moderate MR, with late anterior prolapse of the mitral leaflets.

## 2015-08-16 NOTE — Telephone Encounter (Signed)
Please call his daughter, go over ECHO report again with her

## 2015-08-16 NOTE — Telephone Encounter (Signed)
Dr. Krista Blue has the message and she is going to call her back.

## 2015-08-16 NOTE — Telephone Encounter (Signed)
New message     Calling to get echo results.  Someone called and talked to [name] Gill but she does not remember what they said

## 2015-08-16 NOTE — Telephone Encounter (Signed)
Follow Up   Pt daughter req a call back for the results. Pt daughter states that she has called three times and is requesting a called back before the end of the day.

## 2015-08-16 NOTE — Telephone Encounter (Signed)
Will forward to Dr Krista Blue.  He call patient yesterday

## 2015-08-16 NOTE — Telephone Encounter (Signed)
error 

## 2015-08-17 NOTE — Telephone Encounter (Signed)
Have called his daughter, explained the Echocardiogram findings,

## 2015-08-22 ENCOUNTER — Ambulatory Visit (INDEPENDENT_AMBULATORY_CARE_PROVIDER_SITE_OTHER): Payer: Medicare Other | Admitting: *Deleted

## 2015-08-22 DIAGNOSIS — I4891 Unspecified atrial fibrillation: Secondary | ICD-10-CM | POA: Diagnosis not present

## 2015-08-22 DIAGNOSIS — I480 Paroxysmal atrial fibrillation: Secondary | ICD-10-CM

## 2015-08-22 DIAGNOSIS — Z5181 Encounter for therapeutic drug level monitoring: Secondary | ICD-10-CM

## 2015-08-22 LAB — POCT INR: INR: 2.2

## 2015-09-10 ENCOUNTER — Encounter: Payer: Self-pay | Admitting: Internal Medicine

## 2015-09-10 ENCOUNTER — Ambulatory Visit (INDEPENDENT_AMBULATORY_CARE_PROVIDER_SITE_OTHER): Payer: Medicare Other | Admitting: Internal Medicine

## 2015-09-10 VITALS — BP 122/72 | HR 62 | Ht 66.0 in | Wt 189.4 lb

## 2015-09-10 DIAGNOSIS — Z23 Encounter for immunization: Secondary | ICD-10-CM

## 2015-09-10 DIAGNOSIS — I63332 Cerebral infarction due to thrombosis of left posterior cerebral artery: Secondary | ICD-10-CM | POA: Diagnosis not present

## 2015-09-10 DIAGNOSIS — J449 Chronic obstructive pulmonary disease, unspecified: Secondary | ICD-10-CM

## 2015-09-10 DIAGNOSIS — E662 Morbid (severe) obesity with alveolar hypoventilation: Secondary | ICD-10-CM | POA: Diagnosis not present

## 2015-09-10 DIAGNOSIS — C3431 Malignant neoplasm of lower lobe, right bronchus or lung: Secondary | ICD-10-CM

## 2015-09-10 NOTE — Patient Instructions (Signed)
Recommend you try to use the BIPAP machine to help your breathing at night  Continue Symbicort 2 puffs, then rinse mouth, twice daily  Continue Spiriva 1 inhalation daily  Flu vax

## 2015-09-10 NOTE — Progress Notes (Signed)
[name], MD 11/18/2011 9:09 PM Pended  Patient ID: [name] Gill, male DOB: [birth year], 82 y.o. MRN: [id number]  HPI  43 yoM followed for OSA and COPD complicated by CAD and AFib/ pacemaker. with obesity hypoventilation syndrome. Last here May 15, 2010. Since then he finished Pulmonary Rehab and says it was life changing. Pacing himself he can now walk around the block with a few rest stops. We talked about ways to maintain the momentum. Otherwise quiet winter- had one respiratory infection- not bad. Heart ok- no new problems or changes by Dr Radford Pax. FEV1 was 1.35/ 58% in 2011. Dr Felipa Eth did CXR during the winter.  He continues BIPAP 11/9 with O2 2 L/M, used at least 5-6 hours every night.  Denies routine cough, phlegm, chest pain, palpitation, bloody or purulent sputum.   05/16/11- 36 yoM followed for OSA and COPD complicated by CAD and AFib/ pacemaker, obesity hypoventilation syndrome.  Wife is here. Had flu shot and shingles vaccine. He feels he is doing very well. Pulmonary rehabilitation was a big help and he has lost 28 pounds since April. He is now in the maintenance program. Comfortable off of oxygen in the daytime. Using Spiriva and Symbicort. Never aware of wheeze. Denies cough or chest pain. He has an old nebulizer machine and rescue inhaler that he never needs. He doesn't notice that he wheezes.  He continues BiPAP 11/9 with oxygen at 2 L, every night for at least 5 or 6 hours. His wife comments that he now stays asleep on long drives and she thinks he is doing very well.   11/14/11- 52 yoM followed for OSA and COPD complicated by CAD and AFib/ pacemaker, obesity hypoventilation syndrome.  He continues to use BiPAP with inspiratory pressure 11/expiratory pressure 9 and says he is doing well with this.  He considers his COPD status "wonderful". Pulmonary rehabilitation as a big help and has made a significant difference in his strength over the past year. Has lost some weight.  Little cough or wheeze. Sleeps with oxygen 2 L. Using Symbicort twice daily and Spiriva. Occasional epistaxis and nasal stuffiness attributed to old nasal trauma.  11/18/11- 46 yoM followed for OSA and COPD complicated by CAD and AFib/ pacemaker, obesity hypoventilation syndrome.  PCP Dr Felipa Eth Just here 4 days ago. Wife brings him in today because he didn't tell me at last visit about recent problems she wanted me to know about. Oxygen saturation had been running 96-98% but has been lower in the past few days. Malaise. Had a gastroenteritis syndrome February 27 and similar March 21. In the last few days has felt tired, chills, feet swelling, stomach bloating. More short of breath off and on. Denies pain, fever, purulent discharge, leg pain or cramps. Was taking AndroGel but stopped last week. Has been eating a lot of licorice, up to 20 sticks/ day over the past month, initially to treat constipation. Then he just kept on because he liked it. Has history of mild renal insufficiency. Has been eating a banana a day and potassium supplement. He reports that the pulmonary rehabilitation nurse thought he might be fluid overloaded. In the last 2 days he had been eating boullion, accepting salt input in order to lose weight by avoiding sandwiches.  05/21/12- 10 yoM followed for OSA and COPD complicated by CAD and AFib/ pacemaker, obesity hypoventilation syndrome.  PCP Dr Felipa Eth   had flu vaccine Feels no longer needs O2-been about 4-5 months since used last; is having severe dry mouth(caused  tooth problems); would like to have another mask (not full face mask-[facility]). Had lost weight and since then has not used oxygen at all. Treated for atrial fibrillation with pacemaker. Doing very well and pulmonary rehabilitation program. Does not require oxygen during that exercise. Continues BiPAP 11/9/[facility] with no humidifier. I discussed the humidifier given his concern about dry mouth. COPD assessment test (CAT)  3/40  11/24/12 87 yoM followed for OSA and COPD complicated by CAD and AFib/ pacemaker, obesity hypoventilation syndrome     PCP Dr Felipa Eth FOLLOWS FOR: patient reports he is feeling much better with his breathing esp after losing 60lbs-- c/o excessive dry mouth--wearing BiPAP11/9  every night for approx 4 hrs per night- tolerating pressure ok He had lost weight, about 25 pounds by our scale while working in pulmonary rehabilitation. He admits he is gained some back. He is now going to [employer]. Denies cough wheeze, edema or chest pain. Using a nasal decongestant spray about twice daily and we discussed this, recommending he cut way down. CXR 11/19/11 IMPRESSION:  Mild basilar predominant pulmonary edema and small bilateral  effusions.  Original Report Authenticated By: Duayne Cal, M.D.  05/27/13- 73 yoM former smoker hefollowed for OSA and COPD complicated by CAD and AFib/ pacemaker, obesity hypoventilation syndrome     PCP Dr Felipa Eth FOLLOWS FOR:  Pt states he did have "cold" and was recently treated with abx. Wears BiPAP  11/9every night for about 4-5 hours; gets extreme dry mouth-has tried biotene since last visit and helps slightly. DME is [facility]. Dryness accentuated by furosemide and Spiriva. Still doing "Silver sneakers" As he was getting over a recent viral pattern cold syndrome he found a lymph node left neck -not tender CXR 5/ 7 / 14 IMPRESSION:  Mild chronic lung disease with bibasilar scarring. No acute  cardiopulmonary abnormality.  Original Report Authenticated By: Carl Best, M.D.  10/06/13- 51 yoM former smoker followed for OSA and COPD complicated by CAD and AFib/ pacemaker, obesity hypoventilation syndrome      PCP Dr Caleen Essex and wife here. L parotid lesion being followed by Dr Lucia Gaskins ENT. Work-up led to CXR and CT chest- RUL nodule w/ central cavitation. PET positive w/o evident mets. We reviewed images together. He denies unusual dyspnea, chest pain,  cough or wheeze and family concurs. No blood and no nodes or masses except L parotid.  He has f/u appt tomorrow w/ Dr Radford Pax Cardiology. BIPAP 11/9 with O2 2L for sleep/ [facility].  CT neck 09/07/13 IMPRESSION:  1. Within the left parotid gland primarily in the superficial lobe  there are 2 adjacent circumscribed soft tissue masses. The larger  more inferior measures up to 22 mm diameter. 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  2. No regional lymphadenopathy. No other neck mass.  3. There is a partially visible 13 mm right lung nodule, not evident  on the 11/24/2012 chest radiographs. Recommend followup chest CT (IV  contrast preferred but could be omitted if the patient has any  history of renal insufficiency) to evaluate persistence and/or  characterize further.  4. 15 mm right thyroid nodule, significance doubtful in this age  group.  Electronically Signed  By: Lars Pinks M.D.  On: 09/07/2013 12:20 CT CHEST WITH CONTRAST 09/20/13 IMPRESSION:  1. Centrally cavitary right upper lobe pulmonary nodule. Suspicious  for primary bronchogenic carcinoma. This is superimposed upon mild  to moderate centrilobular emphysema. Consider further  evaluation  with PET.  2. Probable scarring at the left apex. This is somewhat nodular more  medially. This could be re-evaluated at followup PET.  3. Cardiomegaly with small bilateral pleural effusions, suggesting a  component of congestive heart failure/fluid overload.  4. Thoracic adenopathy. This is nonspecific in the setting of  congestive heart failure. Could be secondary. However, metastatic  disease cannot be excluded.  5. Pulmonary artery enlargement suggests pulmonary arterial  hypertension.  6. Cholelithiasis.  7. Incompletely imaged transverse colon containing ventral abdominal  wall hernia.  8. Indeterminate right-sided thyroid nodule. This  could be further  evaluated with thyroid ultrasound. However, given patient age and  other comorbidities, of questionable significance.  Electronically Signed  By: Abigail Miyamoto M.D.  On: 09/23/2013 10:17 PET 09/27/13 IMPRESSION:  1. Hypermetabolism corresponding to the cavitary posterior right  upper lobe lung nodule. Most consistent with primary bronchogenic  carcinoma. The thoracic nodes are not hypermetabolic and are  decreased in size. There were likely related to a congestive heart  failure exacerbation. Therefore, presuming non-small-cell histology,  most consistent with T1aN0M0 or stage IA.  2. Hypermetabolic left parotid nodules. 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 Please see neck CT report of 09/07/2013.  3. Hypermetabolism at the left side of the vallecula. Favored to be  physiologic and related to secretions in this area. This area is  underdistended. This could be re-evaluated on follow-up PET or if  there is a clinical concern of mucosal lesion, direct visualization  performed.  4. Incidental findings, including common iliac artery dilatation,  gallstones, and ventral abdominal wall hernias.  Electronically Signed  By: Abigail Miyamoto M.D.  On: 10/04/2013 16:31  11/24/13- 57 yoM former smoker followed for OSA, COPD, cavitary lung nodule/ Sq Cell Ca/ XRT, complicated by CAD and AFib/ pacemaker, obesity hypoventilation syndrome     PCP Dr Felipa Eth FOLLOWS FOR:  Pt reports breathing is doing well and has no concerns today Gill bx of RUL nodule + squamous cell Ca on 4/17. Has started XRT.  Breathing is comfortable with little cough or wheeze now. He no longer has oxygen for sleep BiPAP 11/9-[facility] all night every night  PFT 01/11/10- moderate obstructive airways disease, insignificant response to bronchodilator. FVC 2.28/ 62%, FEV1 1.35/ 58%, FEV1/FVC 0.59, TLC 90%, DLCO 57%  05/29/14- 88 yoM former smoker followed for OSA and COPD complicated by CAD and AFib/  pacemaker, obesity hypoventilation syndrome , CHF,cavitary RUL squamous cell ca, parotid neoplasm    PCP Dr Felipa Eth BIPAP 11/9 Wears BiPAP every night through [place].  Pt states his breathing has been well since last visit; has had multiple health issues and surgeries since last visit.  Finished XRT  For R lung cancer, had basal cell carcinoma resected from scalp and benign left parotid tumor resected. CT chest 01/17/14 IMPRESSION: 1. Reduced size of the right upper lobe pulmonary nodule, indicating interval effective therapy. 2. Subtle 9 mm new ground-glass opacity in the right lower lobe, probably postinflammatory given that it was not present recently, but meriting observation on follow-up exams. 3. Centrilobular emphysema. 4. Upper abdominal ventral hernias, some containing transverse colon in a Richter configuration. 5. Cholelithiasis. 6. There are 2 areas of soft tissue fullness in the tail the pancreas. Both had low-grade metabolic activity on recent PET-CT, but were also present and not significantly changed from 2010. Accordingly, I believe these are probably benign Electronically Signed  By: Sherryl Barters M.D.  On: 01/17/2014 16:19  11/27/14- 25 yoM former smoker followed  for OSA and COPD, lung nodules, complicated by CAD and AFib/ pacemaker, obesity hypoventilation syndrome , CHF,cavitary RUL squamous cell ca, parotid neoplasm    FOLLOWS FOR: Wears BiPAP 11/9 [facility] every night for about 3.5-5 hours; DME is [facility] and no request for DL as of today. Wakes up during the night for restroom breaks or dry mouth issues. Sleep is fragmented by nocturia. Naps with his BIPAP as well. Using Biotene for dry mouth. Patient previously had CPAP- failed in home because he couldn't tolerate the pressure. BIPAP much better tolerated. Followed for his cancer by radiation oncology. CT chest 11/16/14 IMPRESSION: Radiation changes in the right upper lobe. Underlying 9 mm posterior right  upper upper lobe nodule is less conspicuous on the current study. Associated trace right pleural effusion. Small thoracic lymph nodes, grossly unchanged. Two stable pancreatic tail cysts, measuring up to 2.1 cm. Additional ancillary findings as above. Electronically Signed  By: Julian Hy M.D.  On: 11/16/2014 17:07  09/10/2015-82 year old male former smoker followed for OSA, COPD, lung nodules, complicated by CAD, CVA, A. 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 Discussed compliance with BiPAP. I suggested his daughter communicate with his caregivers so they understand and support regular use of BiPAP when he forgets. Handicapped parking CT chest 05/21/2015-ordered per radiation oncology IMPRESSION: Right upper lobe radiation changes, with persistent central nodular density showing no significant change compared to previous study. Stable borderline mediastinal lymphadenopathy. Increased size and attenuation of 1.9 cm nodule in the pancreatic tail. Enlarging pancreatic mass cannot be excluded. Consider abdomen MRI without and with contrast or continued followup by CT. Electronically Signed  By: Earle Gell M.D.  On: 05/21/2015 16:11  ROS-see HPI Constitutional:   No-   weight loss, night sweats, fevers, chills, fatigue, lassitude. HEENT:   No-  headaches, difficulty swallowing, tooth/dental problems, sore throat,       No-  sneezing, itching, ear ache, nasal congestion, post nasal drip,  CV:  No-   chest pain, orthopnea, PND, swelling in lower extremities, anasarca,                                                   dizziness, palpitations Resp: No-   shortness of breath with exertion or at rest.              No-   productive cough,  No  non-productive cough,  No- coughing up of blood.              No-   change in color of mucus.  No- wheezing.   Skin: No-   rash or lesions. GI:  No-   heartburn, indigestion, abdominal pain, nausea, vomiting,  GU:  MS:  No-   joint pain or swelling.   Neuro-     nothing unusual Psych:  No- change in mood or affect. No depression or anxiety.  No memory loss.  Objective:   Physical Exam General- Alert, Oriented, Affect-appropriate, Distress- none acute. + Obese. Skin- rash-none, lesions- none, excoriation- none Lymphadenopathy-  +mass at angle L jaw Head- atraumatic            Eyes- Gross vision intact, PERRLA, conjunctivae clear secretions  Ears- +Hearing aid            Nose- Clear, no-Septal dev, mucus, polyps, erosion, perforation             Throat- Mallampati III-IV , mucosa clear/ not very dry , drainage- none, tonsils- atrophic. Neck- flexible , trachea midline, no stridor , thyroid nl, carotid no bruit Chest - symmetrical excursion , unlabored           Heart/CV- IRR , no murmur , no gallop  , no rub, nl s1 s2                           - JVD +1cm , edema- none, stasis changes- none, varices- none           Lung- + coarse inspiratory wheeze upper lung versus slight stridor,  cough- none , dullness-none, rub- none           Chest wall- +L pacemaker Abd-  Br/ Gen/ Rectal- Not done, not indicated Extrem- cyanosis- none, clubbing, none, atrophy- none, strength- nl Neuro-+ subtle memory lapse, using a walker

## 2015-09-12 NOTE — Assessment & Plan Note (Signed)
Body habitus still consistent with diagnosis.

## 2015-09-12 NOTE — Assessment & Plan Note (Signed)
Discussed potential impact of radiation therapy for his lung cancer Plan-flu vaccine, continue Symbicort and Spiriva

## 2015-09-12 NOTE — Assessment & Plan Note (Signed)
Followed by oncology after XRT

## 2015-09-16 IMAGING — US US PELVIS LIMITED
1 series · 14 of 16 positions shown · non-contrast
Comparison: None.

CLINICAL DATA: Palpable area on the right buttock

EXAM:
LIMITED ULTRASOUND OF PELVIS
TECHNIQUE: Limited transabdominal ultrasound examination of the pelvis was
performed.

[Series 1: us pelvis limited · 0.06mm/px · 16 acquisitions, 14 frames shown]
[im 1/16]
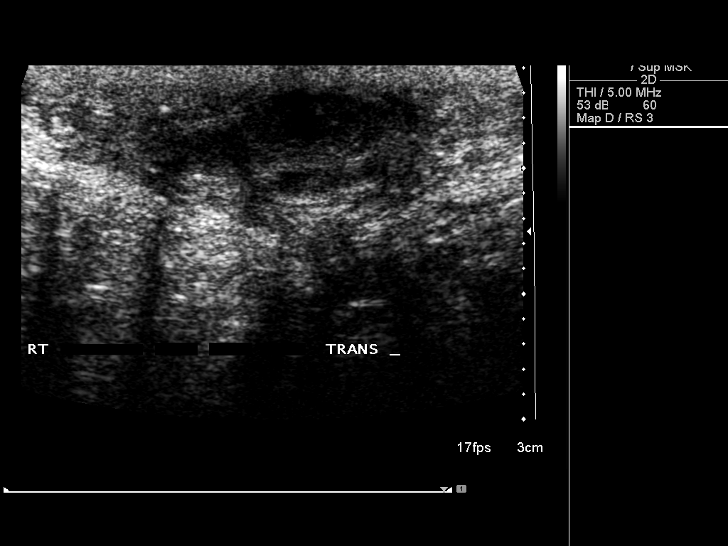
[im 2/16]
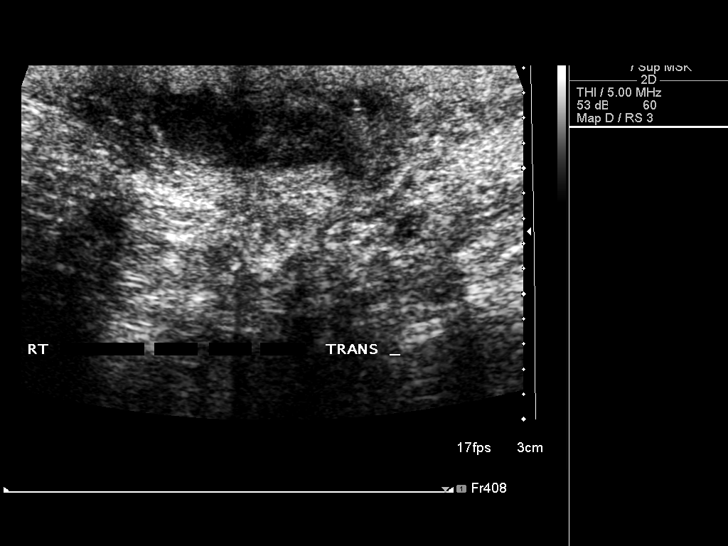
[im 3/16]
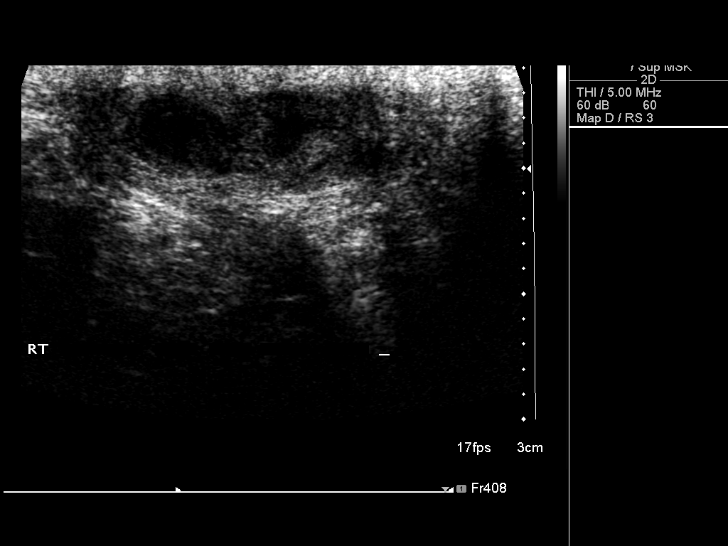
[im 5/16]
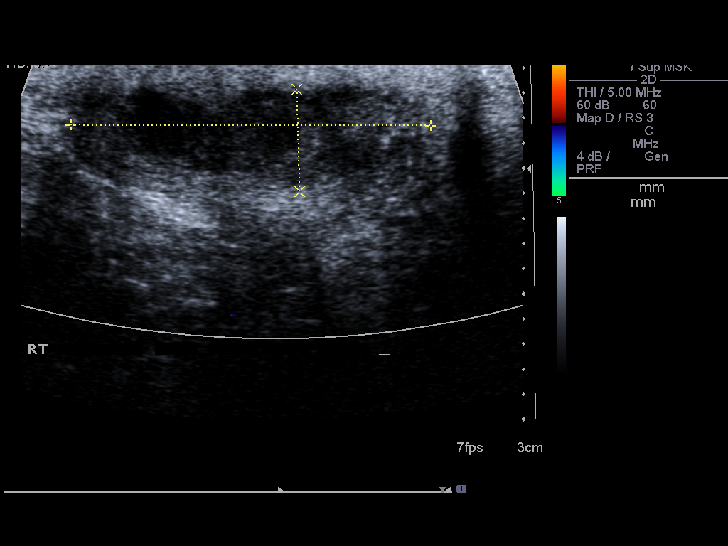
[im 6/16]
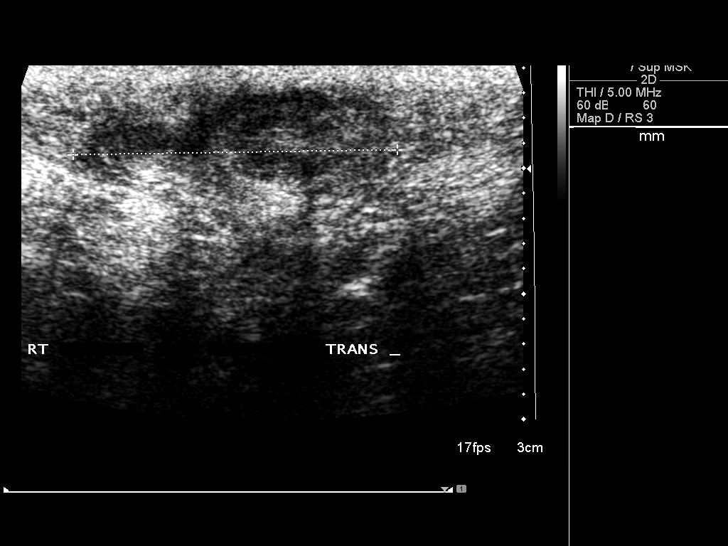
[im 7/16]
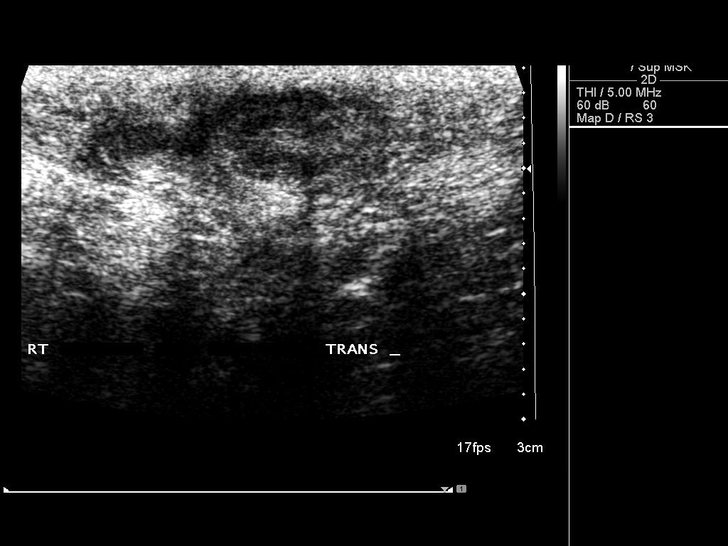
[im 8/16]
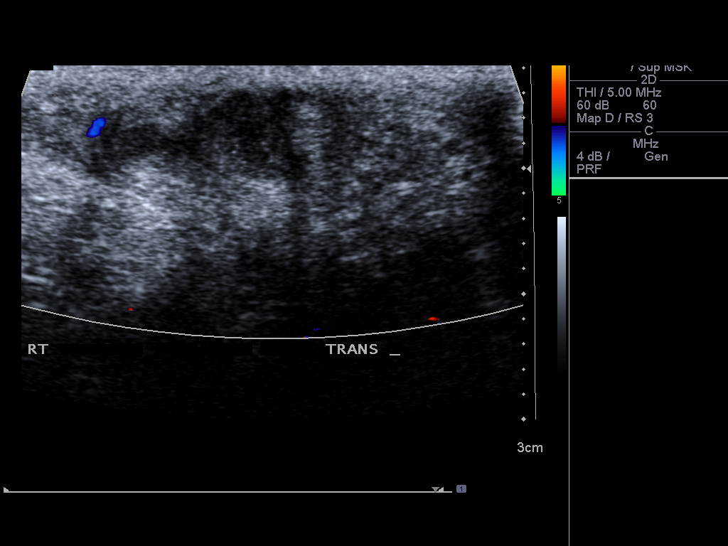
[im 9/16]
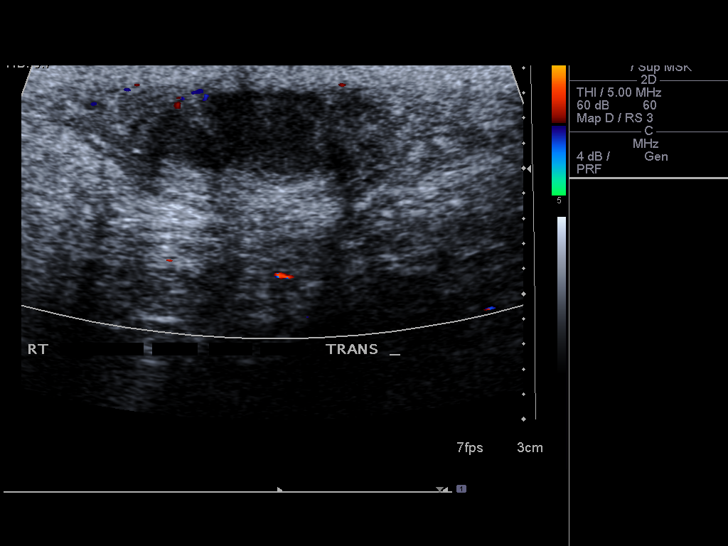
[im 10/16]
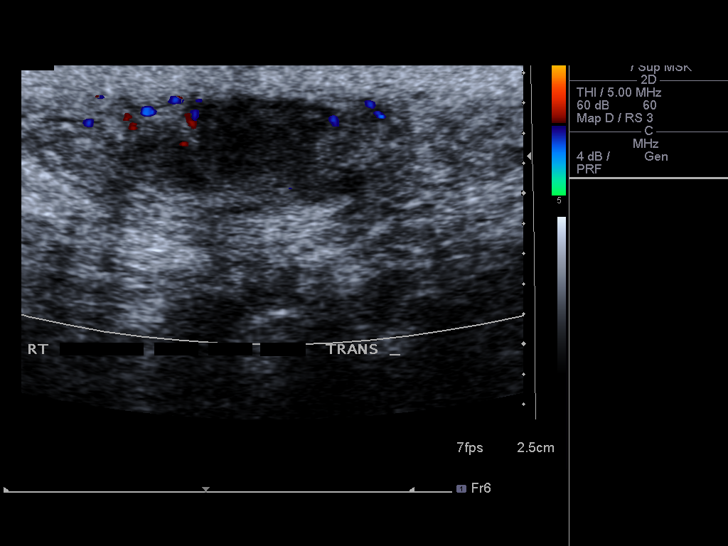
[im 11/16]
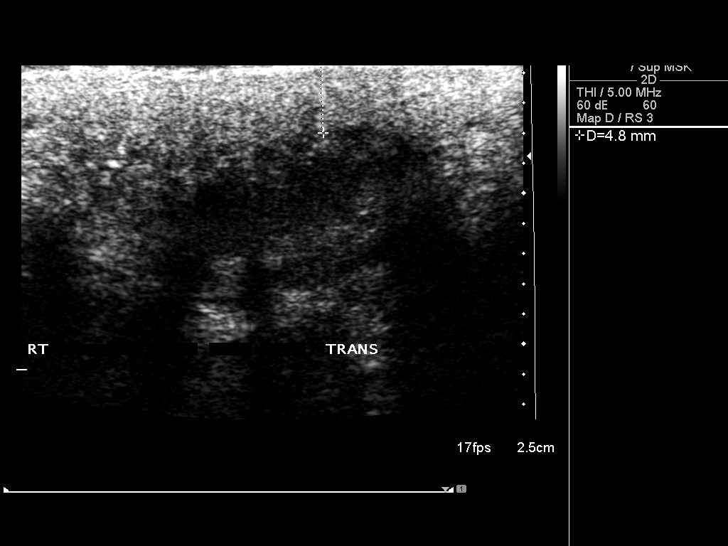
[im 13/16]
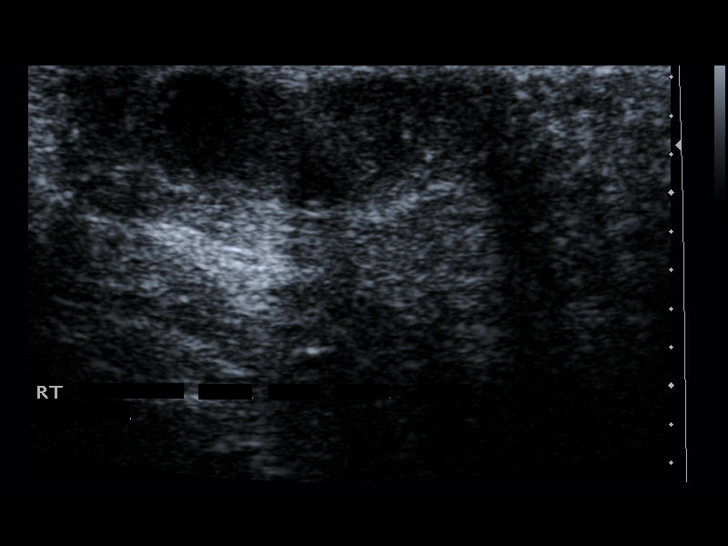
[im 14/16]
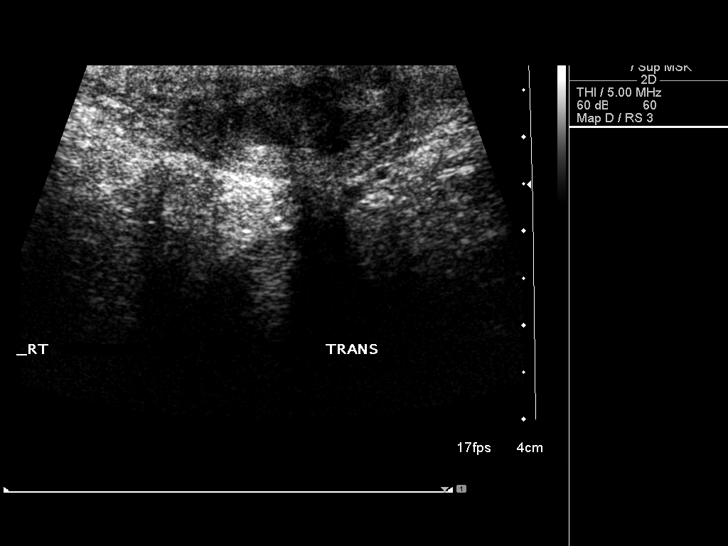
[im 15/16]
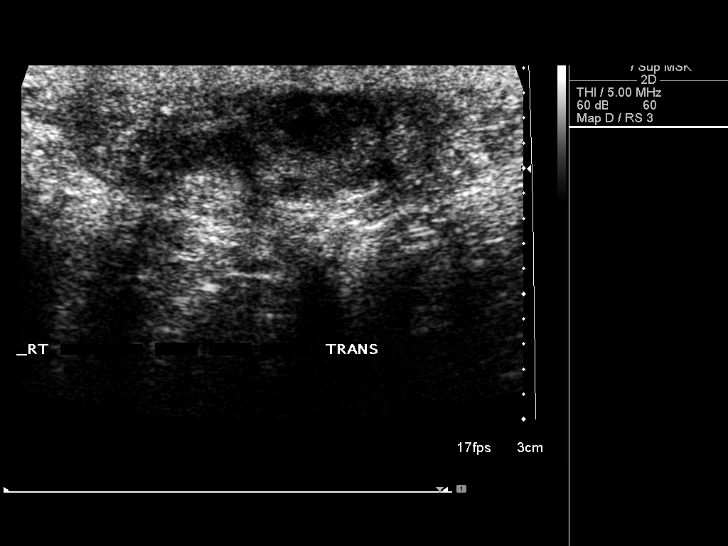
[im 16/16]
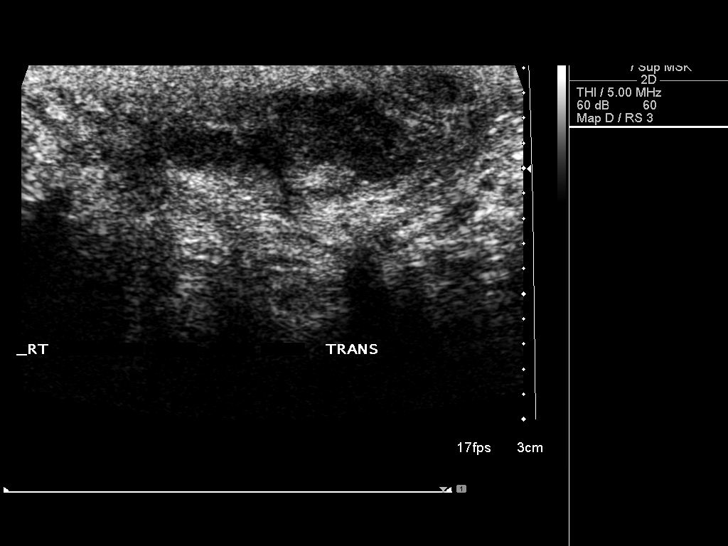

[14 of 16 positions shown; findings below may reference images not displayed]

FINDINGS: Ultrasound over the right buttock was performed. At a depth of
approximately 5 cm, there is an indistinct poorly defined area of
diminished echogenicity with some through transmission. This complex
structure measures 2.9 x 0.8 cm. There are apparent septations
present with a minimal amount of blood flow internally. This could
represent an abscess with hematoma less likely. A soft tissue tumor
cannot be excluded. If this area persists, MRI would be recommended.
IMPRESSION: A hypoechoic structure corresponds to the palpable abnormality in
the right buttocks. There is some septation and internal blood flow.
This may represent abscess, but a soft tissue tumor cannot be
excluded. Continued clinical follow-up is recommended and MRI may be
helpful if further assessment is warranted.

## 2015-09-19 ENCOUNTER — Ambulatory Visit (INDEPENDENT_AMBULATORY_CARE_PROVIDER_SITE_OTHER): Payer: Medicare Other | Admitting: *Deleted

## 2015-09-19 DIAGNOSIS — I480 Paroxysmal atrial fibrillation: Secondary | ICD-10-CM

## 2015-09-19 DIAGNOSIS — I4891 Unspecified atrial fibrillation: Secondary | ICD-10-CM

## 2015-09-19 DIAGNOSIS — Z5181 Encounter for therapeutic drug level monitoring: Secondary | ICD-10-CM | POA: Diagnosis not present

## 2015-09-19 LAB — POCT INR: INR: 2.2

## 2015-10-21 ENCOUNTER — Other Ambulatory Visit: Payer: Self-pay | Admitting: Cardiology

## 2015-10-22 ENCOUNTER — Other Ambulatory Visit: Payer: Self-pay | Admitting: *Deleted

## 2015-10-22 MED ORDER — WARFARIN SODIUM 2.5 MG PO TABS: ORAL_TABLET | ORAL | Status: AC

## 2015-11-02 ENCOUNTER — Ambulatory Visit (INDEPENDENT_AMBULATORY_CARE_PROVIDER_SITE_OTHER): Payer: Medicare Other | Admitting: *Deleted

## 2015-11-02 DIAGNOSIS — Z5181 Encounter for therapeutic drug level monitoring: Secondary | ICD-10-CM | POA: Diagnosis not present

## 2015-11-02 DIAGNOSIS — I4891 Unspecified atrial fibrillation: Secondary | ICD-10-CM | POA: Diagnosis not present

## 2015-11-02 DIAGNOSIS — I480 Paroxysmal atrial fibrillation: Secondary | ICD-10-CM | POA: Diagnosis not present

## 2015-11-02 LAB — POCT INR: INR: 3.1

## 2015-11-14 ENCOUNTER — Ambulatory Visit: Payer: 59 | Admitting: Podiatry

## 2015-11-14 ENCOUNTER — Encounter: Payer: Medicare Other | Admitting: *Deleted

## 2015-11-14 ENCOUNTER — Telehealth: Payer: Self-pay | Admitting: Cardiology

## 2015-11-14 NOTE — Telephone Encounter (Signed)
Attempted to confirm remote transmission with pt. No answer and was unable to leave a message.   

## 2015-11-16 ENCOUNTER — Encounter: Payer: Self-pay | Admitting: Cardiology

## 2015-11-21 ENCOUNTER — Ambulatory Visit (INDEPENDENT_AMBULATORY_CARE_PROVIDER_SITE_OTHER): Payer: Medicare Other | Admitting: Podiatry

## 2015-11-21 ENCOUNTER — Encounter: Payer: Self-pay | Admitting: Podiatry

## 2015-11-21 DIAGNOSIS — B351 Tinea unguium: Secondary | ICD-10-CM

## 2015-11-21 DIAGNOSIS — M79676 Pain in unspecified toe(s): Secondary | ICD-10-CM

## 2015-11-21 NOTE — Progress Notes (Signed)
Patient ID: [name] Gill, male   DOB: 1926-01-05, 82 y.o.   MRN: [id number]  Subjective: This patient presents for ongoing care complaining of thick and elongated toenails which are cough walking wearing shoes request nail debridement. Patient's caregiver is present in treatment room  Objective: Hard of hearing patient presents orientated 3 No open skin lesions bilaterally The toenails are elongated, brittle, discolored, incurvated, deformed and tender to direct palpation 6-10  Assessment: No open skin lesions bilaterally Symptomatic onychomycoses 6-10 Dry scaling skin bilaterally  Plan: Debrided toenails 6-10 mechanical and electrical with slight leading distal fifth right toe and third left toe daily with topical antibiotic ointment and Band-Aids. Patient advised to remove Band-Aids 1-3 days and continue to apply topical antibiotic ointment until a scab forms   Reappoint 3 months

## 2015-11-21 NOTE — Patient Instructions (Signed)
Removed Band-Aid on third left toe and fifth right toe in 1-3 days and apply topical antibiotic ointment daily until a scab forms

## 2015-11-26 ENCOUNTER — Other Ambulatory Visit: Payer: Self-pay | Admitting: Internal Medicine

## 2015-11-28 ENCOUNTER — Ambulatory Visit: Payer: Medicare Other | Admitting: Podiatry

## 2015-11-30 ENCOUNTER — Ambulatory Visit (INDEPENDENT_AMBULATORY_CARE_PROVIDER_SITE_OTHER): Payer: Medicare Other | Admitting: Surgery

## 2015-11-30 DIAGNOSIS — I480 Paroxysmal atrial fibrillation: Secondary | ICD-10-CM | POA: Diagnosis not present

## 2015-11-30 DIAGNOSIS — Z5181 Encounter for therapeutic drug level monitoring: Secondary | ICD-10-CM | POA: Diagnosis not present

## 2015-11-30 DIAGNOSIS — I4891 Unspecified atrial fibrillation: Secondary | ICD-10-CM | POA: Diagnosis not present

## 2015-11-30 LAB — POCT INR: INR: 3.5

## 2015-12-12 ENCOUNTER — Ambulatory Visit (INDEPENDENT_AMBULATORY_CARE_PROVIDER_SITE_OTHER): Payer: Medicare Other | Admitting: *Deleted

## 2015-12-12 ENCOUNTER — Ambulatory Visit (INDEPENDENT_AMBULATORY_CARE_PROVIDER_SITE_OTHER): Payer: Medicare Other | Admitting: Cardiology

## 2015-12-12 VITALS — BP 122/60 | Ht 67.0 in

## 2015-12-12 DIAGNOSIS — I2583 Coronary atherosclerosis due to lipid rich plaque: Principal | ICD-10-CM

## 2015-12-12 DIAGNOSIS — I482 Chronic atrial fibrillation, unspecified: Secondary | ICD-10-CM

## 2015-12-12 DIAGNOSIS — I2510 Atherosclerotic heart disease of native coronary artery without angina pectoris: Secondary | ICD-10-CM

## 2015-12-12 DIAGNOSIS — I480 Paroxysmal atrial fibrillation: Secondary | ICD-10-CM

## 2015-12-12 DIAGNOSIS — I5032 Chronic diastolic (congestive) heart failure: Secondary | ICD-10-CM | POA: Diagnosis not present

## 2015-12-12 DIAGNOSIS — Z5181 Encounter for therapeutic drug level monitoring: Secondary | ICD-10-CM

## 2015-12-12 DIAGNOSIS — E785 Hyperlipidemia, unspecified: Secondary | ICD-10-CM | POA: Diagnosis not present

## 2015-12-12 DIAGNOSIS — I4891 Unspecified atrial fibrillation: Secondary | ICD-10-CM

## 2015-12-12 LAB — POCT INR: INR: 1.6

## 2015-12-12 MED ORDER — FUROSEMIDE 40 MG PO TABS: 40.0000 mg | ORAL_TABLET | Freq: Every day | ORAL | Status: DC

## 2015-12-12 NOTE — Patient Instructions (Signed)
Medication Instructions:  1) START LASIX 40 mg daily at lunch (to take along with your 80 mg twice daily)  Labwork: TODAY: BMET  Testing/Procedures: None  Follow-Up: Your physician recommends that you schedule a follow-up appointment in 1 WEEK with a PA or NP.  Your physician wants you to follow-up in: 6 months with a PA or NP. You will receive a reminder letter in the mail two months in advance. If you don't receive a letter, please call our office to schedule the follow-up appointment.   Your physician wants you to follow-up in: 12 months with Dr. Radford Pax. You will receive a reminder letter in the mail two months in advance. If you don't receive a letter, please call our office to schedule the follow-up appointment.   Any Other Special Instructions Will Be Listed Below (If Applicable).     If you need a refill on your cardiac medications before your next appointment, please call your pharmacy.

## 2015-12-12 NOTE — Progress Notes (Signed)
Cardiology Office Note    Date:  12/12/2015   ID:  [name] Gill, DOB 1925-10-22, MRN [id number]  PCP:  Mathews Argyle, MD  Cardiologist:  Fransico Him, MD   Chief Complaint  Patient presents with  . Coronary Artery Disease  . Hypertension  . Atrial Fibrillation    History of Present Illness:  [name] Gill is a 82 y.o. male with a history of ASCAD, s/p CABG, dyslipidemia, HTN, chronic atrial fibrillation, chronic systemic anticoagulation and tachybrady syndrome s/p PPM who presents for followup. He had a scan done of his neck and chest for a parotid gland mass and the CT showed a spot on his lung and had a PET scan done and then lung biopsy by Dr. Servando Snare and was diagnosed with Stage I lung cancer and had a resection. He underwent XRT and is doing well. He denies any chest pain, SOB, DOE, palpitations, dizziness or syncope. He occasionally has some LE edema.     Past Medical History  Diagnosis Date  . Atrial fibrillation (HCC)     coumadin, pacemaker/ CHRONIC  . Diverticulitis 1975  . Hypertension   . COPD (chronic obstructive pulmonary disease) (HCC)     FEV1 1.35/58% 01-11-10  . Pacemaker -Medtronic     Medtronic  . Ventricular tachycardia, non-sustained (HCC)     Detected on the device  . Hyperlipidemia     goal LDL less than 70  . PVD (peripheral vascular disease) ([place])     w mod left external carotid artery stenosis.   . Obesity   . Ichthyosis   . Renal insufficiency     stage III   . Hyperglycemia     mild glucose 111 on 11/2010  . H/O echocardiogram     nl LVF w severe ASSH and mod cLVH, mildly dilated aorta, milf LAE, mild MR/TR and moderate AVSC  . Incisional hernia     2 upper and 1 lower abdominal incisional hernia.   . Low testosterone     start androgel  . Tachycardia-bradycardia syndrome ([place])     s/p PPM  . Chronic anticoagulation   . CHD (coronary heart disease)     s/p CABG with patents grafts at cath 2009  . Aortic root dilatation  ([place])   . CHF (congestive heart failure) ([place])   . Obesity   . Cataract      no surgery  . GERD (gastroesophageal reflux disease)   . Dysrhythmia     afib  . OSA (obstructive sleep apnea)     04-04-08 NPSG AHI 14.5, 3L/M suppl O2 for nadir 86%  . H/O hiatal hernia   . Arthritis   . Anemia     hx  . Malignant neoplasm of lower lobe, bronchus, or lung 11/22/2013  . Radiation 5/5,57/,5/11,5/13,5/152015    right upper lobe 60 Gy in 5 fractions  . Memory loss     Past Surgical History  Procedure Laterality Date  . Broken nose    . Pacemaker insertion  02/2008    medtronic  . Coronary artery bypass graft  9357,0177    coronary disease s/p CABG 2004 and Cath 2009  . Cardiac catheterization  2009  . Pacemaker insertion  11/14  . Colon surgery  85    diverticulitis  . Back surgery      herniated disc  . Hernia repair    . Parotidectomy Left 12/15/2013    WITH NECK DISECTION  . Parotidectomy Left 12/15/2013    Procedure: SUPEFICIAL  PAROTIDECTOMY WITH FACIAL NERVE DISSECTION;  Surgeon: [name], MD;  Location: [facility];  Service: ENT;  Laterality: Left;  . Permanent pacemaker generator change N/A 06/01/2013    Procedure: PERMANENT PACEMAKER GENERATOR CHANGE;  Surgeon: [name], MD;  Location[facility] CATH LAB;  Service: Cardiovascular;  Laterality: N/A;    Current Medications: Outpatient Prescriptions Prior to Visit  Medication Sig Dispense Refill  . beta carotene w/minerals (OCUVITE) tablet Take 2 tablets by mouth daily.     . Bisacodyl (DULCOLAX PO) Take 1 tablet by mouth daily.    . fenofibrate 54 MG tablet Take 54 mg by mouth Daily.     . ferrous sulfate 325 (65 FE) MG tablet Take 325 mg by mouth daily with breakfast.    . furosemide (LASIX) 80 MG tablet Take 1 tablet (80 mg total) by mouth 2 (two) times daily. 180 tablet 3  . memantine (NAMENDA) 10 MG tablet Take 1 tablet (10 mg total) by mouth 2 (two) times daily. 60 tablet 11  . NITROSTAT 0.4 MG SL tablet Place 0.4  mg under the tongue every 5 (five) minutes as needed for chest pain. Take as directed    . pantoprazole (PROTONIX) 40 MG tablet Take 40 mg by mouth daily.     . potassium chloride (KLOR-CON 10) 10 MEQ CR tablet Take 10 mEq by mouth daily.      [name] SPIRIVA HANDIHALER 18 MCG inhalation capsule INHALE CONTENTS OF 1 CAPSULE BY MOUTH DAILY AS DIRECTED 90 capsule 1  . SYMBICORT 160-4.5 MCG/ACT inhaler INHALE 2 PUFFS INTO THE LUNGS 2 TIMES DAILY 10.2 g 5  . warfarin (COUMADIN) 2.5 MG tablet TAKE AS DIRECTED BY THE COUMADIN CLINIC *THIS IS A 90 DAYS SUPPLY* 180 tablet 3  . CARTIA XT 180 MG 24 hr capsule TAKE 1 CAPSULE BY MOUTH DAILY 30 capsule 4  . CARTIA XT 180 MG 24 hr capsule TAKE 1 CAPSULE BY MOUTH DAILY (Patient not taking: Reported on 12/12/2015) 30 capsule 1   No facility-administered medications prior to visit.     Allergies:   Review of patient's allergies indicates no known allergies.   Social History   Social History  . Marital Status: Married    Spouse Name: N/A  . Number of Children: 4  . Years of Education: College   Occupational History  . retired     [employer] attorney   Social History Main Topics  . Smoking status: Former Smoker -- 2.00 packs/day for 55 years    Types: Cigarettes    Quit date: 10/19/1997  . Smokeless tobacco: Never Used  . Alcohol Use: 0.0 oz/week    0 Standard drinks or equivalent per week     Comment: One glass of wine daily.  . Drug Use: No  . Sexual Activity: Not on file   Other Topics Concern  . Not on file   Social History Narrative   Lives at home with wife.   Right-handed.   1-2 cups coffee per day.     Family History:  The patient's family history includes Aortic stenosis in his mother; Cancer in his father; Tuberculosis in his father.   ROS:   Please see the history of present illness.    ROS All other systems reviewed and are negative.   PHYSICAL EXAM:   VS:  BP 122/60 mmHg  Ht '5\' 7"'$  (1.702 m)   GEN: Well nourished,  well developed, in no acute distress HEENT: normal Neck: no JVD,  carotid bruits, or masses Cardiac: RRR; no murmurs, rubs, or gallops,no edema.  Intact distal pulses bilaterally.  Respiratory:  clear to auscultation bilaterally, normal work of breathing GI: soft, nontender, nondistended, + BS MS: no deformity or atrophy Skin: warm and dry, no rash Neuro:  Alert and Oriented x 3, Strength and sensation are intact Psych: euthymic mood, full affect  Wt Readings from Last 3 Encounters:  09/10/15 189 lb 6.4 oz (85.911 kg)  08/15/15 196 lb 9.6 oz (89.177 kg)  07/31/15 204 lb (92.534 kg)      Studies/Labs Reviewed:   EKG:  EKG is  ordered today and showed atrial fibrillation with V Paced rhythm  Recent Labs: 05/31/2015: ALT 17; BUN 23*; Creatinine, Ser 1.20; Hemoglobin 11.0*; Platelets 231; Potassium 3.7; Sodium 142   Lipid Panel    Component Value Date/Time   CHOL 103 04/09/2015 1006   TRIG 103.0 04/09/2015 1006   HDL 25.80* 04/09/2015 1006   CHOLHDL 4 04/09/2015 1006   VLDL 20.6 04/09/2015 1006   LDLCALC 56 04/09/2015 1006    Additional studies/ records that were reviewed today include:  none    ASSESSMENT:    1. Coronary artery disease due to lipid rich plaque   2. Chronic diastolic heart failure ([place])   3. Chronic atrial fibrillation ([place])   4. Dyslipidemia      PLAN:  In order of problems listed above:  1. ASCAD with no angina - not on ASA due to warfarin.   2. Chronic diastolic CHF - he has significant LE edema - continue CCB.  I have cautioned him about too much sodium in his diet.  I have instructed him to avoid any added salt in his diet.  I will increase his Lasix to '80mg'$  BID and '40mg'$  at lunch.   3. Chronic atrial fibrillation rate controlled - rate controlled on CCB.  Continue Cartia and warfarin.   4. Dyslipidemia - LDL goal < 70.  Continue fenofibrate.  Check FLP and ALT.      Medication Adjustments/Labs and Tests Ordered: Current medicines are  reviewed at length with the patient today.  Concerns regarding medicines are outlined above.  Medication changes, Labs and Tests ordered today are listed in the Patient Instructions below.  There are no Patient Instructions on file for this visit.   Signed, Fransico Him, MD  12/12/2015 4:04 PM    [facility] [place], [place], [place]  [address] Phone: [phone]; Fax: [phone]

## 2015-12-13 LAB — BASIC METABOLIC PANEL
BUN: 33 mg/dL — AB (ref 7–25)
CHLORIDE: 99 mmol/L (ref 98–110)
CO2: 29 mmol/L (ref 20–31)
Calcium: 9.2 mg/dL (ref 8.6–10.3)
Creat: 1.6 mg/dL — ABNORMAL HIGH (ref 0.70–1.11)
Glucose, Bld: 89 mg/dL (ref 65–99)
POTASSIUM: 4 mmol/L (ref 3.5–5.3)
Sodium: 144 mmol/L (ref 135–146)

## 2015-12-14 ENCOUNTER — Telehealth: Payer: Self-pay

## 2015-12-14 ENCOUNTER — Other Ambulatory Visit: Payer: Self-pay | Admitting: General Surgery

## 2015-12-14 DIAGNOSIS — D171 Benign lipomatous neoplasm of skin and subcutaneous tissue of trunk: Secondary | ICD-10-CM

## 2015-12-14 NOTE — Telephone Encounter (Signed)
-----   Message from Sueanne Margarita, MD sent at 12/13/2015  7:47 PM EDT ----- Creatinine increased - please find out if patient has had any improvement in his LE edema after increasing dose of Lasix

## 2015-12-14 NOTE — Telephone Encounter (Signed)
Pt results given to daughter, okay per DPR, stated that today is just 2nd day of taking the extra 40 mg at lunch time. Pt daughter has not seen him today but will call our office on Tuesday with an update on swelling, in case changes needs to be made before Thursday, appointment.

## 2015-12-20 ENCOUNTER — Encounter: Payer: Self-pay | Admitting: Physician Assistant

## 2015-12-20 ENCOUNTER — Ambulatory Visit (INDEPENDENT_AMBULATORY_CARE_PROVIDER_SITE_OTHER): Payer: Medicare Other | Admitting: Physician Assistant

## 2015-12-20 VITALS — BP 124/68 | HR 74 | Ht 67.0 in | Wt 195.0 lb

## 2015-12-20 DIAGNOSIS — I25810 Atherosclerosis of coronary artery bypass graft(s) without angina pectoris: Secondary | ICD-10-CM

## 2015-12-20 DIAGNOSIS — I2510 Atherosclerotic heart disease of native coronary artery without angina pectoris: Secondary | ICD-10-CM | POA: Diagnosis not present

## 2015-12-20 DIAGNOSIS — E785 Hyperlipidemia, unspecified: Secondary | ICD-10-CM

## 2015-12-20 DIAGNOSIS — I495 Sick sinus syndrome: Secondary | ICD-10-CM

## 2015-12-20 DIAGNOSIS — Z950 Presence of cardiac pacemaker: Secondary | ICD-10-CM

## 2015-12-20 DIAGNOSIS — I482 Chronic atrial fibrillation, unspecified: Secondary | ICD-10-CM

## 2015-12-20 NOTE — Progress Notes (Signed)
CARDIOLOGY OFFICE NOTE  Date:  12/20/2015    [name] Date of Birth: [birth year] Medical Record #[id number]  PCP:  Mathews Argyle, MD  Cardiologist:  Dr. Radford Pax  Chief Complaint  Patient presents with  . Follow-up    seen for Dr. Radford Pax    History of Present Illness: Kyrese Gartman is a 82 y.o. male who presents today for for cardiology follow-up. He has PMH of CAD s/p CABG, HTN, HLD, COPD, PVD, chronic atrial fibrillation on Coumadin, and history of tachybradycardia syndrome s/p medtronic PPM. He had CT scan of his neck and chest for parotid gland mass, CT showed a spot in the lung. A PET scan was done in the lung biopsy by Dr. Jobie Quaker showed stage I lung cancer. He underwent resection in 11/2013. He also underwent radiation therapy. His last echocardiogram obtained on 08/15/2015 showed EF 60-65%, severe focal basal septal thickening without LVOT obstruction at rest, no RWMA, severe aortic calcification with mild AS, moderate MR, moderately dilated left atrium. He was last seen by Dr. Radford Pax on 12/12/2015, at which time he was noted to have significant lower extremity edema, his Lasix was increased to 80 mg twice a day with 40 mg additional dose at lunch. Repeat lab however shows his creatinine worsened from 1.2 to 1.6.  He presents today for follow-up accompanied by his grandson, his daughter who manages his medication is not here today. According to his grandson, his lower extremity edema is not worsening, may be slightly improved. On physical exam he continued to have very significant edema in bilateral lower extremity. It appears to be worse on the left side. He is lung is clear. O2 saturation obtained in the office was 97%. That is the only reason I did not send him to the hospital today. But he appears to be massively fluid overloaded. I will obtain a basic metabolic panel today, if creatinine is stable, I will start him on once weekly dose of metolazone. He is currently  taking a total 200 mg Lasix per day. If metolazone does not improve his urine output, we need to consider transition him to [facility]. Given the significant level of acute on chronic diastolic heart failure, I think it is reasonable for him to be closely managed by the heart failure service.     Past Medical History  Diagnosis Date  . Atrial fibrillation (HCC)     coumadin, pacemaker/ CHRONIC  . Diverticulitis 1975  . Hypertension   . COPD (chronic obstructive pulmonary disease) (HCC)     FEV1 1.35/58% 01-11-10  . Pacemaker -Medtronic     Medtronic  . Ventricular tachycardia, non-sustained (HCC)     Detected on the device  . Hyperlipidemia     goal LDL less than 70  . PVD (peripheral vascular disease) ([place])     w mod left external carotid artery stenosis.   . Obesity   . Ichthyosis   . Renal insufficiency     stage III   . Hyperglycemia     mild glucose 111 on 11/2010  . H/O echocardiogram     nl LVF w severe ASSH and mod cLVH, mildly dilated aorta, milf LAE, mild MR/TR and moderate AVSC  . Incisional hernia     2 upper and 1 lower abdominal incisional hernia.   . Low testosterone     start androgel  . Tachycardia-bradycardia syndrome ([place])     s/p PPM  . Chronic anticoagulation   . CHD (coronary heart disease)  s/p CABG with patents grafts at cath 2009  . Aortic root dilatation ([place])   . CHF (congestive heart failure) ([place])   . Obesity   . Cataract      no surgery  . GERD (gastroesophageal reflux disease)   . Dysrhythmia     afib  . OSA (obstructive sleep apnea)     04-04-08 NPSG AHI 14.5, 3L/M suppl O2 for nadir 86%  . H/O hiatal hernia   . Arthritis   . Anemia     hx  . Malignant neoplasm of lower lobe, bronchus, or lung 11/22/2013  . Radiation 5/5,57/,5/11,5/13,5/152015    right upper lobe 60 Gy in 5 fractions  . Memory loss     Past Surgical History  Procedure Laterality Date  . Broken nose    . Pacemaker insertion  02/2008    medtronic  . Coronary  artery bypass graft  3474,2595    coronary disease s/p CABG 2004 and Cath 2009  . Cardiac catheterization  2009  . Pacemaker insertion  11/14  . Colon surgery  85    diverticulitis  . Back surgery      herniated disc  . Hernia repair    . Parotidectomy Left 12/15/2013    WITH NECK DISECTION  . Parotidectomy Left 12/15/2013    Procedure: SUPEFICIAL PAROTIDECTOMY WITH FACIAL NERVE DISSECTION;  Surgeon: [name], MD;  Location: [facility];  Service: ENT;  Laterality: Left;  . Permanent pacemaker generator change N/A 06/01/2013    Procedure: PERMANENT PACEMAKER GENERATOR CHANGE;  Surgeon: [name], MD;  Location: [facility] CATH LAB;  Service: Cardiovascular;  Laterality: N/A;     Medications: Current Outpatient Prescriptions  Medication Sig Dispense Refill  . beta carotene w/minerals (OCUVITE) tablet Take 2 tablets by mouth daily.     . Bisacodyl (DULCOLAX PO) Take 1 tablet by mouth daily.    [name] diltiazem (CARTIA XT) 180 MG 24 hr capsule Take 180 mg by mouth daily.    . fenofibrate 54 MG tablet Take 54 mg by mouth Daily.     . ferrous sulfate 325 (65 FE) MG tablet Take 325 mg by mouth daily with breakfast.    . furosemide (LASIX) 80 MG tablet Take 1 tablet (80 mg total) by mouth 2 (two) times daily. 180 tablet 3  . memantine (NAMENDA) 10 MG tablet Take 1 tablet (10 mg total) by mouth 2 (two) times daily. 60 tablet 11  . NITROSTAT 0.4 MG SL tablet Place 0.4 mg under the tongue every 5 (five) minutes as needed for chest pain. Take as directed    . pantoprazole (PROTONIX) 40 MG tablet Take 40 mg by mouth daily.     . potassium chloride (KLOR-CON 10) 10 MEQ CR tablet Take 10 mEq by mouth daily.      [name] SPIRIVA HANDIHALER 18 MCG inhalation capsule INHALE CONTENTS OF 1 CAPSULE BY MOUTH DAILY AS DIRECTED 90 capsule 1  . SYMBICORT 160-4.5 MCG/ACT inhaler INHALE 2 PUFFS INTO THE LUNGS 2 TIMES DAILY 10.2 g 5  . warfarin (COUMADIN) 2.5 MG tablet TAKE AS DIRECTED BY THE COUMADIN CLINIC *THIS IS A  90 DAYS SUPPLY* 180 tablet 3   No current facility-administered medications for this visit.    Allergies: No Known Allergies  Social History: The patient  reports that he quit smoking about 18 years ago. His smoking use included Cigarettes. He has a 110 pack-year smoking history. He has never used smokeless tobacco. He reports that he drinks alcohol.  He reports that he does not use illicit drugs.   Family History: The patient's family history includes Aortic stenosis in his mother; Cancer in his father; Tuberculosis in his father.   Review of Systems: Please see the history of present illness.   Otherwise, the review of systems is positive for LE edema.   All other systems are reviewed and negative.   Physical Exam: VS:  BP 124/68 mmHg  Pulse 74  Ht '5\' 7"'$  (1.702 m)  Wt 195 lb (88.451 kg)  BMI 30.53 kg/m2 .  BMI Body mass index is 30.53 kg/(m^2).  Wt Readings from Last 3 Encounters:  12/20/15 195 lb (88.451 kg)  09/10/15 189 lb 6.4 oz (85.911 kg)  08/15/15 196 lb 9.6 oz (89.177 kg)    General: Pleasant. Well developed, well nourished and in no acute distress.  HEENT: Normal. Neck: Supple, no JVD, carotid bruits, or masses noted.  Cardiac: Regular rate and rhythm. No murmurs, rubs, or gallops. 3+ pitting edema on the R leg, 3-4+ pitting edema on the L leg Respiratory:  Lungs are clear to auscultation bilaterally with normal work of breathing.  GI: Soft and nontender.  MS: No deformity or atrophy. Gait and ROM intact. Skin: Warm and dry. Color is normal.  Neuro:  Strength and sensation are intact and no gross focal deficits noted.  Psych: Alert, appropriate and with normal affect.   LABORATORY DATA:  EKG:  EKG is ordered today. This demonstrates Ventricularly paced rhythm with wide QRS  Lab Results  Component Value Date   WBC 8.7 05/31/2015   HGB 11.0* 05/31/2015   HCT 34.4* 05/31/2015   PLT 231 05/31/2015   GLUCOSE 89 12/12/2015   CHOL 103 04/09/2015   TRIG 103.0  04/09/2015   HDL 25.80* 04/09/2015   LDLCALC 56 04/09/2015   ALT 17 05/31/2015   AST 24 05/31/2015   NA 144 12/12/2015   K 4.0 12/12/2015   CL 99 12/12/2015   CREATININE 1.60* 12/12/2015   BUN 33* 12/12/2015   CO2 29 12/12/2015   INR 1.6 12/12/2015     Other Studies Reviewed Today:  Echo 08/15/2015 LV EF: 60% - 65%  ------------------------------------------------------------------- Indications: Stroke (I163.9).  ------------------------------------------------------------------- History: PMH: Atrial fibrillation. Coronary artery disease. Congestive heart failure. Risk factors: Obesity, Ventricular Tachycardia, Peripheral Vascular Disease, Obstructive Sleep Apnea, Aortic Root Dilation. Former tobacco use. Hypertension. Dyslipidemia.  ------------------------------------------------------------------- Study Conclusions  - Left ventricle: The cavity size was normal. Wall thickness was  increased in a pattern of mild LVH. There was severe focal basal  septal thickening without LVOT obstruction at rest. Systolic  function was normal. The estimated ejection fraction was in the  range of 60% to 65%. Wall motion was normal; there were no  regional wall motion abnormalities. The study is not technically  sufficient to allow evaluation of LV diastolic function. - Aortic valve: Heavily calcified with restricted leaflet motion.  Mild aortic stenosis. Mean gradient (S): 7 mm Hg. Peak gradient  (S): 16 mm Hg. Valve area (VTI): 1.8 cm^2. Valve area (Vmax):  1.67 cm^2. Valve area (Vmean): 1.55 cm^2. - Mitral valve: Moderate thickening and prolapse of the anterior  leaflet predominantly, with moderate posteriorly directed mitral  regurgitation. - Left atrium: Moderately dilated at 43 ml/m2. - Right ventricle: The cavity size was normal. Pacer wire or  catheter noted in right ventricle. Systolic function is reduced.  Lateral annulus peak S velocity: 8.88  cm/s. - Right atrium: The atrium was mildly dilated. Pacer wire or  catheter noted in right atrium. - Tricuspid valve: There was mild regurgitation. - Pulmonary arteries: PA peak pressure: 32 mm Hg (S). - Inferior vena cava: The vessel was normal in size. The  respirophasic diameter changes were in the normal range (>= 50%),  consistent with normal central venous pressure.  Impressions:  - Compared to a prior study in 2015, the EF is higher with severe  focal basal septal hypertrophy. The aortic valve is calcified,  but only very mildly stenotic. There is moderate MR, with late  anterior prolapse of the mitral leaflets.    Assessment/Plan:  1. Acute on chronic diastolic HF  - continue current regimen, obtain BMET today, if Cr stable, plan to add metolazone. Refer patient to HF clinic to be seen in 1-2 weeks  - last echo in Jan 2017 mentioned normal EF severe focal hypertrophy, but doubt his current symptom is related to that hypertrophy. He has he has been compliant with no salt diet. But Yolanda Bonine says he does not understand anything and is not good at follow instruction. Fortunately, it appears the daughter is managing the medications  - unclear how many lbs overnight baseline, his weight in Jan was 204 lbs, he is now 195 lbs which is actually lower than previous weight. His lowest weight appears to be 189 lbs in Feb   2. CAD s/p CABG: no obvious angina  3. history of tachybradycardia syndrome s/p medtronic PPM: current in paced rhythm  4. chronic atrial fibrillation on Coumadin  - his INR has been labile, but largely therapeutic, last INR was subtherapeutic on 5/24 1.6  5. HTN: currently controlled, BP 124/68   Current medicines are reviewed with the patient today.  The patient does not have concerns regarding medicines other than what has been noted above.  The following changes have been made:  See above.  Labs/ tests ordered today include:    Orders Placed This  Encounter  Procedures  . Basic Metabolic Panel (BMET)  . EKG 12-Lead     Disposition:   FU with HF clinic in 2 weeks.   Patient is agreeable to this plan and will call if any problems develop in the interim.   Signed: Almyra Deforest PA-C 12/20/2015 5:32 PM  [facility] [address] [place], [place]  [address] Phone: [phone] Fax: [phone]

## 2015-12-20 NOTE — Patient Instructions (Addendum)
Medication Instructions:  Your physician recommends that you continue on your current medications as directed. Please refer to the Current Medication list given to you today.   Labwork: TODAY;  BMET  Testing/Procedures: NONE ORDERED  Follow-Up: Your physician recommends that you schedule a follow-up appointment in: 1-2 [place]    Any Other Special Instructions Will Be Listed Below (If Applicable).     If you need a refill on your cardiac medications before your next appointment, please call your pharmacy.

## 2015-12-21 ENCOUNTER — Telehealth: Payer: Self-pay | Admitting: Cardiology

## 2015-12-21 LAB — BASIC METABOLIC PANEL
BUN: 31 mg/dL — ABNORMAL HIGH (ref 7–25)
CALCIUM: 9.7 mg/dL (ref 8.6–10.3)
CO2: 31 mmol/L (ref 20–31)
CREATININE: 1.66 mg/dL — AB (ref 0.70–1.11)
Chloride: 101 mmol/L (ref 98–110)
Glucose, Bld: 87 mg/dL (ref 65–99)
Potassium: 4 mmol/L (ref 3.5–5.3)
Sodium: 144 mmol/L (ref 135–146)

## 2015-12-21 NOTE — Telephone Encounter (Signed)
Patient's daughter called to get an update because she did not come to his OV yesterday. She was unclear about Heart Failure Clinic appointment. Informed her that the HF Clinic will help get him on a medication regimen that will help with his swelling.  Instructed her to call early next week if she is not called with an appointment. She was grateful for call.

## 2015-12-21 NOTE — Telephone Encounter (Signed)
New message     From office visit on 6/1 daughter needs some clarification what to expect regarding heart failure.

## 2015-12-23 ENCOUNTER — Encounter (HOSPITAL_BASED_OUTPATIENT_CLINIC_OR_DEPARTMENT_OTHER): Payer: Self-pay | Admitting: *Deleted

## 2015-12-23 ENCOUNTER — Emergency Department (HOSPITAL_BASED_OUTPATIENT_CLINIC_OR_DEPARTMENT_OTHER)
Admission: EM | Admit: 2015-12-23 | Discharge: 2015-12-24 | Disposition: A | Discharge status: Home / Self Care | Payer: Medicare Other | Attending: Emergency Medicine | Admitting: Emergency Medicine

## 2015-12-23 DIAGNOSIS — L03116 Cellulitis of left lower limb: Secondary | ICD-10-CM | POA: Insufficient documentation

## 2015-12-23 DIAGNOSIS — I509 Heart failure, unspecified: Secondary | ICD-10-CM | POA: Insufficient documentation

## 2015-12-23 DIAGNOSIS — Z79899 Other long term (current) drug therapy: Secondary | ICD-10-CM | POA: Diagnosis not present

## 2015-12-23 DIAGNOSIS — I110 Hypertensive heart disease with heart failure: Secondary | ICD-10-CM | POA: Insufficient documentation

## 2015-12-23 DIAGNOSIS — Z6827 Body mass index (BMI) 27.0-27.9, adult: Secondary | ICD-10-CM | POA: Insufficient documentation

## 2015-12-23 DIAGNOSIS — M1990 Unspecified osteoarthritis, unspecified site: Secondary | ICD-10-CM | POA: Insufficient documentation

## 2015-12-23 DIAGNOSIS — E669 Obesity, unspecified: Secondary | ICD-10-CM | POA: Diagnosis not present

## 2015-12-23 DIAGNOSIS — I2510 Atherosclerotic heart disease of native coronary artery without angina pectoris: Secondary | ICD-10-CM | POA: Insufficient documentation

## 2015-12-23 DIAGNOSIS — I4891 Unspecified atrial fibrillation: Secondary | ICD-10-CM | POA: Diagnosis not present

## 2015-12-23 DIAGNOSIS — E785 Hyperlipidemia, unspecified: Secondary | ICD-10-CM | POA: Diagnosis not present

## 2015-12-23 DIAGNOSIS — Z87891 Personal history of nicotine dependence: Secondary | ICD-10-CM | POA: Insufficient documentation

## 2015-12-23 DIAGNOSIS — M7989 Other specified soft tissue disorders: Secondary | ICD-10-CM | POA: Diagnosis present

## 2015-12-23 DIAGNOSIS — J449 Chronic obstructive pulmonary disease, unspecified: Secondary | ICD-10-CM | POA: Insufficient documentation

## 2015-12-23 LAB — CBC WITH DIFFERENTIAL/PLATELET
BASOS ABS: 0 10*3/uL (ref 0.0–0.1)
BASOS PCT: 1 %
EOS PCT: 2 %
Eosinophils Absolute: 0.1 10*3/uL (ref 0.0–0.7)
HEMATOCRIT: 43.8 % (ref 39.0–52.0)
Hemoglobin: 14.9 g/dL (ref 13.0–17.0)
LYMPHS ABS: 1.1 10*3/uL (ref 0.7–4.0)
Lymphocytes Relative: 17 %
MCH: 33.2 pg (ref 26.0–34.0)
MCHC: 34 g/dL (ref 30.0–36.0)
MCV: 97.6 fL (ref 78.0–100.0)
Monocytes Absolute: 0.8 10*3/uL (ref 0.1–1.0)
Monocytes Relative: 12 %
NEUTROS PCT: 68 %
Neutro Abs: 4.5 10*3/uL (ref 1.7–7.7)
PLATELETS: 207 10*3/uL (ref 150–400)
RBC: 4.49 MIL/uL (ref 4.22–5.81)
RDW: 13.5 % (ref 11.5–15.5)
WBC: 6.6 10*3/uL (ref 4.0–10.5)

## 2015-12-23 LAB — COMPREHENSIVE METABOLIC PANEL
ALK PHOS: 36 U/L — AB (ref 38–126)
ALT: 14 U/L — AB (ref 17–63)
ANION GAP: 8 (ref 5–15)
AST: 20 U/L (ref 15–41)
Albumin: 3.7 g/dL (ref 3.5–5.0)
BUN: 32 mg/dL — ABNORMAL HIGH (ref 6–20)
CALCIUM: 9.3 mg/dL (ref 8.9–10.3)
CO2: 31 mmol/L (ref 22–32)
CREATININE: 1.63 mg/dL — AB (ref 0.61–1.24)
Chloride: 101 mmol/L (ref 101–111)
GFR, EST AFRICAN AMERICAN: 42 mL/min — AB (ref >60)
GFR, EST NON AFRICAN AMERICAN: 36 mL/min — AB (ref >60)
Glucose, Bld: 100 mg/dL — ABNORMAL HIGH (ref 65–99)
Potassium: 3.2 mmol/L — ABNORMAL LOW (ref 3.5–5.1)
SODIUM: 140 mmol/L (ref 135–145)
TOTAL PROTEIN: 7 g/dL (ref 6.5–8.1)
Total Bilirubin: 0.8 mg/dL (ref 0.3–1.2)

## 2015-12-23 LAB — BRAIN NATRIURETIC PEPTIDE: B NATRIURETIC PEPTIDE 5: 288.1 pg/mL — AB (ref 0.0–100.0)

## 2015-12-23 MED ORDER — DOXYCYCLINE HYCLATE 100 MG PO TABS
100.0000 mg | ORAL_TABLET | Freq: Once | ORAL | Status: AC
  Administered 2015-12-23: 100 mg via ORAL
  Filled 2015-12-23: qty 1

## 2015-12-23 NOTE — ED Provider Notes (Signed)
CSN: [id number]     Arrival date & time 12/23/15  1943 History   By signing my name below, I, [name] Gill, attest that this documentation has been prepared under the direction and in the presence of [name], MD . Electronically Signed: [name], Scribe. 12/24/2015. 12:14 AM.   Chief Complaint  Patient presents with  . Leg Swelling   Patient is a 82 y.o. male presenting with rash. The history is provided by the patient and a relative. No language interpreter was used.  Rash Location:  Leg Leg rash location:  L lower leg Quality: redness and weeping   Severity:  Mild Onset quality:  Gradual Timing:  Constant Progression:  Worsening Chronicity:  New Context: not animal contact   Relieved by:  Nothing Worsened by:  Nothing tried Ineffective treatments:  None tried Associated symptoms: no fever and no periorbital edema    HPI Comments:  [name] Gill is a 82 y.o. male with PMHx of HTN, CHF, PVD, renal insufficiency, who presents to the Emergency Department complaining of gradually worsening, "oozing and dripping," wound on left lower leg that began ~1 week ago. Pt's daughter also describes associated symptom of bilateral leg edema and cold feet. Per daughter, pt's caregiver was the first to notice these symptoms and was concerned that pt might have cellulitis. Pt's daughter also states pt was seen at the cardiologist who increased his lasix to '80mg'$ . He has been advised to follow up with heart failure clinic.   Past Medical History  Diagnosis Date  . Atrial fibrillation (HCC)     coumadin, pacemaker/ CHRONIC  . Diverticulitis 1975  . Hypertension   . COPD (chronic obstructive pulmonary disease) (HCC)     FEV1 1.35/58% 01-11-10  . Pacemaker -Medtronic     Medtronic  . Ventricular tachycardia, non-sustained (HCC)     Detected on the device  . Hyperlipidemia     goal LDL less than 70  . PVD (peripheral vascular disease) ([name])     w mod left external carotid artery stenosis.   .  Obesity   . Ichthyosis   . Renal insufficiency     stage III   . Hyperglycemia     mild glucose 111 on 11/2010  . H/O echocardiogram     nl LVF w severe ASSH and mod cLVH, mildly dilated aorta, milf LAE, mild MR/TR and moderate AVSC  . Incisional hernia     2 upper and 1 lower abdominal incisional hernia.   . Low testosterone     start androgel  . Tachycardia-bradycardia syndrome ([name])     s/p PPM  . Chronic anticoagulation   . CHD (coronary heart disease)     s/p CABG with patents grafts at cath 2009  . Aortic root dilatation ([place])   . CHF (congestive heart failure) ([place])   . Obesity   . Cataract      no surgery  . GERD (gastroesophageal reflux disease)   . Dysrhythmia     afib  . OSA (obstructive sleep apnea)     04-04-08 NPSG AHI 14.5, 3L/M suppl O2 for nadir 86%  . H/O hiatal hernia   . Arthritis   . Anemia     hx  . Malignant neoplasm of lower lobe, bronchus, or lung 11/22/2013  . Radiation 5/5,57/,5/11,5/13,5/152015    right upper lobe 60 Gy in 5 fractions  . Memory loss    Past Surgical History  Procedure Laterality Date  . Broken nose    .  Pacemaker insertion  02/2008    medtronic  . Coronary artery bypass graft  5784,6962    coronary disease s/p CABG 2004 and Cath 2009  . Cardiac catheterization  2009  . Pacemaker insertion  11/14  . Colon surgery  87    diverticulitis  . Back surgery      herniated disc  . Hernia repair    . Parotidectomy Left 12/15/2013    WITH NECK DISECTION  . Parotidectomy Left 12/15/2013    Procedure: SUPEFICIAL PAROTIDECTOMY WITH FACIAL NERVE DISSECTION;  Surgeon: [name], MD;  Location: [facility];  Service: ENT;  Laterality: Left;  . Permanent pacemaker generator change N/A 06/01/2013    Procedure: PERMANENT PACEMAKER GENERATOR CHANGE;  Surgeon: [name], MD;  Location: [facility] CATH LAB;  Service: Cardiovascular;  Laterality: N/A;   Family History  Problem Relation Age of Onset  . Tuberculosis Father     arrested  .  Cancer Father   . Aortic stenosis Mother     ASCVD   Social History  Substance Use Topics  . Smoking status: Former Smoker -- 2.00 packs/day for 55 years    Types: Cigarettes    Quit date: 10/19/1997  . Smokeless tobacco: Never Used  . Alcohol Use: 0.0 oz/week    0 Standard drinks or equivalent per week     Comment: One glass of wine daily.    Review of Systems  Constitutional: Negative for fever.  Cardiovascular: Positive for leg swelling.  Skin: Positive for rash and wound.  All other systems reviewed and are negative.     Allergies  Review of patient's allergies indicates no known allergies.  Home Medications   Prior to Admission medications   Medication Sig Start Date End Date Taking? Authorizing Provider  beta carotene w/minerals (OCUVITE) tablet Take 2 tablets by mouth daily.     Historical Provider, MD  Bisacodyl (DULCOLAX PO) Take 1 tablet by mouth daily.    Historical Provider, MD  diltiazem (CARTIA XT) 180 MG 24 hr capsule Take 180 mg by mouth daily.    Historical Provider, MD  fenofibrate 54 MG tablet Take 54 mg by mouth Daily.     Historical Provider, MD  ferrous sulfate 325 (65 FE) MG tablet Take 325 mg by mouth daily with breakfast.    Historical Provider, MD  furosemide (LASIX) 80 MG tablet Take 1 tablet (80 mg total) by mouth 2 (two) times daily. 08/15/15   [name], MD  memantine (NAMENDA) 10 MG tablet Take 1 tablet (10 mg total) by mouth 2 (two) times daily. 07/31/15   [name], MD  NITROSTAT 0.4 MG SL tablet Place 0.4 mg under the tongue every 5 (five) minutes as needed for chest pain. Take as directed    Historical Provider, MD  pantoprazole (PROTONIX) 40 MG tablet Take 40 mg by mouth daily.     Historical Provider, MD  potassium chloride (KLOR-CON 10) 10 MEQ CR tablet Take 10 mEq by mouth daily.      Historical Provider, MD  SPIRIVA HANDIHALER 18 MCG inhalation capsule INHALE CONTENTS OF 1 CAPSULE BY MOUTH DAILY AS DIRECTED 11/26/15   [name],  MD  SYMBICORT 160-4.5 MCG/ACT inhaler INHALE 2 PUFFS INTO THE LUNGS 2 TIMES DAILY 02/14/15   [name], MD  warfarin (COUMADIN) 2.5 MG tablet TAKE AS DIRECTED BY THE COUMADIN CLINIC *THIS IS A 90 DAYS SUPPLY* 10/22/15   Sueanne Margarita, MD   Triage Vitals: BP 130/66  mmHg  Pulse 68  Temp(Src) 97.9 F (36.6 C) (Oral)  Resp 18  Ht '5\' 10"'$  (1.778 m)  Wt 195 lb (88.451 kg)  BMI 27.98 kg/m2  SpO2 98% Physical Exam  Constitutional: He is oriented to person, place, and time. He appears well-developed and well-nourished. No distress.  HENT:  Head: Normocephalic and atraumatic.  Mouth/Throat: Oropharynx is clear and moist. No oropharyngeal exudate.  Moist mucous membranes   Eyes: Conjunctivae are normal. Pupils are equal, round, and reactive to light.  Neck: Normal range of motion. Neck supple. No JVD present.  Trachea midline No bruit  Cardiovascular: Normal rate, regular rhythm and normal heart sounds.   Pulmonary/Chest: Effort normal and breath sounds normal. No stridor. No respiratory distress.  Abdominal: Soft. Bowel sounds are normal. He exhibits no distension. There is no tenderness. There is no rebound and no guarding.  Gassy  Musculoskeletal: Normal range of motion. He exhibits edema. He exhibits no tenderness.  Mild cellulitis of the left lateral shin, venous stasis dermatitis with weeping  Neurological: He is alert and oriented to person, place, and time. He has normal reflexes.  Skin: Skin is warm and dry.  Bilateral legs: 2in area with mild warmth and erythema  More consistent with stasis dermatitis   Psychiatric: He has a normal mood and affect. His behavior is normal.  Nursing note and vitals reviewed.   ED Course  Procedures  DIAGNOSTIC STUDIES:  Oxygen Saturation is 98% on RA, normal by my interpretation.    COORDINATION OF CARE:  11:09 PM Discussed treatment Gill, which includes Doxycycline with pt at bedside and pt agreed to Gill.  Labs Review Labs Reviewed   COMPREHENSIVE METABOLIC PANEL - Abnormal; Notable for the following:    Potassium 3.2 (*)    Glucose, Bld 100 (*)    BUN 32 (*)    Creatinine, Ser 1.63 (*)    ALT 14 (*)    Alkaline Phosphatase 36 (*)    GFR calc non Af Amer 36 (*)    GFR calc Af Amer 42 (*)    All other components within normal limits  BRAIN NATRIURETIC PEPTIDE - Abnormal; Notable for the following:    B Natriuretic Peptide 288.1 (*)    All other components within normal limits  CBC WITH DIFFERENTIAL/PLATELET    Imaging Review No results found. I have personally reviewed and evaluated these images and lab results as part of my medical decision-making.   EKG Interpretation None      MDM   Final diagnoses:  None    Filed Vitals:   12/24/15 0006 12/24/15 0030  BP: 122/66 117/60  Pulse:    Temp:    Resp:     Results for orders placed or performed during the hospital encounter of 12/23/15  Comprehensive metabolic panel  Result Value Ref Range   Sodium 140 135 - 145 mmol/L   Potassium 3.2 (L) 3.5 - 5.1 mmol/L   Chloride 101 101 - 111 mmol/L   CO2 31 22 - 32 mmol/L   Glucose, Bld 100 (H) 65 - 99 mg/dL   BUN 32 (H) 6 - 20 mg/dL   Creatinine, Ser 1.63 (H) 0.61 - 1.24 mg/dL   Calcium 9.3 8.9 - 10.3 mg/dL   Total Protein 7.0 6.5 - 8.1 g/dL   Albumin 3.7 3.5 - 5.0 g/dL   AST 20 15 - 41 U/L   ALT 14 (L) 17 - 63 U/L   Alkaline Phosphatase 36 (L) 38 - 126 U/L  Total Bilirubin 0.8 0.3 - 1.2 mg/dL   GFR calc non Af Amer 36 (L) >60 mL/min   GFR calc Af Amer 42 (L) >60 mL/min   Anion gap 8 5 - 15  Brain natriuretic peptide  Result Value Ref Range   B Natriuretic Peptide 288.1 (H) 0.0 - 100.0 pg/mL  CBC with Differential  Result Value Ref Range   WBC 6.6 4.0 - 10.5 K/uL   RBC 4.49 4.22 - 5.81 MIL/uL   Hemoglobin 14.9 13.0 - 17.0 g/dL   HCT 43.8 39.0 - 52.0 %   MCV 97.6 78.0 - 100.0 fL   MCH 33.2 26.0 - 34.0 pg   MCHC 34.0 30.0 - 36.0 g/dL   RDW 13.5 11.5 - 15.5 %   Platelets 207 150 - 400 K/uL    Neutrophils Relative % 68 %   Neutro Abs 4.5 1.7 - 7.7 K/uL   Lymphocytes Relative 17 %   Lymphs Abs 1.1 0.7 - 4.0 K/uL   Monocytes Relative 12 %   Monocytes Absolute 0.8 0.1 - 1.0 K/uL   Eosinophils Relative 2 %   Eosinophils Absolute 0.1 0.0 - 0.7 K/uL   Basophils Relative 1 %   Basophils Absolute 0.0 0.0 - 0.1 K/uL   No results found.   Medications  doxycycline (VIBRA-TABS) tablet 100 mg (100 mg Oral Given 12/23/15 2330)  potassium chloride SA (K-DUR,KLOR-CON) CR tablet 40 mEq (40 mEq Oral Given 12/24/15 0104)    EDEMA is improving with ambulation will start doxycycline for cellulitis to cover MRSA moreover will not interact with coumadin.   Follow up tomorrow with your PMD strict return precautions given    I personally performed the services described in this documentation, which was scribed in my presence. The recorded information has been reviewed and is accurate.      Veatrice Kells, MD 12/24/15 [phone]

## 2015-12-23 NOTE — ED Notes (Signed)
Home-health care assistant and family at [facility], pt alert, NAD, calm, interactive, speech clear, c/o LLE lateral shin red and weeping, (denies: CP, pain, sob, nausea, fever, dizziness or other sx), bilateral LE are cold, with dependent edema worse than usual. Takes lasix '80mg'$  in am and at night and '40mg'$  in afternoon ('200mg'$  daily) w/o much improvement. "To be seeing a specialist soon".

## 2015-12-23 NOTE — ED Notes (Signed)
Pts family reports that pts left leg has a wound (unsure from what), swelling that has gotten worse since yesterday.

## 2015-12-24 ENCOUNTER — Encounter (HOSPITAL_BASED_OUTPATIENT_CLINIC_OR_DEPARTMENT_OTHER): Payer: Self-pay | Admitting: Emergency Medicine

## 2015-12-24 ENCOUNTER — Telehealth: Payer: Self-pay | Admitting: Cardiology

## 2015-12-24 ENCOUNTER — Telehealth: Payer: Self-pay | Admitting: *Deleted

## 2015-12-24 DIAGNOSIS — L03116 Cellulitis of left lower limb: Secondary | ICD-10-CM | POA: Diagnosis not present

## 2015-12-24 MED ORDER — DOXYCYCLINE HYCLATE 100 MG PO CAPS: 100.0000 mg | ORAL_CAPSULE | Freq: Two times a day (BID) | ORAL | Status: AC

## 2015-12-24 MED ORDER — POTASSIUM CHLORIDE CRYS ER 20 MEQ PO TBCR
40.0000 meq | EXTENDED_RELEASE_TABLET | Freq: Once | ORAL | Status: AC
  Administered 2015-12-24: 40 meq via ORAL
  Filled 2015-12-24: qty 2

## 2015-12-24 NOTE — Telephone Encounter (Signed)
Pt sch to see Dr Haroldine Laws 6/15, our 1st available (work-in) slot for a new pt

## 2015-12-24 NOTE — Telephone Encounter (Signed)
Pharmacy Pharm D called related to Rx: doxycycline (VIBRAMYCIN) 100 MG capsule interaction with coumadin.  [facility] asked for suggestion as most abx interact with coumadin; Pharm D had none.  EDP who prescribed ([place]) placed note stating pt should follow up with his PCP.

## 2015-12-24 NOTE — Discharge Instructions (Signed)

## 2015-12-24 NOTE — Telephone Encounter (Signed)
Pt went to [facility] over the weekend-cellulitis was diagnosed-daughter [name] calling to see if we can get him a sooner appt with CHF clinic -was told on Friday he would be called for an appt but she feels it's more urgent now because he can't tolerate anymore Lasix -I didn't see a referral in epic  pls call  [phone]

## 2015-12-26 ENCOUNTER — Telehealth: Payer: Self-pay | Admitting: *Deleted

## 2015-12-26 MED ORDER — METOLAZONE 2.5 MG PO TABS: 2.5000 mg | ORAL_TABLET | ORAL | Status: AC

## 2015-12-26 NOTE — Telephone Encounter (Signed)
-----   Message from [place], [place] sent at 12/21/2015 12:48 PM EDT ----- Kidney function still about the same, will add 2.'5mg'$  metolazone once a week. Remind patient or his daughter (who manages his medication) that metolazone is harsh on the kidney and should not take every day. We will start at lowest dose and take once a week to decrease potential harm to the kidney. Plan is still for him to followup in heart failure clinic.

## 2015-12-26 NOTE — Telephone Encounter (Signed)
Spoke with pt's daughter, [name], [place] on file. Advised her of new medication and directions. Daughter verbalized understanding and was in agreement with this plan.

## 2015-12-27 ENCOUNTER — Emergency Department (HOSPITAL_COMMUNITY): Payer: Medicare Other

## 2015-12-27 ENCOUNTER — Telehealth: Payer: Self-pay | Admitting: Cardiology

## 2015-12-27 ENCOUNTER — Telehealth (HOSPITAL_COMMUNITY): Payer: Self-pay | Admitting: *Deleted

## 2015-12-27 ENCOUNTER — Emergency Department (HOSPITAL_COMMUNITY)
Admission: EM | Admit: 2015-12-27 | Discharge: 2016-01-19 | Disposition: E | Payer: Medicare Other | Attending: Emergency Medicine | Admitting: Emergency Medicine

## 2015-12-27 ENCOUNTER — Encounter (HOSPITAL_COMMUNITY): Payer: Self-pay

## 2015-12-27 DIAGNOSIS — J449 Chronic obstructive pulmonary disease, unspecified: Secondary | ICD-10-CM | POA: Diagnosis not present

## 2015-12-27 DIAGNOSIS — R6520 Severe sepsis without septic shock: Secondary | ICD-10-CM | POA: Diagnosis not present

## 2015-12-27 DIAGNOSIS — N179 Acute kidney failure, unspecified: Secondary | ICD-10-CM | POA: Insufficient documentation

## 2015-12-27 DIAGNOSIS — Z5181 Encounter for therapeutic drug level monitoring: Secondary | ICD-10-CM

## 2015-12-27 DIAGNOSIS — Z66 Do not resuscitate: Secondary | ICD-10-CM | POA: Insufficient documentation

## 2015-12-27 DIAGNOSIS — N183 Chronic kidney disease, stage 3 (moderate): Secondary | ICD-10-CM | POA: Insufficient documentation

## 2015-12-27 DIAGNOSIS — Z950 Presence of cardiac pacemaker: Secondary | ICD-10-CM | POA: Diagnosis not present

## 2015-12-27 DIAGNOSIS — A419 Sepsis, unspecified organism: Secondary | ICD-10-CM | POA: Diagnosis not present

## 2015-12-27 DIAGNOSIS — Z951 Presence of aortocoronary bypass graft: Secondary | ICD-10-CM | POA: Diagnosis not present

## 2015-12-27 DIAGNOSIS — L03116 Cellulitis of left lower limb: Secondary | ICD-10-CM | POA: Insufficient documentation

## 2015-12-27 DIAGNOSIS — I509 Heart failure, unspecified: Secondary | ICD-10-CM | POA: Insufficient documentation

## 2015-12-27 DIAGNOSIS — I130 Hypertensive heart and chronic kidney disease with heart failure and stage 1 through stage 4 chronic kidney disease, or unspecified chronic kidney disease: Secondary | ICD-10-CM | POA: Insufficient documentation

## 2015-12-27 DIAGNOSIS — I469 Cardiac arrest, cause unspecified: Secondary | ICD-10-CM | POA: Diagnosis not present

## 2015-12-27 DIAGNOSIS — R112 Nausea with vomiting, unspecified: Secondary | ICD-10-CM | POA: Diagnosis present

## 2015-12-27 DIAGNOSIS — Z7901 Long term (current) use of anticoagulants: Secondary | ICD-10-CM | POA: Diagnosis not present

## 2015-12-27 DIAGNOSIS — Z79899 Other long term (current) drug therapy: Secondary | ICD-10-CM | POA: Diagnosis not present

## 2015-12-27 DIAGNOSIS — K668 Other specified disorders of peritoneum: Secondary | ICD-10-CM

## 2015-12-27 DIAGNOSIS — E785 Hyperlipidemia, unspecified: Secondary | ICD-10-CM | POA: Diagnosis not present

## 2015-12-27 DIAGNOSIS — E669 Obesity, unspecified: Secondary | ICD-10-CM | POA: Insufficient documentation

## 2015-12-27 DIAGNOSIS — I4891 Unspecified atrial fibrillation: Secondary | ICD-10-CM | POA: Diagnosis not present

## 2015-12-27 DIAGNOSIS — E872 Acidosis, unspecified: Secondary | ICD-10-CM

## 2015-12-27 DIAGNOSIS — Z85118 Personal history of other malignant neoplasm of bronchus and lung: Secondary | ICD-10-CM | POA: Diagnosis not present

## 2015-12-27 DIAGNOSIS — F0390 Unspecified dementia without behavioral disturbance: Secondary | ICD-10-CM | POA: Insufficient documentation

## 2015-12-27 LAB — CBC WITH DIFFERENTIAL/PLATELET
BASOS ABS: 0 10*3/uL (ref 0.0–0.1)
BASOS PCT: 0 %
Eosinophils Absolute: 0 10*3/uL (ref 0.0–0.7)
Eosinophils Relative: 0 %
HEMATOCRIT: 53.3 % — AB (ref 39.0–52.0)
Hemoglobin: 18 g/dL — ABNORMAL HIGH (ref 13.0–17.0)
LYMPHS PCT: 12 %
Lymphs Abs: 0.9 10*3/uL (ref 0.7–4.0)
MCH: 33.3 pg (ref 26.0–34.0)
MCHC: 33.8 g/dL (ref 30.0–36.0)
MCV: 98.5 fL (ref 78.0–100.0)
Monocytes Absolute: 0.7 10*3/uL (ref 0.1–1.0)
Monocytes Relative: 9 %
NEUTROS ABS: 6 10*3/uL (ref 1.7–7.7)
NEUTROS PCT: 79 %
Platelets: 269 10*3/uL (ref 150–400)
RBC: 5.41 MIL/uL (ref 4.22–5.81)
RDW: 13.9 % (ref 11.5–15.5)
WBC: 7.5 10*3/uL (ref 4.0–10.5)

## 2015-12-27 LAB — COMPREHENSIVE METABOLIC PANEL
ALBUMIN: 3.9 g/dL (ref 3.5–5.0)
ALT: 28 U/L (ref 17–63)
AST: 59 U/L — AB (ref 15–41)
Alkaline Phosphatase: 41 U/L (ref 38–126)
Anion gap: 19 — ABNORMAL HIGH (ref 5–15)
BILIRUBIN TOTAL: 2.3 mg/dL — AB (ref 0.3–1.2)
BUN: 44 mg/dL — AB (ref 6–20)
CHLORIDE: 96 mmol/L — AB (ref 101–111)
CO2: 25 mmol/L (ref 22–32)
CREATININE: 3.08 mg/dL — AB (ref 0.61–1.24)
Calcium: 10.7 mg/dL — ABNORMAL HIGH (ref 8.9–10.3)
GFR calc Af Amer: 19 mL/min — ABNORMAL LOW (ref >60)
GFR, EST NON AFRICAN AMERICAN: 17 mL/min — AB (ref >60)
GLUCOSE: 103 mg/dL — AB (ref 65–99)
Potassium: 4.9 mmol/L (ref 3.5–5.1)
Sodium: 140 mmol/L (ref 135–145)
TOTAL PROTEIN: 7.8 g/dL (ref 6.5–8.1)

## 2015-12-27 LAB — POC OCCULT BLOOD, ED: FECAL OCCULT BLD: POSITIVE — AB

## 2015-12-27 LAB — PROTIME-INR
INR: 1.7 — ABNORMAL HIGH (ref 0.00–1.49)
Prothrombin Time: 20 seconds — ABNORMAL HIGH (ref 11.6–15.2)

## 2015-12-27 LAB — TYPE AND SCREEN
ABO/RH(D): O NEG
Antibody Screen: NEGATIVE

## 2015-12-27 LAB — MAGNESIUM: MAGNESIUM: 2.1 mg/dL (ref 1.7–2.4)

## 2015-12-27 LAB — I-STAT CG4 LACTIC ACID, ED: Lactic Acid, Venous: 6.98 mmol/L (ref 0.5–2.0)

## 2015-12-27 LAB — BRAIN NATRIURETIC PEPTIDE: B Natriuretic Peptide: 1101.6 pg/mL — ABNORMAL HIGH (ref 0.0–100.0)

## 2015-12-27 LAB — I-STAT TROPONIN, ED: TROPONIN I, POC: 0.52 ng/mL — AB (ref 0.00–0.08)

## 2015-12-27 MED ORDER — SODIUM CHLORIDE 0.9 % IV BOLUS (SEPSIS): 1000.0000 mL | Freq: Once | INTRAVENOUS | Status: DC

## 2015-12-27 MED ORDER — DILTIAZEM HCL 25 MG/5ML IV SOLN: 15.0000 mg | Freq: Once | INTRAVENOUS | Status: DC

## 2015-12-27 MED ORDER — EPINEPHRINE HCL 0.1 MG/ML IJ SOSY
PREFILLED_SYRINGE | INTRAMUSCULAR | Status: AC | PRN
  Administered 2015-12-27: 1 mg via INTRAVENOUS

## 2015-12-27 MED ORDER — DILTIAZEM HCL 100 MG IV SOLR: 5.0000 mg/h | Freq: Once | INTRAVENOUS | Status: DC

## 2015-12-27 MED ORDER — PIPERACILLIN-TAZOBACTAM 3.375 G IVPB 30 MIN
3.3750 g | Freq: Once | INTRAVENOUS | Status: AC
Start: 2015-12-27 — End: 2015-12-27
  Administered 2015-12-27: 3.375 g via INTRAVENOUS
  Filled 2015-12-27: qty 50

## 2015-12-27 MED ORDER — DILTIAZEM HCL 25 MG/5ML IV SOLN: 10.0000 mg | Freq: Once | INTRAVENOUS | Status: DC

## 2015-12-27 MED ORDER — SODIUM CHLORIDE 0.9 % IV BOLUS (SEPSIS)
2140.0000 mL | Freq: Once | INTRAVENOUS | Status: AC
  Administered 2015-12-27: 2140 mL via INTRAVENOUS

## 2015-12-27 MED ORDER — VANCOMYCIN HCL 10 G IV SOLR
2000.0000 mg | Freq: Once | INTRAVENOUS | Status: AC
  Administered 2015-12-27: 2000 mg via INTRAVENOUS
  Filled 2015-12-27: qty 2000

## 2015-12-27 MED ORDER — SODIUM CHLORIDE 0.9 % IV BOLUS (SEPSIS)
500.0000 mL | Freq: Once | INTRAVENOUS | Status: AC
  Administered 2015-12-27: 500 mL via INTRAVENOUS

## 2015-12-27 MED ORDER — DEXTROSE 5 % IV SOLN: 5.0000 mg/h | Freq: Once | INTRAVENOUS | Status: DC

## 2015-12-27 MED FILL — Medication: Qty: 1 | Status: AC

## 2016-01-01 LAB — CULTURE, BLOOD (ROUTINE X 2)
Culture: NO GROWTH
Culture: NO GROWTH

## 2016-01-03 ENCOUNTER — Encounter (HOSPITAL_COMMUNITY): Payer: Medicare Other | Admitting: Internal Medicine

## 2016-01-19 NOTE — ED Notes (Signed)
Family at bedside. 

## 2016-01-19 NOTE — Consult Note (Signed)
PULMONARY / CRITICAL CARE MEDICINE   Name: Laurie Lovejoy MRN: [id number] DOB: [birth year]    ADMISSION DATE:  12/30/15 CONSULTATION DATE:  12-30-2015  REFERRING MD: Delora Fuel MD  CHIEF COMPLAINT:  Altered mental status  HISTORY OF PRESENT ILLNESS:   82 yo white male with a PMH of CAD s/p CABG, HTN, HLD, COPD, PVD, chronic a fib on Coumadin, and history of tachybradycardia syndrome s/p medtronic PPM. He presents to the ED today with nausea, vomiting and altered mental status. CXR on arrival showed free under under right diaphragm. Critical care was consulted for evaluation.   PAST MEDICAL HISTORY :  He  has a past medical history of Atrial fibrillation ([place]); Diverticulitis (1975); Hypertension; COPD (chronic obstructive pulmonary disease) ([place]); Pacemaker -Medtronic; Ventricular tachycardia, non-sustained ([place]); Hyperlipidemia; PVD (peripheral vascular disease) ([place]); Obesity; Ichthyosis; Renal insufficiency; Hyperglycemia; H/O echocardiogram; Incisional hernia; Low testosterone; Tachycardia-bradycardia syndrome ([place]); Chronic anticoagulation; CHD (coronary heart disease); Aortic root dilatation (HCC); CHF (congestive heart failure) ([place]); Obesity; Cataract; GERD (gastroesophageal reflux disease); Dysrhythmia; OSA (obstructive sleep apnea); H/O hiatal hernia; Arthritis; Anemia; Malignant neoplasm of lower lobe, bronchus, or lung (11/22/2013); Radiation (5/5,57/,5/11,5/13,5/152015); and Memory loss.  PAST SURGICAL HISTORY: He  has past surgical history that includes broken nose; Pacemaker insertion (02/2008); Coronary artery bypass graft (9371,6967); Cardiac catheterization (2009); Pacemaker insertion (11/14); Colon surgery (85); Back surgery; Hernia repair; Parotidectomy (Left, 12/15/2013); Parotidectomy (Left, 12/15/2013); and Permanent pacemaker generator change (N/A, 06/01/2013).  No Known Allergies  No current facility-administered medications on file prior to encounter.   Current Outpatient  Prescriptions on File Prior to Encounter  Medication Sig  . beta carotene w/minerals (OCUVITE) tablet Take 1 tablet by mouth daily.   [name] diltiazem (CARTIA XT) 180 MG 24 hr capsule Take 180 mg by mouth daily.  [name] doxycycline (VIBRAMYCIN) 100 MG capsule Take 1 capsule (100 mg total) by mouth 2 (two) times daily. One po bid x 7 days  . fenofibrate 54 MG tablet Take 54 mg by mouth Daily.   . ferrous sulfate 325 (65 FE) MG tablet Take 325 mg by mouth daily with breakfast.  . furosemide (LASIX) 80 MG tablet Take 1 tablet (80 mg total) by mouth 2 (two) times daily. (Patient taking differently: Take 40-80 mg by mouth 3 (three) times daily. 80-40-80)  . memantine (NAMENDA) 10 MG tablet Take 1 tablet (10 mg total) by mouth 2 (two) times daily.  . metolazone (ZAROXOLYN) 2.5 MG tablet Take 1 tablet (2.5 mg total) by mouth once a week. On the same day each week.  [name] NITROSTAT 0.4 MG SL tablet Place 0.4 mg under the tongue every 5 (five) minutes as needed for chest pain. Take as directed  . pantoprazole (PROTONIX) 40 MG tablet Take 40 mg by mouth daily.   . potassium chloride (KLOR-CON 10) 10 MEQ CR tablet Take 10 mEq by mouth daily.    [name] SPIRIVA HANDIHALER 18 MCG inhalation capsule INHALE CONTENTS OF 1 CAPSULE BY MOUTH DAILY AS DIRECTED  . SYMBICORT 160-4.5 MCG/ACT inhaler INHALE 2 PUFFS INTO THE LUNGS 2 TIMES DAILY  . warfarin (COUMADIN) 2.5 MG tablet TAKE AS DIRECTED BY THE COUMADIN CLINIC *THIS IS A 90 DAYS SUPPLY*    FAMILY HISTORY:  His indicated that his mother is deceased. He indicated that his father is deceased. He indicated that the status of his sister is unknown. He indicated that both of his brothers are deceased. He indicated that his maternal grandmother is deceased. He indicated that his maternal grandfather is deceased.  He indicated that his paternal grandmother is deceased. He indicated that his paternal grandfather is deceased.   SOCIAL HISTORY: He  reports that he quit smoking about 18  years ago. His smoking use included Cigarettes. He has a 110 pack-year smoking history. He has never used smokeless tobacco. He reports that he drinks alcohol. He reports that he does not use illicit drugs.  REVIEW OF SYSTEMS:   Negative except HPI  SUBJECTIVE:   VITAL SIGNS: BP 96/66 mmHg  Pulse 88  Temp(Src) 97.6 F (36.4 C) (Oral)  Resp 36  SpO2 92%  HEMODYNAMICS:    VENTILATOR SETTINGS:    INTAKE / OUTPUT:    PHYSICAL EXAMINATION: General:  Obese male Neuro:  confused HEENT:  [place]/AT, no JVD, vomitus on chin Cardiovascular:  Tachy Lungs:  Labored, pooled secretions in posterior pharynx Abdomen: Distended, non-tender Musculoskeletal: unable to assess Skin:  Unable to assess  LABS:  BMET  Recent Labs Lab 12/23/15 2300 January 24, 2016 1523  NA 140 140  K 3.2* 4.9  CL 101 96*  CO2 31 25  BUN 32* 44*  CREATININE 1.63* 3.08*  GLUCOSE 100* 103*    Electrolytes  Recent Labs Lab 12/23/15 2300 24-Jan-2016 1523  CALCIUM 9.3 10.7*  MG  --  2.1    CBC  Recent Labs Lab 12/23/15 2300 January 24, 2016 1523  WBC 6.6 7.5  HGB 14.9 18.0*  HCT 43.8 53.3*  PLT 207 269    Coag's  Recent Labs Lab 2016-01-24 1610  INR 1.70*    Sepsis Markers  Recent Labs Lab 01-24-16 1532  LATICACIDVEN 6.98*    ABG No results for input(s): PHART, PCO2ART, PO2ART in the last 168 hours.  Liver Enzymes  Recent Labs Lab 12/23/15 2300 24-Jan-2016 1523  AST 20 59*  ALT 14* 28  ALKPHOS 36* 41  BILITOT 0.8 2.3*  ALBUMIN 3.7 3.9    Cardiac Enzymes No results for input(s): TROPONINI, PROBNP in the last 168 hours.  Glucose No results for input(s): GLUCAP in the last 168 hours.  Imaging Dg Chest Portable 1 View  2016-01-24  CLINICAL DATA:  Fluid overload EXAM: PORTABLE CHEST 1 VIEW COMPARISON:  CT scan 05/21/2015 and chest x-ray 11/24/2012 FINDINGS: Cardiomegaly again noted. Status post median sternotomy. Dual lead cardiac pacemaker is unchanged in position. No infiltrate or  pulmonary edema. Stable scarring and postradiation changes in right upper lobe. Mild left basilar atelectasis. IMPRESSION: No infiltrate or pulmonary edema. Stable scarring and postradiation changes and right upper lobe perihilar region. Mild left basilar atelectasis. Dual lead cardiac pacemaker is unchanged in position. Electronically Signed   By: Lahoma Crocker M.D.   On: January 24, 2016 15:55     STUDIES:  6/8: CT Head---> pending read 6/8: CT abdomen and pelvis --->  CULTURES: 6/8: BC x 2 pending 6/8: Urine Culture pending  ANTIBIOTICS: 6/8: Vancomycin ---> 6/8: Zosyn--->  SIGNIFICANT EVENTS: 6/8: Surgery consulted for evaluation of free air. Discussion had with daughter concerning goals of care. Further care will be determined by surgical evaluation and further discussions with family.   LINES/TUBES: PIV   DISCUSSION: 82 year old male with bowel perforation. Not a good surgical candidate per surgery. Evaluated patient in CT scan where he had significant WOB and emesis on face. Pooled secretions in posterior pharynx and severe cyanosis. Goals of care discussion were in process with daughter, and when updated about his status change she determined full DNR. Shortly after, the patient suffered a cardiac arrest and died.   Georgann Housekeeper, AGACNP-BC [place]  Pulmonology/Critical Care Pager [phone] or [phone]  January 15, 2016 5:41 PM   STAFF NOTE: Linwood Dibbles, MD FACP have personally reviewed patient's available data, including medical history, events of note, physical examination and test results as part of my evaluation. I have discussed with resident/NP and other care providers such as pharmacist, RN and RRT. In addition, I personally evaluated patient and elicited key findings of: on evaluation in CT scanner noted distress, abdo without pain, no r/g, gurgling with clear asp event after ct head done, ronchi on examination, prior to this event I had interviewed the daughter and  gained history of dementia, poor fxnal status and clear wished from the pt to not have heroic care, clinical presentation is that of perf viscous, sepsis, occult sepsis lactic high and now asp event, initial plan was to activate code sepsis, empiric zosyn, vanc, add anidulofungin, now we had extensive discussions after event and DNR DNI established, he progressed fast to death and comfort was provided, full palliation was provided  Lavon Paganini. Titus Mould, MD, [place] Pgr: [facility] 15-Jan-2016 5:41 PM

## 2016-01-19 NOTE — ED Notes (Signed)
Daughter has left to go talk with wife. Daughter has my number and is going to call back if pt can go to morgue.

## 2016-01-19 NOTE — ED Provider Notes (Deleted)
82 year old male presented because of altered mentation. Family was concerned because he had recently been started on metolazone and mental status change seemed to happen after that. He also is on doxycycline for cellulitis of the left lower leg. He has chronic atrial fibrillation for which he is anticoagulated on warfarin. On exam, he is somnolent but arousable and oriented to person but not place or time. Speech other than name is unintelligible. Heart is tachycardic and irregular consistent with atrial fibrillation. Abdomen is obese and soft without apparent tenderness. He has 1+ presacral edema and 2-3+ pretibial edema. Area of cellulitis on the lateral aspect of the left lower leg does not appear really angry. He was started on a septic workup and was found to have markedly elevated lactic acid. Chest x-ray shows free air in the abdomen. Surgery has been consulted. He has been started on appropriate antibiotics.  Radiology actually feels that he does not have free air. He is sent for CT scans. Critical care is been consulted as well. While in CT scan, patient apparently vomited and possibly aspirated and had had a cardiac arrest. CPR was initiated, but critical care informed a set of family decided to make him DO NOT RESUSCITATE so resuscitation efforts were discontinued and he was pronounced dead at 28. Family was present and was notified of patient's death.  Cardiopulmonary Resuscitation (CPR) Procedure Note Directed/Performed by: Delora Fuel I personally directed ancillary staff and/or performed CPR in an effort to regain return of spontaneous circulation and to maintain cardiac, neuro and systemic perfusion.    CRITICAL CARE Performed by: Delora Fuel Total critical care time: 80 minutes Critical care time was exclusive of separately billable procedures and treating other patients. Critical care was necessary to treat or prevent imminent or life-threatening deterioration. Critical care was  time spent personally by me on the following activities: development of treatment plan with patient and/or surrogate as well as nursing, discussions with consultants, evaluation of patient's response to treatment, examination of patient, obtaining history from patient or surrogate, ordering and performing treatments and interventions, ordering and review of laboratory studies, ordering and review of radiographic studies, pulse oximetry and re-evaluation of patient's condition.  I saw and evaluated the patient, reviewed the resident's note and I agree with the findings and plan.   EKG Interpretation   Date/Time:  01-07-2016 14:58:37 EDT Ventricular Rate:  114 PR Interval:    QRS Duration: 150 QT Interval:  372 QTC Calculation: 512 R Axis:   45 Text Interpretation:  Atrial fibrillation Non-specific intra-ventricular  conduction delay Right bundle branch block Left ventricular hypertrophy  Lateral infarct, acute agree. no STEMI Confirmed by Johnney Killian, MD, Jeannie Done  [phone]) on 01/07/16 3:07:58 PM        Delora Fuel, MD 37/94/32 [id number]

## 2016-01-19 NOTE — ED Notes (Signed)
IN CT WITH PT.

## 2016-01-19 NOTE — ED Notes (Signed)
PA with general surgeon at bedside.

## 2016-01-19 NOTE — ED Notes (Signed)
X-ray at bedside

## 2016-01-19 NOTE — ED Notes (Signed)
Pt presents from home with report of increased weakness today.  Pt had lasix increased yesterday, pt is being treated for cellulitis with doxycycline.

## 2016-01-19 NOTE — Telephone Encounter (Signed)
Patient's daughter called to report the patient took 1 Metolazone 24 hours ago and "hasn't been right" since. A few hours after administration, he started throwing up and "looked really bad." His stomach was bloated, he was sluggish and weak, and he had labored breathing with congestion. This morning, she reports the patient felt a little better but his symptoms have returned.  He is nauseous, vomiting, and having labored breathing with "rattling" noises.  She reports his LE cellulitis looks worse. She changed the dressing last night and it "looks really bad." Informed her that it is unlikely the Metolazone would be causing these continuing issues. Instructed her to take patient to the ED for evaluation.  She was grateful for call.

## 2016-01-19 NOTE — Consult Note (Signed)
Pt seen and films reviewed Case discussed with PA,  CCM and his daughter  CXR read by radiology showed signs of CHF but EDP was concerned for free air.  Looking at old films this change was present before.  He denies abdominal pain and had no tenderness to palpation on exam.  He is distended and has been vomiting.He has no signs of peritonitis and has multiple abdominal surgeries.  Recommend CT scan to get a better idea of what is going on and to help decide next step.  He would probably not survive a laparotomy and I have conveyed this to his daughter.  A negative laparotomy in his case could have catastrophic consequences.  Daughter understands that surgery may not be best.  Non operative management could be done since he is stable.  Case discussed with Dr Ninfa Linden who is on call.

## 2016-01-19 NOTE — ED Provider Notes (Signed)
CSN: [id number]     Arrival date & time 01-24-2016  1454 History   First MD Initiated Contact with Patient Jan 24, 2016 1502     Chief Complaint  Patient presents with  . Weakness    Patient is a 82 y.o. male presenting with general illness. The history is provided by the patient and a relative (daughter at bedside).  Illness Location:  Generalized Quality:  Weakness Severity:  Severe Onset quality:  Gradual Duration: Since last night. Timing:  Constant Progression:  Worsening Chronicity:  New Context:  Vomiting "dark" colored material, distended abdomen, cellulitis of the LLE Relieved by:  Nothing Worsened by:  None tried Ineffective treatments:  None tried Associated symptoms: no congestion and no fever     Past Medical History  Diagnosis Date  . Atrial fibrillation (HCC)     coumadin, pacemaker/ CHRONIC  . Diverticulitis 1975  . Hypertension   . COPD (chronic obstructive pulmonary disease) (HCC)     FEV1 1.35/58% 01-11-10  . Pacemaker -Medtronic     Medtronic  . Ventricular tachycardia, non-sustained (HCC)     Detected on the device  . Hyperlipidemia     goal LDL less than 70  . PVD (peripheral vascular disease) ([place])     w mod left external carotid artery stenosis.   . Obesity   . Ichthyosis   . Renal insufficiency     stage III   . Hyperglycemia     mild glucose 111 on 11/2010  . H/O echocardiogram     nl LVF w severe ASSH and mod cLVH, mildly dilated aorta, milf LAE, mild MR/TR and moderate AVSC  . Incisional hernia     2 upper and 1 lower abdominal incisional hernia.   . Low testosterone     start androgel  . Tachycardia-bradycardia syndrome ([place])     s/p PPM  . Chronic anticoagulation   . CHD (coronary heart disease)     s/p CABG with patents grafts at cath 2009  . Aortic root dilatation ([place])   . CHF (congestive heart failure) ([place])   . Obesity   . Cataract      no surgery  . GERD (gastroesophageal reflux disease)   . Dysrhythmia     afib  . OSA  (obstructive sleep apnea)     04-04-08 NPSG AHI 14.5, 3L/M suppl O2 for nadir 86%  . H/O hiatal hernia   . Arthritis   . Anemia     hx  . Malignant neoplasm of lower lobe, bronchus, or lung 11/22/2013  . Radiation 5/5,57/,5/11,5/13,5/152015    right upper lobe 60 Gy in 5 fractions  . Memory loss    Past Surgical History  Procedure Laterality Date  . Broken nose    . Pacemaker insertion  02/2008    medtronic  . Coronary artery bypass graft  [id number]    coronary disease s/p CABG 2004 and Cath 2009  . Cardiac catheterization  2009  . Pacemaker insertion  11/14  . Colon surgery  85    diverticulitis  . Back surgery      herniated disc  . Hernia repair    . Parotidectomy Left 12/15/2013    WITH NECK DISECTION  . Parotidectomy Left 12/15/2013    Procedure: SUPEFICIAL PAROTIDECTOMY WITH FACIAL NERVE DISSECTION;  Surgeon: [name], MD;  Location: [facility];  Service: ENT;  Laterality: Left;  . Permanent pacemaker generator change N/A 06/01/2013    Procedure: PERMANENT PACEMAKER GENERATOR CHANGE;  Surgeon: Champ Mungo  Lovena Le, MD;  Location: [facility] CATH LAB;  Service: Cardiovascular;  Laterality: N/A;   Family History  Problem Relation Age of Onset  . Tuberculosis Father     arrested  . Cancer Father   . Aortic stenosis Mother     ASCVD   Social History  Substance Use Topics  . Smoking status: Former Smoker -- 2.00 packs/day for 55 years    Types: Cigarettes    Quit date: 10/19/1997  . Smokeless tobacco: Never Used  . Alcohol Use: 0.0 oz/week    0 Standard drinks or equivalent per week     Comment: One glass of wine daily.    Review of Systems  Unable to perform ROS: Mental status change  Constitutional: Negative for fever and chills.  HENT: Negative for congestion.   Eyes: Negative for redness.  Gastrointestinal: Positive for abdominal distention.  Neurological: Positive for speech difficulty ("garbled").    Allergies  Review of patient's allergies indicates no known  allergies.  Home Medications   Prior to Admission medications   Medication Sig Start Date End Date Taking? Authorizing Provider  beta carotene w/minerals (OCUVITE) tablet Take 2 tablets by mouth daily.     Historical Provider, MD  Bisacodyl (DULCOLAX PO) Take 1 tablet by mouth daily.    Historical Provider, MD  diltiazem (CARTIA XT) 180 MG 24 hr capsule Take 180 mg by mouth daily.    Historical Provider, MD  doxycycline (VIBRAMYCIN) 100 MG capsule Take 1 capsule (100 mg total) by mouth 2 (two) times daily. One po bid x 7 days 12/24/15   April Palumbo, MD  fenofibrate 54 MG tablet Take 54 mg by mouth Daily.     Historical Provider, MD  ferrous sulfate 325 (65 FE) MG tablet Take 325 mg by mouth daily with breakfast.    Historical Provider, MD  furosemide (LASIX) 80 MG tablet Take 1 tablet (80 mg total) by mouth 2 (two) times daily. 08/15/15   [name], MD  memantine (NAMENDA) 10 MG tablet Take 1 tablet (10 mg total) by mouth 2 (two) times daily. 07/31/15   [name], MD  metolazone (ZAROXOLYN) 2.5 MG tablet Take 1 tablet (2.5 mg total) by mouth once a week. On the same day each week. 12/26/15   Almyra Deforest, PA  NITROSTAT 0.4 MG SL tablet Place 0.4 mg under the tongue every 5 (five) minutes as needed for chest pain. Take as directed    Historical Provider, MD  pantoprazole (PROTONIX) 40 MG tablet Take 40 mg by mouth daily.     Historical Provider, MD  potassium chloride (KLOR-CON 10) 10 MEQ CR tablet Take 10 mEq by mouth daily.      Historical Provider, MD  SPIRIVA HANDIHALER 18 MCG inhalation capsule INHALE CONTENTS OF 1 CAPSULE BY MOUTH DAILY AS DIRECTED 11/26/15   [name], MD  SYMBICORT 160-4.5 MCG/ACT inhaler INHALE 2 PUFFS INTO THE LUNGS 2 TIMES DAILY 02/14/15   [name], MD  warfarin (COUMADIN) 2.5 MG tablet TAKE AS DIRECTED BY THE COUMADIN CLINIC *THIS IS A 90 DAYS SUPPLY* 10/22/15   Sueanne Margarita, MD   BP 131/49 mmHg  Pulse 134  Temp(Src) 97.6 F (36.4 C) (Oral)  Resp 30  SpO2  100% Physical Exam  Constitutional: He appears well-developed and well-nourished. He is cooperative.  HENT:  Head: Normocephalic and atraumatic.  Right Ear: External ear normal.  Left Ear: External ear normal.  Nose: Nose normal.  Mouth/Throat: Mucous membranes are dry.  Neck: Normal range of motion.  Cardiovascular: An irregularly irregular rhythm present. Tachycardia present.   Pulses:      Radial pulses are 2+ on the right side, and 2+ on the left side.       Dorsalis pedis pulses are 2+ on the right side, and 2+ on the left side.  HR 100s-130s; pitting edema bilaterally to the knees  Pulmonary/Chest: Tachypnea noted. He has no decreased breath sounds.  Abdominal: He exhibits distension. There is no tenderness. There is no rebound and no guarding.  Neurological:  Face symmetric, able to clearly state his name, garbled response when asked where he is; follows simple commands, normal strength in major muscle groups of upper and lower extremities  Skin: He is not diaphoretic.     Psychiatric: His speech is delayed.    ED Course  Procedures   Labs Review Labs Reviewed  CBC WITH DIFFERENTIAL/PLATELET - Abnormal; Notable for the following:    Hemoglobin 18.0 (*)    HCT 53.3 (*)    All other components within normal limits  COMPREHENSIVE METABOLIC PANEL - Abnormal; Notable for the following:    Chloride 96 (*)    Glucose, Bld 103 (*)    BUN 44 (*)    Creatinine, Ser 3.08 (*)    Calcium 10.7 (*)    AST 59 (*)    Total Bilirubin 2.3 (*)    GFR calc non Af Amer 17 (*)    GFR calc Af Amer 19 (*)    Anion gap 19 (*)    All other components within normal limits  BRAIN NATRIURETIC PEPTIDE - Abnormal; Notable for the following:    B Natriuretic Peptide 1101.6 (*)    All other components within normal limits  PROTIME-INR - Abnormal; Notable for the following:    Prothrombin Time 20.0 (*)    INR 1.70 (*)    All other components within normal limits  I-STAT CG4 LACTIC ACID, ED -  Abnormal; Notable for the following:    Lactic Acid, Venous 6.98 (*)    All other components within normal limits  I-STAT TROPOININ, ED - Abnormal; Notable for the following:    Troponin i, poc 0.52 (*)    All other components within normal limits  POC OCCULT BLOOD, ED - Abnormal; Notable for the following:    Fecal Occult Bld POSITIVE (*)    All other components within normal limits  CULTURE, BLOOD (ROUTINE X 2)  CULTURE, BLOOD (ROUTINE X 2)  URINE CULTURE  CULTURE, BLOOD (ROUTINE X 2)  CULTURE, BLOOD (ROUTINE X 2)  URINE CULTURE  MAGNESIUM  URINALYSIS, ROUTINE W REFLEX MICROSCOPIC (NOT AT [facility])  COMPREHENSIVE METABOLIC PANEL  CBC WITH DIFFERENTIAL/PLATELET  URINALYSIS, ROUTINE W REFLEX MICROSCOPIC (NOT AT [facility])  I-STAT CG4 LACTIC ACID, ED  TYPE AND SCREEN    Imaging Review Dg Chest Portable 1 View  01/04/2016  CLINICAL DATA:  Fluid overload EXAM: PORTABLE CHEST 1 VIEW COMPARISON:  CT scan 05/21/2015 and chest x-ray 11/24/2012 FINDINGS: Cardiomegaly again noted. Status post median sternotomy. Dual lead cardiac pacemaker is unchanged in position. No infiltrate or pulmonary edema. Stable scarring and postradiation changes in right upper lobe. Mild left basilar atelectasis. IMPRESSION: No infiltrate or pulmonary edema. Stable scarring and postradiation changes and right upper lobe perihilar region. Mild left basilar atelectasis. Dual lead cardiac pacemaker is unchanged in position. Electronically Signed   By: [place] Crocker M.D.   On: January 04, 2016 15:55   I have personally reviewed and evaluated  these images and lab results as part of my medical decision-making. CXR also notable for free air under the diaphragm.    EKG Interpretation   Date/Time:  01-09-2016 14:58:37 EDT Ventricular Rate:  114 PR Interval:    QRS Duration: 150 QT Interval:  372 QTC Calculation: 512 R Axis:   45 Text Interpretation:  Atrial fibrillation Non-specific intra-ventricular  conduction delay Right  bundle branch block Left ventricular hypertrophy  Lateral infarct, acute agree. no STEMI Confirmed by Johnney Killian, MD, Jeannie Done  [phone]) on 01-09-2016 3:07:58 PM      MDM   Final diagnoses:  Intra-abdominal free air of unknown etiology  Lactic acidosis  Anticoagulated on Coumadin  Atrial fibrillation with RVR (HCC)  Severe sepsis (HCC)  Cellulitis of left lower extremity  AKI (acute kidney injury) ([place])  Cardiac arrest ([place])   82 y.o. male with a hx of dementia, a-fib on Coumadin, HTN, HLD, Diverticulitis, CHF who presents with generalized weakness, vomiting, abdominal distension. Began vomiting last night. Has been unable to keep any of his medications down. The emesis has been "dark" since onset.   Lactic acid elevated. Blood pressure began dropping. CODE SEPSIS called. Broad spectrum antibiotics and fluids ordered. He appears overall fluid up with pitting edema, but is likely intravascularly depleted as he has dry mucous membranes, has been vomiting, and has not made any urine since last night per his daughter. His blood pressure showed improvement with IV fluids.   Spoke with general surgery when I saw the CXR, specifically the free air under the diaphragm. They requested a medicine consult. Critical care consulted.   Surgery and critical care evaluated him in the ED.   Labs reviewed by me. Notable for mildly elevated troponin, markedly elevated lactic acid level, AKI, subtherapeutic INR, elevated BNP.   CT Head was ordered. Surgery ordered a CT abd/pelvis.   While in CT, the patient vomited dark brown emesis, lost pulses, and appeared to aspirate. When I walked in to the scanner he was getting chest compressions. Critical care physician arrived after having talked to his daughter, and announced that the patient is DNR. Resuscitation efforts were stopped.  Time of death 17:25. No corneal reflexes, no respirations, no pulse.   Family (daughter) notified of his death. She was present in  the ED.   Case managed in conjunction with my attending, Dr. Roxanne Mins.   Berenice Primas, MD 42/35/36 1443  David Glick, MD 15/40/08 [id number]

## 2016-01-19 NOTE — Telephone Encounter (Signed)
Pt's daughter calling re pt taking Metolazone -1 pills yesterday-now having vomiting-congestion-chest rattling-confusuion--pls call dtr [phone] [name]

## 2016-01-19 NOTE — ED Notes (Signed)
Hemoccult positive. Admitting PA aware.

## 2016-01-19 NOTE — Progress Notes (Signed)
   2016/01/17 1824  Clinical Encounter Type  Visited With Family;Patient;Health care provider  Visit Type Initial;Code;ED;Death  Referral From Nurse  Spiritual Encounters  Spiritual Needs Emotional;Grief support  Stress Factors  Family Stress Factors Loss   Chaplain responded to a code blue in the CT scan area. Patient was just made a DNR so code was stopped. Chaplain met with patient's daughter and son-in-law. Chaplain offered grief support and hospitality. Chaplain relayed patient's next of kin information [employer] [phone]) and funeral home arrangements ([facility] [address]) to patient's RN. Daughter and son-in-law have left to go tell patient's wife, and they may or may not return to visit patient. Spiritual care services available as needed.   Jeri Lager, Chaplain 01-17-16 6:26 PM

## 2016-01-19 NOTE — Consult Note (Signed)
Reason for Consult:Free Air Referring Physician: Dr. Peggye Pitt   [name] Gill is an 82 y.o. male.  HPI: 82 y/o with  Worsening dementia who was treated  12/24/15 with cellulitis LLE with doxycycline.  About 24 hours ago after new diuretic he started having nausea and vomiting.  He has had continued nausea and vomiting thru the day and his daughter brought him to the ED for evaluation.   Here in the ED a chest xray shows what looks like free air under the diaphragm, but was not called that by the Radiology.  There are some old films that look similar.  He is still vomiting a black colored fluid that is Guiaic  Positive for blood.  He remains afebrile, he was tachycardic on admit. Creatinine is up to 3.08, BNP is 1101.6.  Troponin is 0.52.  INR is 1.70. Lactate is 6.98.   Dr. Brantley Stage is not convinced the "free air," is really free air.  We are ordering a CT scan and NG placement. Will continue to evaluate him this PM.    Past Medical History  Diagnosis Date  . Atrial fibrillation (HCC)     coumadin, pacemaker/ CHRONIC  . Diverticulitis 1975  . Hypertension   . COPD (chronic obstructive pulmonary disease) (HCC)     FEV1 1.35/58% 01-11-10  . Pacemaker -Medtronic     Medtronic  . Ventricular tachycardia, non-sustained (HCC)     Detected on the device  . Hyperlipidemia     goal LDL less than 70  . PVD (peripheral vascular disease) ([place])     w mod left external carotid artery stenosis.   . Obesity   . Ichthyosis   . Renal insufficiency     stage III   . Hyperglycemia     mild glucose 111 on 11/2010  . H/O echocardiogram     nl LVF w severe ASSH and mod cLVH, mildly dilated aorta, milf LAE, mild MR/TR and moderate AVSC  . Incisional hernia     2 upper and 1 lower abdominal incisional hernia.   . Low testosterone     start androgel  . Tachycardia-bradycardia syndrome ([place])     s/p PPM  . Chronic anticoagulation   . CHD (coronary heart disease)     s/p CABG with patents grafts at cath  2009  . Aortic root dilatation ([place])   . CHF (congestive heart failure) ([place])   . Obesity   . Cataract      no surgery  . GERD (gastroesophageal reflux disease)   . Dysrhythmia     afib  . OSA (obstructive sleep apnea)     04-04-08 NPSG AHI 14.5, 3L/M suppl O2 for nadir 86%  . H/O hiatal hernia   . Arthritis   . Anemia     hx  . Malignant neoplasm of lower lobe, bronchus, or lung 11/22/2013  . Radiation 5/5,57/,5/11,5/13,5/152015    right upper lobe 60 Gy in 5 fractions  . Memory loss     Past Surgical History  Procedure Laterality Date  . Broken nose    . Pacemaker insertion  02/2008    medtronic  . Coronary artery bypass graft  [id number]    coronary disease s/p CABG 2004 and Cath 2009  . Cardiac catheterization  2009  . Pacemaker insertion  11/14  . Colon surgery  87    diverticulitis  . Back surgery      herniated disc  . Hernia repair    . Parotidectomy Left 12/15/2013  WITH NECK DISECTION  . Parotidectomy Left 12/15/2013    Procedure: SUPEFICIAL PAROTIDECTOMY WITH FACIAL NERVE DISSECTION;  Surgeon: [name], MD;  Location: [facility];  Service: ENT;  Laterality: Left;  . Permanent pacemaker generator change N/A 06/01/2013    Procedure: PERMANENT PACEMAKER GENERATOR CHANGE;  Surgeon: [name], MD;  Location: [facility] CATH LAB;  Service: Cardiovascular;  Laterality: N/A;    Family History  Problem Relation Age of Onset  . Tuberculosis Father     arrested  . Cancer Father   . Aortic stenosis Mother     ASCVD    Social History:  reports that he quit smoking about 18 years ago. His smoking use included Cigarettes. He has a 110 pack-year smoking history. He has never used smokeless tobacco. He reports that he drinks alcohol. He reports that he does not use illicit drugs.  Allergies: No Known Allergies  Prior to Admission medications   Medication Sig Start Date End Date Taking? Authorizing Provider  beta carotene w/minerals (OCUVITE) tablet Take 1 tablet by  mouth daily.    Yes Historical Provider, MD  diltiazem (CARTIA XT) 180 MG 24 hr capsule Take 180 mg by mouth daily.   Yes Historical Provider, MD  doxycycline (VIBRAMYCIN) 100 MG capsule Take 1 capsule (100 mg total) by mouth 2 (two) times daily. One po bid x 7 days 12/24/15  Yes April Palumbo, MD  fenofibrate 54 MG tablet Take 54 mg by mouth Daily.    Yes Historical Provider, MD  ferrous sulfate 325 (65 FE) MG tablet Take 325 mg by mouth daily with breakfast.   Yes Historical Provider, MD  furosemide (LASIX) 80 MG tablet Take 1 tablet (80 mg total) by mouth 2 (two) times daily. Patient taking differently: Take 40-80 mg by mouth 3 (three) times daily. 80-40-80 08/15/15  Yes [name], MD  memantine (NAMENDA) 10 MG tablet Take 1 tablet (10 mg total) by mouth 2 (two) times daily. 07/31/15  Yes [name], MD  metolazone (ZAROXOLYN) 2.5 MG tablet Take 1 tablet (2.5 mg total) by mouth once a week. On the same day each week. 12/26/15  Yes Almyra Deforest, PA  NITROSTAT 0.4 MG SL tablet Place 0.4 mg under the tongue every 5 (five) minutes as needed for chest pain. Take as directed   Yes Historical Provider, MD  pantoprazole (PROTONIX) 40 MG tablet Take 40 mg by mouth daily.    Yes Historical Provider, MD  potassium chloride (KLOR-CON 10) 10 MEQ CR tablet Take 10 mEq by mouth daily.     Yes Historical Provider, MD  SPIRIVA HANDIHALER 18 MCG inhalation capsule INHALE CONTENTS OF 1 CAPSULE BY MOUTH DAILY AS DIRECTED 11/26/15  Yes [name], MD  SYMBICORT 160-4.5 MCG/ACT inhaler INHALE 2 PUFFS INTO THE LUNGS 2 TIMES DAILY 02/14/15  Yes [name], MD  warfarin (COUMADIN) 2.5 MG tablet TAKE AS DIRECTED BY THE COUMADIN CLINIC *THIS IS A 90 DAYS SUPPLY* 10/22/15  Yes Sueanne Margarita, MD     Results for orders placed or performed during the hospital encounter of 01/23/2016 (from the past 48 hour(s))  Type and screen [place]     Status: None (Preliminary result)   Collection Time: 2016-01-23  3:22  PM  Result Value Ref Range   ABO/RH(D) O NEG    Antibody Screen PENDING    Sample Expiration 12/30/2015   CBC with Differential     Status: Abnormal   Collection Time: 01/23/16  3:23 PM  Result Value Ref Range   WBC 7.5 4.0 - 10.5 K/uL   RBC 5.41 4.22 - 5.81 MIL/uL   Hemoglobin 18.0 (H) 13.0 - 17.0 g/dL   HCT 53.3 (H) 39.0 - 52.0 %   MCV 98.5 78.0 - 100.0 fL   MCH 33.3 26.0 - 34.0 pg   MCHC 33.8 30.0 - 36.0 g/dL   RDW 13.9 11.5 - 15.5 %   Platelets 269 150 - 400 K/uL   Neutrophils Relative % 79 %   Neutro Abs 6.0 1.7 - 7.7 K/uL   Lymphocytes Relative 12 %   Lymphs Abs 0.9 0.7 - 4.0 K/uL   Monocytes Relative 9 %   Monocytes Absolute 0.7 0.1 - 1.0 K/uL   Eosinophils Relative 0 %   Eosinophils Absolute 0.0 0.0 - 0.7 K/uL   Basophils Relative 0 %   Basophils Absolute 0.0 0.0 - 0.1 K/uL  Comprehensive metabolic panel     Status: Abnormal   Collection Time: 01-11-2016  3:23 PM  Result Value Ref Range   Sodium 140 135 - 145 mmol/L   Potassium 4.9 3.5 - 5.1 mmol/L   Chloride 96 (L) 101 - 111 mmol/L   CO2 25 22 - 32 mmol/L   Glucose, Bld 103 (H) 65 - 99 mg/dL   BUN 44 (H) 6 - 20 mg/dL   Creatinine, Ser 3.08 (H) 0.61 - 1.24 mg/dL   Calcium 10.7 (H) 8.9 - 10.3 mg/dL   Total Protein 7.8 6.5 - 8.1 g/dL   Albumin 3.9 3.5 - 5.0 g/dL   AST 59 (H) 15 - 41 U/L   ALT 28 17 - 63 U/L   Alkaline Phosphatase 41 38 - 126 U/L   Total Bilirubin 2.3 (H) 0.3 - 1.2 mg/dL   GFR calc non Af Amer 17 (L) >60 mL/min   GFR calc Af Amer 19 (L) >60 mL/min    Comment: (NOTE) The eGFR has been calculated using the CKD EPI equation. This calculation has not been validated in all clinical situations. eGFR's persistently <60 mL/min signify possible Chronic Kidney Disease.    Anion gap 19 (H) 5 - 15  Magnesium     Status: None   Collection Time: 2016-01-11  3:23 PM  Result Value Ref Range   Magnesium 2.1 1.7 - 2.4 mg/dL  Brain natriuretic peptide     Status: Abnormal   Collection Time: Jan 11, 2016  3:23 PM   Result Value Ref Range   B Natriuretic Peptide 1101.6 (H) 0.0 - 100.0 pg/mL  I-Stat CG4 Lactic Acid, ED     Status: Abnormal   Collection Time: 11-Jan-2016  3:32 PM  Result Value Ref Range   Lactic Acid, Venous 6.98 (HH) 0.5 - 2.0 mmol/L   Comment NOTIFIED PHYSICIAN   I-stat troponin, ED     Status: Abnormal   Collection Time: 01/11/16  3:43 PM  Result Value Ref Range   Troponin i, poc 0.52 (HH) 0.00 - 0.08 ng/mL   Comment NOTIFIED PHYSICIAN    Comment 3            Comment: Due to the release kinetics of cTnI, a negative result within the first hours of the onset of symptoms does not rule out myocardial infarction with certainty. If myocardial infarction is still suspected, repeat the test at appropriate intervals.     Dg Chest Portable 1 View  Jan 11, 2016  CLINICAL DATA:  Fluid overload EXAM: PORTABLE CHEST 1 VIEW COMPARISON:  CT scan 05/21/2015 and chest x-ray  11/24/2012 FINDINGS: Cardiomegaly again noted. Status post median sternotomy. Dual lead cardiac pacemaker is unchanged in position. No infiltrate or pulmonary edema. Stable scarring and postradiation changes in right upper lobe. Mild left basilar atelectasis. IMPRESSION: No infiltrate or pulmonary edema. Stable scarring and postradiation changes and right upper lobe perihilar region. Mild left basilar atelectasis. Dual lead cardiac pacemaker is unchanged in position. Electronically Signed   By: Lahoma Crocker M.D.   On: 01-20-16 15:55    Review of Systems  Constitutional: Negative.        He is completely asymptomatic, but he has so much dementia that he does not complain of anything.   All of the history and information is from his daughter.  HENT: Negative.   Respiratory: Negative.   Cardiovascular: Negative.   Gastrointestinal: Positive for nausea and vomiting.  Genitourinary: Negative.   Musculoskeletal: Negative.   Skin: Negative.   Neurological: Negative.   Endo/Heme/Allergies: Negative.   Psychiatric/Behavioral:  Negative.    Blood pressure 116/58, pulse 100, temperature 97.6 F (36.4 C), temperature source Oral, resp. rate 29, SpO2 90 %. Physical Exam  Constitutional: He is oriented to person, place, and time. He appears well-developed. No distress.  Obese WM markedly distended but not having any pain.  He is unaware of any issues and thinks his stomach is normal.    HENT:  Head: Normocephalic and atraumatic.  Eyes: Right eye exhibits no discharge. Left eye exhibits no discharge. No scleral icterus.  Neck: No JVD present. No tracheal deviation present. No thyromegaly present.  Cardiovascular: Normal rate, regular rhythm, normal heart sounds and intact distal pulses.   No murmur heard. Respiratory: Effort normal. No respiratory distress. He has no wheezes. He has rales. He exhibits no tenderness.  Mid sternotomy scar.  GI: He exhibits distension. He exhibits no mass. There is no tenderness. There is no rebound and no guarding.  Markedly distended, multiple scars from prior surgeries.  Fluid collection over mid upper left abdomen palpable. i cannot elicit any pain from him.  Musculoskeletal: He exhibits edema (+2-3 edema both lower legs getting doxycycline for LLE cellulitis).  Neurological: He is alert and oriented to person, place, and time. No cranial nerve deficit.  Skin: Skin is warm and dry. No rash noted. He is not diaphoretic. No erythema. No pallor.  Psychiatric: He has a normal mood and affect. His behavior is normal. Judgment and thought content normal.    Assessment/Plan: Possible free air under the diaphragm  Ongoing nausea, and vomiting, coffee ground, guaiac positive emesis  Worsening progressive dementia  COPD/OSA/RUL CANCER with radiation treatment CAD/s/p CABG/Afib/PTVP/Vtachycardia Chronic anticoagulation CHF Hypertension Acute on chronic renal insuffiencey Multiple back surgeries Prior abdominal surgeries for diverticulitis  Plan:  He is being admitted to [facility], we plan  an NG and then CT scan with oral contrast.  We will follow.  Dr.cornett has discussed with the daughter who is the only one here with him.        Brandon Scarbrough 01/20/2016, 4:39 PM

## 2016-01-19 NOTE — Telephone Encounter (Signed)
Pt's daughter called very upset and concerned about pt.  She states Dr Theodosia Blender office started pt on Metolazone and he took it yesterday around 1 pm.  She reports pt seemed to be "out of it" and was weaker than normal all afternoon.  She states around 8 pm he vomited, after that she states he seemed to look better, however pt continued to vomit all night.  Advised metolazone is a strong medication and he is only to take it once a week, would advise him not to take anymore until seen by our office next week.  Pt is sch to see our office as a new pt on Thur 6/15, he was originally offered an appt for 6/12 but she didn't want that day as she will be out of town for work.  She is now requesting an earlier appt, however we have no openings, advised to keep appt w/us on 6/15 we can call her if someone cancels before then, or she should call Dr Theodosia Blender office back for sooner appt.  Tried to discuss how pt is doing/feeling this AM however she has not went to see him yet so is unsure how he is today.  Advised may need ER eval or to call Dr Theodosia Blender office after she sees him if he is still sick, she is in agreement.

## 2016-01-19 NOTE — ED Notes (Signed)
While in CT with pt upon getting him off of the ct table pt began to vomit dark brown emesis and began having agonal respirations.Pt was being suctioned when he lost pulses. CPR started and zoll pads applied. Pt was being bagged with a BVM when critical care walked into the CT room 3 and stated that family had just decided to make pt a DNR. CPR was stopped. And pt was in a paced rhythm on monitor with no palpable pulse and pt not breathing. Time of death called by Dr. Roxanne Mins at [id number].

## 2016-01-19 NOTE — ED Notes (Signed)
Critical care at bedside  

## 2016-01-19 DEATH — deceased

## 2016-01-24 ENCOUNTER — Telehealth: Payer: Self-pay

## 2016-01-24 NOTE — Telephone Encounter (Signed)
-----   Message from [employer] sent at 01/24/2016  3:06 PM EDT ----- Valetta Fuller,   Daughter calling asking for return call from you or Dr.Turner concerning medication father was on in hospital and shortly after taking it he passed.  Please call back.   Daughter: Wilford Sports- [phone]  Thanks, Maudie Mercury

## 2016-01-24 NOTE — Telephone Encounter (Signed)
Patient's daughter wanted to report that it seems "cause and effect" that her dad died shortly after starting Metolazone.  She wants to make the drug "as safe as possible" and "do the necessary reporting to the CDC." She states that the patient ultimately died from a perforated bowel, and it could have been caused by the medication. Informed her that it this is an unlikely correlation - more of a coincidence.  She requests a call from the pharmacist tomorrow to further discuss the medication and to see if any reports should be made "for the safety of patients."

## 2016-01-25 NOTE — Telephone Encounter (Signed)
Metolazone would not have resulted in bowel perforation.  In review of hospital records patient had been vomiting and appears that he vomited in the CT scanner and then arrested.

## 2016-01-25 NOTE — Telephone Encounter (Signed)
Spoke with pt's daughter.  Discussed mechanism of action and potential side effects of metolazone.  I could not find any reports of bowel perforation and do not think the two are related in this case.  She was thankful for the information.  Gave her the information for FDA medwatch in case she wanted to report the incident herself.

## 2016-01-28 ENCOUNTER — Ambulatory Visit: Payer: Medicare Other | Admitting: Neurology

## 2016-02-27 ENCOUNTER — Ambulatory Visit: Payer: Medicare Other | Admitting: Podiatry

## 2016-09-09 ENCOUNTER — Ambulatory Visit: Payer: Medicare Other | Admitting: Internal Medicine
